# Patient Record
Sex: Female | Born: 1955
Health system: Southern US, Community
[De-identification: ages and names within clinical notes are randomized; demographics above are authoritative.]

## PROBLEM LIST (undated history)

## (undated) DIAGNOSIS — G8929 Other chronic pain: Secondary | ICD-10-CM

## (undated) DIAGNOSIS — K6389 Other specified diseases of intestine: Secondary | ICD-10-CM

## (undated) DIAGNOSIS — K589 Irritable bowel syndrome without diarrhea: Secondary | ICD-10-CM

## (undated) DIAGNOSIS — E7800 Pure hypercholesterolemia, unspecified: Secondary | ICD-10-CM

## (undated) DIAGNOSIS — K859 Acute pancreatitis without necrosis or infection, unspecified: Secondary | ICD-10-CM

## (undated) DIAGNOSIS — R51 Headache: Secondary | ICD-10-CM

## (undated) DIAGNOSIS — E538 Deficiency of other specified B group vitamins: Secondary | ICD-10-CM

## (undated) DIAGNOSIS — R519 Headache, unspecified: Secondary | ICD-10-CM

## (undated) DIAGNOSIS — D649 Anemia, unspecified: Secondary | ICD-10-CM

## (undated) DIAGNOSIS — F0390 Unspecified dementia without behavioral disturbance: Secondary | ICD-10-CM

## (undated) DIAGNOSIS — F32A Depression, unspecified: Secondary | ICD-10-CM

## (undated) DIAGNOSIS — I10 Essential (primary) hypertension: Secondary | ICD-10-CM

## (undated) DIAGNOSIS — M1990 Unspecified osteoarthritis, unspecified site: Secondary | ICD-10-CM

## (undated) DIAGNOSIS — Z800 Family history of malignant neoplasm of digestive organs: Secondary | ICD-10-CM

## (undated) DIAGNOSIS — F329 Major depressive disorder, single episode, unspecified: Secondary | ICD-10-CM

## (undated) HISTORY — DX: Anemia, unspecified: D64.9

## (undated) HISTORY — DX: Other specified diseases of intestine: K63.89

## (undated) HISTORY — DX: Major depressive disorder, single episode, unspecified: F32.9

## (undated) HISTORY — DX: Irritable bowel syndrome, unspecified: K58.9

## (undated) HISTORY — PX: ABDOMINAL HYSTERECTOMY: SHX81

## (undated) HISTORY — PX: APPENDECTOMY: SHX54

## (undated) HISTORY — DX: Depression, unspecified: F32.A

## (undated) HISTORY — DX: Other chronic pain: G89.29

## (undated) HISTORY — DX: Pure hypercholesterolemia, unspecified: E78.00

## (undated) HISTORY — DX: Family history of malignant neoplasm of digestive organs: Z80.0

## (undated) HISTORY — DX: Acute pancreatitis without necrosis or infection, unspecified: K85.90

## (undated) HISTORY — DX: Deficiency of other specified B group vitamins: E53.8

## (undated) HISTORY — DX: Headache, unspecified: R51.9

## (undated) HISTORY — DX: Essential (primary) hypertension: I10

## (undated) HISTORY — DX: Unspecified osteoarthritis, unspecified site: M19.90

## (undated) HISTORY — DX: Headache: R51

---

## 1999-04-26 ENCOUNTER — Encounter: Payer: Self-pay | Admitting: Internal Medicine

## 1999-04-26 ENCOUNTER — Ambulatory Visit (HOSPITAL_COMMUNITY): Admission: RE | Admit: 1999-04-26 | Discharge: 1999-04-26 | Payer: Self-pay | Admitting: Internal Medicine

## 1999-05-02 ENCOUNTER — Ambulatory Visit (HOSPITAL_COMMUNITY): Admission: RE | Admit: 1999-05-02 | Discharge: 1999-05-02 | Payer: Self-pay | Admitting: Internal Medicine

## 1999-05-02 ENCOUNTER — Encounter: Payer: Self-pay | Admitting: Internal Medicine

## 2000-10-08 ENCOUNTER — Ambulatory Visit (HOSPITAL_COMMUNITY): Admission: RE | Admit: 2000-10-08 | Discharge: 2000-10-08 | Payer: Self-pay | Admitting: Internal Medicine

## 2000-10-08 ENCOUNTER — Encounter: Payer: Self-pay | Admitting: Internal Medicine

## 2002-04-22 ENCOUNTER — Encounter: Admission: RE | Admit: 2002-04-22 | Discharge: 2002-04-22 | Payer: Self-pay | Admitting: Internal Medicine

## 2002-04-22 ENCOUNTER — Encounter: Payer: Self-pay | Admitting: Internal Medicine

## 2002-05-19 ENCOUNTER — Ambulatory Visit (HOSPITAL_COMMUNITY): Admission: RE | Admit: 2002-05-19 | Discharge: 2002-05-19 | Payer: Self-pay | Admitting: Internal Medicine

## 2002-05-19 ENCOUNTER — Encounter: Payer: Self-pay | Admitting: Internal Medicine

## 2003-06-07 ENCOUNTER — Emergency Department (HOSPITAL_COMMUNITY): Admission: AD | Admit: 2003-06-07 | Discharge: 2003-06-07 | Payer: Self-pay | Admitting: Family Medicine

## 2003-06-24 ENCOUNTER — Ambulatory Visit (HOSPITAL_COMMUNITY): Admission: RE | Admit: 2003-06-24 | Discharge: 2003-06-24 | Payer: Self-pay | Admitting: Internal Medicine

## 2004-02-12 ENCOUNTER — Encounter: Admission: RE | Admit: 2004-02-12 | Discharge: 2004-02-12 | Payer: Self-pay | Admitting: Internal Medicine

## 2004-12-05 ENCOUNTER — Ambulatory Visit (HOSPITAL_COMMUNITY): Admission: RE | Admit: 2004-12-05 | Discharge: 2004-12-05 | Payer: Self-pay | Admitting: Internal Medicine

## 2004-12-08 ENCOUNTER — Ambulatory Visit (HOSPITAL_COMMUNITY): Admission: RE | Admit: 2004-12-08 | Discharge: 2004-12-08 | Payer: Self-pay | Admitting: Neurosurgery

## 2004-12-12 ENCOUNTER — Encounter: Admission: RE | Admit: 2004-12-12 | Discharge: 2005-01-13 | Payer: Self-pay | Admitting: Neurosurgery

## 2006-02-13 ENCOUNTER — Ambulatory Visit (HOSPITAL_COMMUNITY): Admission: RE | Admit: 2006-02-13 | Discharge: 2006-02-13 | Payer: Self-pay | Admitting: Internal Medicine

## 2006-02-21 ENCOUNTER — Encounter: Admission: RE | Admit: 2006-02-21 | Discharge: 2006-02-21 | Payer: Self-pay | Admitting: Internal Medicine

## 2006-02-27 ENCOUNTER — Ambulatory Visit: Payer: Self-pay | Admitting: Gastroenterology

## 2006-02-28 ENCOUNTER — Ambulatory Visit: Payer: Self-pay | Admitting: Gastroenterology

## 2006-02-28 ENCOUNTER — Encounter: Payer: Self-pay | Admitting: Gastroenterology

## 2006-03-02 ENCOUNTER — Ambulatory Visit: Payer: Self-pay | Admitting: Gastroenterology

## 2006-03-09 ENCOUNTER — Ambulatory Visit: Payer: Self-pay | Admitting: Gastroenterology

## 2006-03-14 ENCOUNTER — Ambulatory Visit: Payer: Self-pay | Admitting: Gastroenterology

## 2006-03-16 ENCOUNTER — Ambulatory Visit: Payer: Self-pay | Admitting: Gastroenterology

## 2006-03-23 ENCOUNTER — Ambulatory Visit: Payer: Self-pay | Admitting: Gastroenterology

## 2006-04-13 ENCOUNTER — Ambulatory Visit: Payer: Self-pay | Admitting: Gastroenterology

## 2006-04-17 ENCOUNTER — Ambulatory Visit (HOSPITAL_COMMUNITY): Admission: RE | Admit: 2006-04-17 | Discharge: 2006-04-17 | Payer: Self-pay | Admitting: Gastroenterology

## 2006-05-29 ENCOUNTER — Ambulatory Visit: Payer: Self-pay | Admitting: Gastroenterology

## 2007-09-19 ENCOUNTER — Ambulatory Visit (HOSPITAL_COMMUNITY): Admission: RE | Admit: 2007-09-19 | Discharge: 2007-09-19 | Payer: Self-pay | Admitting: Internal Medicine

## 2007-09-20 ENCOUNTER — Ambulatory Visit (HOSPITAL_COMMUNITY): Admission: RE | Admit: 2007-09-20 | Discharge: 2007-09-20 | Payer: Self-pay | Admitting: Internal Medicine

## 2008-06-01 ENCOUNTER — Ambulatory Visit (HOSPITAL_COMMUNITY): Admission: RE | Admit: 2008-06-01 | Discharge: 2008-06-01 | Payer: Self-pay | Admitting: Internal Medicine

## 2008-08-27 ENCOUNTER — Emergency Department (HOSPITAL_COMMUNITY): Admission: EM | Admit: 2008-08-27 | Discharge: 2008-08-28 | Payer: Self-pay | Admitting: *Deleted

## 2008-10-22 ENCOUNTER — Ambulatory Visit (HOSPITAL_COMMUNITY): Admission: RE | Admit: 2008-10-22 | Discharge: 2008-10-22 | Payer: Self-pay | Admitting: Internal Medicine

## 2009-05-20 ENCOUNTER — Emergency Department (HOSPITAL_COMMUNITY): Admission: EM | Admit: 2009-05-20 | Discharge: 2009-05-20 | Payer: Self-pay | Admitting: Family Medicine

## 2009-10-11 ENCOUNTER — Telehealth: Payer: Self-pay | Admitting: Gastroenterology

## 2009-10-11 ENCOUNTER — Ambulatory Visit: Payer: Self-pay | Admitting: Gastroenterology

## 2009-10-11 DIAGNOSIS — E538 Deficiency of other specified B group vitamins: Secondary | ICD-10-CM

## 2009-10-11 DIAGNOSIS — E785 Hyperlipidemia, unspecified: Secondary | ICD-10-CM

## 2009-10-11 DIAGNOSIS — K219 Gastro-esophageal reflux disease without esophagitis: Secondary | ICD-10-CM

## 2009-10-11 DIAGNOSIS — M1990 Unspecified osteoarthritis, unspecified site: Secondary | ICD-10-CM | POA: Insufficient documentation

## 2009-10-11 DIAGNOSIS — K5730 Diverticulosis of large intestine without perforation or abscess without bleeding: Secondary | ICD-10-CM

## 2009-10-11 DIAGNOSIS — I10 Essential (primary) hypertension: Secondary | ICD-10-CM | POA: Insufficient documentation

## 2009-10-12 ENCOUNTER — Telehealth: Payer: Self-pay | Admitting: Physician Assistant

## 2009-10-12 LAB — CONVERTED CEMR LAB
BUN: 26 mg/dL — ABNORMAL HIGH (ref 6–23)
CO2: 31 meq/L (ref 19–32)
Calcium: 9 mg/dL (ref 8.4–10.5)
Eosinophils Relative: 3.3 % (ref 0.0–5.0)
Glucose, Bld: 98 mg/dL (ref 70–99)
Lymphs Abs: 1.9 10*3/uL (ref 0.7–4.0)
MCHC: 32.9 g/dL (ref 30.0–36.0)
Monocytes Absolute: 0.5 10*3/uL (ref 0.1–1.0)
Neutro Abs: 5 10*3/uL (ref 1.4–7.7)
Platelets: 249 10*3/uL (ref 150.0–400.0)
Potassium: 4.2 meq/L (ref 3.5–5.1)
RDW: 12.1 % (ref 11.5–14.6)
WBC: 7.7 10*3/uL (ref 4.5–10.5)

## 2009-10-13 ENCOUNTER — Ambulatory Visit (HOSPITAL_COMMUNITY): Admission: RE | Admit: 2009-10-13 | Discharge: 2009-10-13 | Payer: Self-pay | Admitting: Gastroenterology

## 2009-10-18 ENCOUNTER — Telehealth: Payer: Self-pay | Admitting: Gastroenterology

## 2009-10-28 ENCOUNTER — Ambulatory Visit (HOSPITAL_COMMUNITY): Admission: RE | Admit: 2009-10-28 | Discharge: 2009-10-28 | Payer: Self-pay | Admitting: Internal Medicine

## 2009-11-23 ENCOUNTER — Encounter (INDEPENDENT_AMBULATORY_CARE_PROVIDER_SITE_OTHER): Payer: Self-pay | Admitting: *Deleted

## 2009-12-21 ENCOUNTER — Ambulatory Visit: Payer: Self-pay | Admitting: Gastroenterology

## 2009-12-21 ENCOUNTER — Encounter (INDEPENDENT_AMBULATORY_CARE_PROVIDER_SITE_OTHER): Payer: Self-pay | Admitting: *Deleted

## 2009-12-21 LAB — CONVERTED CEMR LAB
ALT: 22 units/L (ref 0–35)
Albumin: 3.8 g/dL (ref 3.5–5.2)
Basophils Absolute: 0.1 10*3/uL (ref 0.0–0.1)
Basophils Relative: 0.8 % (ref 0.0–3.0)
CO2: 31 meq/L (ref 19–32)
Calcium: 9.5 mg/dL (ref 8.4–10.5)
Creatinine, Ser: 1.1 mg/dL (ref 0.4–1.2)
Eosinophils Absolute: 0.3 10*3/uL (ref 0.0–0.7)
Eosinophils Relative: 5.2 % — ABNORMAL HIGH (ref 0.0–5.0)
Ferritin: 88.9 ng/mL (ref 10.0–291.0)
HCT: 38.7 % (ref 36.0–46.0)
Lymphocytes Relative: 24.5 % (ref 12.0–46.0)
Monocytes Absolute: 0.5 10*3/uL (ref 0.1–1.0)
Monocytes Relative: 7 % (ref 3.0–12.0)
Neutrophils Relative %: 62.5 % (ref 43.0–77.0)
Potassium: 4.3 meq/L (ref 3.5–5.1)
RDW: 13.7 % (ref 11.5–14.6)
Sed Rate: 29 mm/hr — ABNORMAL HIGH (ref 0–22)
Sodium: 141 meq/L (ref 135–145)
TSH: 1.03 microintl units/mL (ref 0.35–5.50)
Total Bilirubin: 0.4 mg/dL (ref 0.3–1.2)
WBC: 6.8 10*3/uL (ref 4.5–10.5)

## 2009-12-27 ENCOUNTER — Ambulatory Visit: Payer: Self-pay | Admitting: Gastroenterology

## 2009-12-29 ENCOUNTER — Encounter: Payer: Self-pay | Admitting: Gastroenterology

## 2010-01-25 ENCOUNTER — Ambulatory Visit: Payer: Self-pay | Admitting: Gastroenterology

## 2010-01-25 DIAGNOSIS — D509 Iron deficiency anemia, unspecified: Secondary | ICD-10-CM | POA: Insufficient documentation

## 2010-02-24 ENCOUNTER — Ambulatory Visit: Payer: Self-pay | Admitting: Gastroenterology

## 2010-03-02 LAB — CONVERTED CEMR LAB
Basophils Absolute: 0 10*3/uL (ref 0.0–0.1)
Iron: 35 ug/dL — ABNORMAL LOW (ref 42–145)
Lymphocytes Relative: 25.8 % (ref 12.0–46.0)
MCHC: 34.4 g/dL (ref 30.0–36.0)
Monocytes Relative: 7.6 % (ref 3.0–12.0)
Neutrophils Relative %: 56.9 % (ref 43.0–77.0)
Platelets: 233 10*3/uL (ref 150.0–400.0)
RBC: 3.95 M/uL (ref 3.87–5.11)
Saturation Ratios: 14.1 % — ABNORMAL LOW (ref 20.0–50.0)
Transferrin: 177.3 mg/dL — ABNORMAL LOW (ref 212.0–360.0)
WBC: 6.1 10*3/uL (ref 4.5–10.5)

## 2010-07-01 ENCOUNTER — Encounter
Admission: RE | Admit: 2010-07-01 | Discharge: 2010-07-01 | Payer: Self-pay | Source: Home / Self Care | Attending: Internal Medicine | Admitting: Internal Medicine

## 2010-08-25 NOTE — Procedures (Signed)
Summary: Colonoscopy  Patient: [name] Note: All result statuses are Final unless otherwise noted.  Tests: (1) Colonoscopy (COL)   COL Colonoscopy           DONE     [facility]     [address]     [place], [place]  [address]           COLONOSCOPY PROCEDURE REPORT           PATIENT:  Helen Cabrera, Helen Cabrera  MR#:  [id number]     BIRTHDATE:  03-23-1956, 53 yrs. old  GENDER:  female     ENDOSCOPIST:  Vania Rea. [name], MD, [facility]     REF. BY:     PROCEDURE DATE:  12/27/2009     PROCEDURE:  Colonoscopy with biopsy     ASA CLASS:  Class II     INDICATIONS:  abdominal pain, unexplained diarrhea, weight loss     MEDICATIONS:   Fentanyl 75 mcg IV, Versed 8 mg IV           DESCRIPTION OF PROCEDURE:   After the risks benefits and     alternatives of the procedure were thoroughly explained, informed     consent was obtained.  Digital rectal exam was performed and     revealed no abnormalities.   The LB [id number] endoscope     was introduced through the anus and advanced to the cecum, which     was identified by both the appendix and ileocecal valve, without     limitations.  The quality of the prep was excellent, using     MoviPrep.  The instrument was then slowly withdrawn as the colon     was fully examined.     <<PROCEDUREIMAGES>>           FINDINGS:  Scattered diverticula were found in the sigmoid to     descending colon segments.  No polyps or cancers were seen. random     biopsies done.   Retroflexed views in the rectum revealed no     abnormalities.    The scope was then withdrawn from the patient     and the procedure completed.           COMPLICATIONS:  None     ENDOSCOPIC IMPRESSION:     1) Diverticula, scattered in the sigmoid to descending colon     segments     2) No polyps or cancers     probable IBS.     RECOMMENDATIONS:     1) Upper endoscopy will be scheduled     2) Await biopsy results     REPEAT EXAM:  No        ______________________________     Vania Rea. [name], MD, Clementeen Graham           CC:  Lucky Cowboy, MD           n.     Rosalie Doctor:   Vania Rea. Jordon Kristiansen at 12/27/2009 03:52 PM           Darlys Gales, [id number]  Note: An exclamation mark (!) indicates a result that was not dispersed into the flowsheet. Document Creation Date: 12/27/2009 3:52 PM _______________________________________________________________________  (1) Order result status: Final Collection or observation date-time: 12/27/2009 15:46 Requested date-time:  Receipt date-time:  Reported date-time:  Referring Physician:   Ordering Physician: Sheryn Bison [phone]) Specimen Source:  Source: Launa Grill Order Number: [id number] Lab site:

## 2010-08-25 NOTE — Letter (Signed)
Summary: [facility] Instructions  [facility]  [address] [place], [place] [address]   Phone: [phone]  Fax: [phone]       Helen Cabrera    Nov 11, 1955    MRN: [id number]        Procedure Day /Date: Monday,  12/27/09     Arrival Time: 2:30       Procedure Time: 3:30     Location of Procedure:                    [facility] (4th Floor)                        PREPARATION FOR COLONOSCOPY WITH MOVIPREP   Starting 5 days prior to your procedure 12/22/09 do not eat nuts, seeds, popcorn, corn, beans, peas,  salads, or any raw vegetables.  Do not take any fiber supplements (e.g. Metamucil, Citrucel, and Benefiber).  THE DAY BEFORE YOUR PROCEDURE         DATE: 12/26/09  DAY: Sunday  1.  Drink clear liquids the entire day-NO SOLID FOOD  2.  Do not drink anything colored red or purple.  Avoid juices with pulp.  No orange juice.  3.  Drink at least 64 oz. (8 glasses) of fluid/clear liquids during the day to prevent dehydration and help the prep work efficiently.  CLEAR LIQUIDS INCLUDE: Water Jello Ice Popsicles Tea (sugar ok, no milk/cream) Powdered fruit flavored drinks Coffee (sugar ok, no milk/cream) Gatorade Juice: apple, white grape, white cranberry  Lemonade Clear bullion, consomm, broth Carbonated beverages (any kind) Strained chicken noodle soup Hard Candy                             4.  In the morning, mix first dose of MoviPrep solution:    Empty 1 Pouch A and 1 Pouch B into the disposable container    Add lukewarm drinking water to the top line of the container. Mix to dissolve    Refrigerate (mixed solution should be used within 24 hrs)  5.  Begin drinking the prep at 5:00 p.m. The MoviPrep container is divided by 4 marks.   Every 15 minutes drink the solution down to the next mark (approximately 8 oz) until the full liter is complete.   6.  Follow completed prep with 16 oz of clear liquid of your choice (Nothing red or  purple).  Continue to drink clear liquids until bedtime.  7.  Before going to bed, mix second dose of MoviPrep solution:    Empty 1 Pouch A and 1 Pouch B into the disposable container    Add lukewarm drinking water to the top line of the container. Mix to dissolve    Refrigerate  THE DAY OF YOUR PROCEDURE      DATE: 12/27/09  DAY: Monday  Beginning at 10:30 a.m. (5 hours before procedure):         1. Every 15 minutes, drink the solution down to the next mark (approx 8 oz) until the full liter is complete.  2. Follow completed prep with 16 oz. of clear liquid of your choice.    3. You may drink clear liquids until 1:30 (2 HOURS BEFORE PROCEDURE).   MEDICATION INSTRUCTIONS  Unless otherwise instructed, you should take regular prescription medications with a small sip of water   as early as possible the morning  of your procedure.                  OTHER INSTRUCTIONS  You will need a responsible adult at least 56 years of age to accompany you and drive you home.   This person must remain in the waiting room during your procedure.  Wear loose fitting clothing that is easily removed.  Leave jewelry and other valuables at home.  However, you may wish to bring a book to read or  an iPod/MP3 player to listen to music as you wait for your procedure to start.  Remove all body piercing jewelry and leave at home.  Total time from sign-in until discharge is approximately 2-3 hours.  You should go home directly after your procedure and rest.  You can resume normal activities the  day after your procedure.  The day of your procedure you should not:   Drive   Make legal decisions   Operate machinery   Drink alcohol   Return to work  You will receive specific instructions about eating, activities and medications before you leave.    The above instructions have been reviewed and explained to me by   _______________________    I fully understand and can verbalize  these instructions _____________________________ Date _________

## 2010-08-25 NOTE — Assessment & Plan Note (Signed)
Summary: nauseated/abd pain--ch.   History of Present Illness Visit Type: Follow-up Visit Primary GI MD: Sheryn Bison MD FACP FAGA Primary Provider: Lucky Cowboy, MD Chief Complaint: abdominal pain and nausea for several weeks, pt was seen in March and given phenergan which she states helps, but makes her legs ache and makes her restless so she does not take, symptoms eased up a bit, but have come back History of Present Illness:   Pleasant 55 year old Caucasian female with a past history of chronic IBS now presents with 6 months of discomfort over the entire abdomen-clonic area which is a crampy type pain and also a chronic dull aching discomfort with associated nausea, all worse with being fried or spicy foods. She also complains of early satiety but no emesis. Her bowel movements are fairly regular, but she has had one episode of hematochezia. She denies melanotic stools. Meds include meloxicam at that 50 mg daily for low back pain, Ultram, with 2 hyosamine tablets twice a day which gave her mild improvement. She has abdominal gas and bloating in the last 20 pound weight loss. She gives no history of pancreatitis, hepatitis, abuse of alcohol or cigarettes.  Recent abdominal ultrasound showed no evidence of cholelithiasis, but the pancreas is poorly visualized. She is on daily Nexium 40 mg a day, B12 shots, and Klonopin 0.5 mg t.i.d. for chronic anxiety syndrome. She denies severe anxiety or depression at this point. Previous endoscopy colonoscopy were done 4 years ago. Noted is a family history of gastric carcinoma and recurrent colon polyps. Previous small bowel series was completed in September 2007 was unremarkable. Patient has had multiple cesarean operations. She thinks she also has had appendectomy.   GI Review of Systems    Reports abdominal pain, acid reflux, belching, heartburn, loss of appetite, and  nausea.     Location of  Abdominal pain: generalized.    Denies chest pain,  dysphagia with liquids, dysphagia with solids, vomiting, vomiting blood, weight loss, and  weight gain.      Reports irritable bowel syndrome.     Denies anal fissure, black tarry stools, change in bowel habit, constipation, diarrhea, diverticulosis, fecal incontinence, heme positive stool, hemorrhoids, jaundice, light color stool, liver problems, rectal bleeding, and  rectal pain.    Current Medications (verified): 1)  Lipitor 80 Mg Tabs (Atorvastatin Calcium) .... Take 1 Tablet By Mouth Once A Day 2)  Hydrochlorothiazide 25 Mg Tabs (Hydrochlorothiazide) .... Take 1 Tablet By Mouth Once A Day 3)  Atenolol 100 Mg Tabs (Atenolol) .... Take 1 Tablet By Mouth Once A Day 4)  Nexium 40 Mg Cpdr (Esomeprazole Magnesium) .... Take 1 Tablet By Mouth Once A Day 5)  Meloxicam 15 Mg Tabs (Meloxicam) .... Take 1 Tablet By Mouth Once A Day 6)  Klonopin 1 Mg Tabs (Clonazepam) .... Take 1/2 Tablet By Mouth Three Times A Day 7)  Cyanocobalamin 1000 Mcg/ml Soln (Cyanocobalamin) .... Inject 1 Ml Im Every 2 Weeks 8)  Vitamin D (125 Mg) .... Take 1 Tablet Every Monday, Tuesday and Wednesday 9)  Magnesium/calcium/zinc Tablets (400 Mg/1000mg /15 Mg) .... Take 1 Tablet By Mouth Once A Day 10)  Ultram 50 Mg Tabs (Tramadol Hcl) .... Take 1 Tablet By Mouth Every 6 Hours As Needed 11)  Promethazine Hcl 25 Mg Tabs (Promethazine Hcl) .... Take 1/2-1 Tablet By Mouth Every 6 Hours As Needed 12)  Hyoscyamine Sulfate Cr 0.375 Mg Xr12h-Tab (Hyoscyamine Sulfate) .... Take 2 Tablets By Mouth Two Times A Day  Allergies (verified): No  Known Drug Allergies  Past History:  Past medical history reviewed for relevance to current acute and chronic problems.  Past Medical History: Reviewed history from 10/11/2009 and no changes required. GERD IBS OSTEOARTHRITIS HTN HYPERLIPIDEMIA  Past Surgical History: Reviewed history from 10/11/2009 and no changes required. C-Section x 4 Appendectomy  Family History: Reviewed history  from 10/11/2009 and no changes required. Family History of Colon Cancer: Maternal Aunt Family History of Breast Cancer: Aunt Family History of Esophageal Cancer: Brother (in his 46's) Family History of Stomach Cancer:Brother Family History of Diabetes: Mother, Brother, Maternal Uncle Family History of Heart Disease: P, M Grandfather, P, M, Grandmother  Social History: Married, 2 boys, 2 girls [employer] Patient has never smoked.  Alcohol Use - yes-rare Daily Caffeine Use-minimal Illicit Drug Use - no Patient does not get regular exercise.   Review of Systems       The patient complains of back pain, fatigue, and night sweats.  The patient denies allergy/sinus, anemia, anxiety-new, arthritis/joint pain, blood in urine, breast changes/lumps, change in vision, confusion, cough, coughing up blood, depression-new, fainting, fever, headaches-new, hearing problems, heart murmur, heart rhythm changes, itching, menstrual pain, muscle pains/cramps, nosebleeds, pregnancy symptoms, shortness of breath, skin rash, sleeping problems, sore throat, swelling of feet/legs, swollen lymph glands, thirst - excessive, urination - excessive, urination changes/pain, urine leakage, and voice change.         history positive for chronic insomnia. She has had rather severe dry mouth associated with anticholinergic medications. Eyes:  Denies blurring, diplopia, irritation, discharge, vision loss, scotoma, eye pain, and photophobia. ENT:  Denies earache, ear discharge, tinnitus, decreased hearing, nasal congestion, loss of smell, nosebleeds, sore throat, hoarseness, and difficulty swallowing. CV:  Denies chest pains, angina, palpitations, syncope, dyspnea on exertion, orthopnea, PND, peripheral edema, and claudication. GI:  Complains of nausea and abdominal pain; denies difficulty swallowing, pain on swallowing, indigestion/heartburn, vomiting, vomiting blood, jaundice, gas/bloating, diarrhea, constipation, change in  bowel habits, bloody BM's, black BMs, and fecal incontinence. GU:  Denies urinary burning, blood in urine, nocturnal urination, urinary frequency, urinary incontinence, abnormal vaginal bleeding, amenorrhea, menorrhagia, vaginal discharge, pelvic pain, genital sores, painful intercourse, and decreased libido. MS:  Complains of low back pain; denies joint pain / LOM, joint swelling, joint stiffness, joint deformity, muscle weakness, muscle cramps, muscle atrophy, leg pain at night, leg pain with exertion, and shoulder pain / LOM hand / wrist pain (CTS). Derm:  Denies rash, itching, dry skin, hives, moles, warts, and unhealing ulcers. Neuro:  Denies weakness, paralysis, abnormal sensation, seizures, syncope, tremors, vertigo, transient blindness, frequent falls, frequent headaches, difficulty walking, headache, sciatica, radiculopathy other:, restless legs, memory loss, and confusion. Psych:  Denies depression, anxiety, memory loss, suicidal ideation, hallucinations, paranoia, phobia, and confusion. Endo:  Denies cold intolerance, heat intolerance, polydipsia, polyphagia, polyuria, unusual weight change, and hirsutism. Heme:  Denies bruising, bleeding, enlarged lymph nodes, and pagophagia.  Vital Signs:  Patient profile:   55 year old female Height:      64 inches Weight:      168 pounds BMI:     28.94 Pulse rate:   68 / minute Pulse rhythm:   regular BP sitting:   110 / 72  (left arm) Cuff size:   regular  Vitals Entered By: Francee Piccolo CMA Duncan Dull) (Dec 21, 2009 9:45 AM)  Physical Exam  General:  Well developed, well nourished, no acute distress.healthy appearing.   Head:  Normocephalic and atraumatic. Eyes:  PERRLA, no icterus.exam deferred to patient's ophthalmologist.  Neck:  Supple; no masses or thyromegaly. Lungs:  Clear throughout to auscultation. Heart:  Regular rate and rhythm; no murmurs, rubs,  or bruits. Abdomen:  large midline lower abdominal scar noted with some  abdominal wall deformity. I cannot appreciate definite organomegaly, masses, or localized tenderness. Bowel sounds are normal and there is no abdominal bruit. Rectal:  Normal exam.hemocult negative.  Hard solid stool which is guaiac negative noted. Msk:  Symmetrical with no gross deformities. Normal posture. Extremities:  No clubbing, cyanosis, edema or deformities noted. Neurologic:  Alert and  oriented x4;  grossly normal neurologically. Skin:  Intact without significant lesions or rashes. Cervical Nodes:  No significant cervical adenopathy. Axillary Nodes:  No significant axillary adenopathy. Inguinal Nodes:  No significant inguinal adenopathy. Psych:  Alert and cooperative. Normal mood and affect.   Impression & Recommendations:  Problem # 1:  ABDOMINAL PAIN, GENERALIZED (ICD-789.07) Assessment Deteriorated Probable IBS, but I were to about her continued pain despite treatment, lack of psychiatric worsening, associated significant weight loss, and has been several years since she had endoscopic evaluations which have been scheduled along with screening labs. Perhaps this patient has small bowel-colonic NSAID damage from meloxicam. She may need small bowel pill-camera exam. Have not change of medications at this point until her workup has been completed. Also, this patient may have intestinal adhesions related to her multiple previous cesarean sections. Consideration for laparoscopic examination also should be kept in mind. Orders: TLB-CBC Platelet - w/Differential (85025-CBCD) TLB-BMP (Basic Metabolic Panel-BMET) (80048-METABOL) TLB-Hepatic/Liver Function Pnl (80076-HEPATIC) TLB-TSH (Thyroid Stimulating Hormone) (84443-TSH) TLB-B12, Serum-Total ONLY (26712-W58) TLB-Ferritin (82728-FER) TLB-Folic Acid (Folate) (82746-FOL) TLB-IBC Pnl (Iron/FE;Transferrin) (83550-IBC) TLB-Magnesium (Mg) (83735-MG) TLB-Sedimentation Rate (ESR) (85652-ESR) TLB-IgA (Immunoglobulin A) (82784-IGA) T-Sprue  Panel (Celiac Disease Aby Eval) (83516x3/[address])  Problem # 2:  LOSS OF WEIGHT (ICD-783.21) Assessment: Deteriorated  Orders: TLB-CBC Platelet - w/Differential (85025-CBCD) TLB-BMP (Basic Metabolic Panel-BMET) (80048-METABOL) TLB-Hepatic/Liver Function Pnl (80076-HEPATIC) TLB-TSH (Thyroid Stimulating Hormone) (84443-TSH) TLB-B12, Serum-Total ONLY (09983-J82) TLB-Ferritin (82728-FER) TLB-Folic Acid (Folate) (82746-FOL) TLB-IBC Pnl (Iron/FE;Transferrin) (83550-IBC) TLB-Magnesium (Mg) (83735-MG) TLB-Sedimentation Rate (ESR) (85652-ESR) TLB-IgA (Immunoglobulin A) (82784-IGA) T-Sprue Panel (Celiac Disease Aby Eval) (83516x3/[address])  Problem # 3:  DIVERTICULOSIS-COLON (ICD-562.10) Assessment: Comment Only  Problem # 4:  ANXIETY (ICD-300.00) Assessment: Improved  Problem # 5:  VITAMIN B12 DEFICIENCY (ICD-266.2) Assessment: Unchanged Possible element of chronic bacterial overgrowth syndrome associated B12 deficiency.  Patient Instructions: 1)  Please go to the basement for lab work. 2)  You are scheduled for an endoscopy and a colonoscopy. 3)  The medication list was reviewed and reconciled.  All changed / newly prescribed medications were explained.  A complete medication list was provided to the patient / caregiver. 4)  Copy sent to : Dr. Lucky Cowboy. 5)  Please continue current medications.  6)  Colonoscopy and Flexible Sigmoidoscopy brochure given.  7)  Conscious Sedation brochure given.  8)  Upper Endoscopy brochure given.   Appended Document: nauseated/abd pain--ch.    Clinical Lists Changes  Medications: Added new medication of MOVIPREP 100 GM  SOLR (PEG-KCL-NACL-NASULF-NA ASC-C) As per prep instructions. - Signed Rx of MOVIPREP 100 GM  SOLR (PEG-KCL-NACL-NASULF-NA ASC-C) As per prep instructions.;  #1 x 0;  Signed;  Entered by: Ashok Cordia RN;  Authorized by: Mardella Layman MD [facility];  Method used: [facility] to [facility]  [phone]*, [address], [place], [place]  [address], Ph: [phone] or [phone], Fax: [phone] Orders: Added new Test order of Colon/Endo (Colon/Endo) - Signed    Prescriptions:  MOVIPREP 100 GM  SOLR (PEG-KCL-NACL-NASULF-NA ASC-C) As per prep instructions.  #1 x 0   Entered by:   Ashok Cordia RN   Authorized by:   Mardella Layman MD [facility]   Signed by:   Ashok Cordia RN on 12/21/2009   Method used:   [facility] to        [facility]  [phone]* (retail)       [address] [place], [place]  [address]       Ph: [phone] or [phone]       Fax: [phone]   RxID:   [id number]

## 2010-08-25 NOTE — Progress Notes (Signed)
Summary: triage  Phone Note Call from Patient Call back at Home Phone [phone]   Caller: Patient Call For: Dr. [name] Reason for Call: Talk to Nurse Summary of Call: pt reporting indigestion, "a lot of gas", pain in lower and upper abd... would like to be seen today Initial call taken by: Vallarie Mare,  October 11, 2009 9:38 AM  Follow-up for Phone Call        Appt sch with Amy Esterwood PA at 2:00 this pm.    Follow-up by: Ashok Cordia RN,  October 11, 2009 9:49 AM

## 2010-08-25 NOTE — Assessment & Plan Note (Signed)
Summary: Post Proc 1 mo/dfs   History of Present Illness Visit Type: Follow-up Visit Primary GI MD: Sheryn Bison MD FACP FAGA Primary Provider: Lucky Cowboy, MD Requesting Provider: n/a Chief Complaint: 1 month follow up after procedure, patient still c/o nausea in the mornings upon waking History of Present Illness:   No gastrointestinal symptoms except for mild nausea and occasional gas and bloating. She is on Nexium 40 mg twice a day, t.i.d. Librax, p.r.n. Klonopin, and iron replacement therapy. GI workup has been negative including endoscopy, colonoscopy, ultrasound, and screening labs. She is on regular insulin therapy. Goal of iron deficiency she was placed on iron replacement therapy which she is tolerating well.   GI Review of Systems    Reports nausea.      Denies abdominal pain, acid reflux, belching, bloating, chest pain, dysphagia with liquids, dysphagia with solids, heartburn, loss of appetite, vomiting, vomiting blood, weight loss, and  weight gain.        Denies anal fissure, black tarry stools, change in bowel habit, constipation, diarrhea, diverticulosis, fecal incontinence, heme positive stool, hemorrhoids, irritable bowel syndrome, jaundice, light color stool, liver problems, rectal bleeding, and  rectal pain.    Current Medications (verified): 1)  Lipitor 80 Mg Tabs (Atorvastatin Calcium) .... Take 1 Tablet By Mouth Once A Day 2)  Hydrochlorothiazide 25 Mg Tabs (Hydrochlorothiazide) .... Take 1 Tablet By Mouth Once A Day 3)  Atenolol 100 Mg Tabs (Atenolol) .... Take 1 Tablet By Mouth Once A Day 4)  Nexium 40 Mg Cpdr (Esomeprazole Magnesium) .Marland Kitchen.. 1 By Mouth Two Times A Day 5)  Meloxicam 15 Mg Tabs (Meloxicam) .... Take 1 Tablet By Mouth Once A Day 6)  Klonopin 1 Mg Tabs (Clonazepam) .... Take 1/2 Tablet By Mouth Three Times A Day 7)  Cyanocobalamin 1000 Mcg/ml Soln (Cyanocobalamin) .... Inject 1 Ml Im Every 2 Weeks 8)  Vitamin D (125 Mg) .... Take 1 Tablet Every  Monday, Tuesday and Wednesday 9)  Magnesium/calcium/zinc Tablets (400 Mg/1000mg /15 Mg) .... Take 1 Tablet By Mouth Once A Day 10)  Hyoscyamine Sulfate Cr 0.375 Mg Xr12h-Tab (Hyoscyamine Sulfate) .... Take 2 Tablets By Mouth Two Times A Day 11)  Librax 2.5-5 Mg  Caps (Clidinium-Chlordiazepoxide) .... Three Times A Day Ac 12)  Ferrous Sulfate 325 (65 Fe) Mg Tabs (Ferrous Sulfate) .Marland Kitchen.. 1 By Mouth Once Daily  Allergies (verified): 1)  ! * Promethazine  Past History:  Family History: Last updated: 10/11/2009 Family History of Colon Cancer: Maternal Aunt Family History of Breast Cancer: Aunt Family History of Esophageal Cancer: Brother (in his 62's) Family History of Stomach Cancer:Brother Family History of Diabetes: Mother, Brother, Maternal Uncle Family History of Heart Disease: P, M Grandfather, P, M, Grandmother  Social History: Last updated: 12/21/2009 Married, 2 boys, 2 girls [employer] Patient has never smoked.  Alcohol Use - yes-rare Daily Caffeine Use-minimal Illicit Drug Use - no Patient does not get regular exercise.   Past medical, surgical, family and social histories (including risk factors) reviewed for relevance to current acute and chronic problems.  Past Medical History: Reviewed history from 10/11/2009 and no changes required. GERD IBS OSTEOARTHRITIS HTN HYPERLIPIDEMIA  Past Surgical History: Reviewed history from 10/11/2009 and no changes required. C-Section x 4 Appendectomy  Family History: Reviewed history from 10/11/2009 and no changes required. Family History of Colon Cancer: Maternal Aunt Family History of Breast Cancer: Aunt Family History of Esophageal Cancer: Brother (in his 66's) Family History of Stomach Cancer:Brother Family History of Diabetes: Mother,  Brother, Maternal Uncle Family History of Heart Disease: P, M Grandfather, P, M, Grandmother  Social History: Reviewed history from 12/21/2009 and no changes required. Married, 2 boys,  2 girls [employer] Patient has never smoked.  Alcohol Use - yes-rare Daily Caffeine Use-minimal Illicit Drug Use - no Patient does not get regular exercise.   Review of Systems       The patient complains of fatigue.  The patient denies allergy/sinus, anemia, anxiety-new, arthritis/joint pain, back pain, blood in urine, breast changes/lumps, change in vision, confusion, cough, coughing up blood, depression-new, fainting, fever, headaches-new, hearing problems, heart murmur, heart rhythm changes, itching, menstrual pain, muscle pains/cramps, night sweats, nosebleeds, pregnancy symptoms, shortness of breath, skin rash, sleeping problems, sore throat, swelling of feet/legs, swollen lymph glands, thirst - excessive , urination - excessive , urination changes/pain, urine leakage, vision changes, and voice change.    Vital Signs:  Patient profile:   55 year old female Height:      64 inches Weight:      168 pounds BMI:     28.94 BSA:     1.82 Pulse rate:   58 / minute Pulse rhythm:   regular BP sitting:   110 / 72  (left arm)  Vitals Entered By: Merri Ray CMA Duncan Dull) (January 25, 2010 9:47 AM)  Physical Exam  General:  Well developed, well nourished, no acute distress.healthy appearing.   Head:  Normocephalic and atraumatic. Eyes:  PERRLA, no icterus.exam deferred to patient's ophthalmologist.   Abdomen:  Soft, nontender and nondistended. No masses, hepatosplenomegaly or hernias noted. Normal bowel sounds. Psych:  Alert and cooperative. Normal mood and affect.   Impression & Recommendations:  Problem # 1:  LOSS OF WEIGHT (ICD-783.21) Assessment Improved  Problem # 2:  ABDOMINAL PAIN, GENERALIZED (ICD-789.07) Assessment: Improved Workup consistent with IBS---continue Librax as needed---try [employer]' Colon Health probiotic therapy twice a day.  Problem # 3:  DIVERTICULOSIS-COLON (ICD-562.10) Assessment: Improved high-fiber diet as tolerated.  Problem # 4:  VITAMIN B12  DEFICIENCY (ICD-266.2) Assessment: Improved Continue from her placement therapy.  Problem # 5:  ANEMIA-IRON DEFICIENCY (ICD-280.9) Assessment: Improved CBC and iron levels after another month of oral iron replacement therapy. Workup for celiac disease has been negative.  Patient Instructions: 1)  Copy sent to : Dr. Lucky Cowboy 2)  Please continue current medications.  3)  CBC and iron levels in one month 4)  Follow [employer] Colon Health tablets twice a day 5)  Please schedule a follow-up appointment as needed.  6)  Diet should be high in fiber ( fruits, vegetables, whole grains) but low in residue. Drink at least eight (8) glasses of water a day.  7)  Avoid foods high in acid content ( tomatoes, citrus juices, spicy foods) . Avoid eating within 3 to 4 hours of lying down or before exercising. Do not over eat; try smaller more frequent meals. Elevate head of bed four inches when sleeping.   Appended Document: Post Proc 1 mo/dfs    Clinical Lists Changes  Medications: Added new medication of PHILLIPS COLON HEALTH  CAPS (PROBIOTIC PRODUCT) two times a day

## 2010-08-25 NOTE — Letter (Signed)
Summary: New Patient letter  [facility]  [address] [place], [place] [address]   Phone: [phone]  Fax: [phone]       11/23/2009 MRN: [id number]  [facility] [place] [address] [place], [place]  [address]  Dear Helen Cabrera,  Welcome to the Gastroenterology Division at [facility].    You are scheduled to see Dr.  [name] on 12-21-09 at 9:30a.m. on the 3rd floor at [facility], [address]  We ask that you try to arrive at our office 15 minutes prior to your appointment time to allow for check-in.  We would like you to complete the enclosed self-administered evaluation form prior to your visit and bring it with you on the day of your appointment.  We will review it with you.  Also, please bring a complete list of all your medications or, if you prefer, bring the medication bottles and we will list them.  Please bring your insurance card so that we may make a copy of it.  If your insurance requires a referral to see a specialist, please bring your referral form from your primary care physician.  Co-payments are due at the time of your visit and may be paid by cash, check or credit card.     Your office visit will consist of a consult with your physician (includes a physical exam), any laboratory testing he/she may order, scheduling of any necessary diagnostic testing (e.g. x-ray, ultrasound, CT-scan), and scheduling of a procedure (e.g. Endoscopy, Colonoscopy) if required.  Please allow enough time on your schedule to allow for any/all of these possibilities.    If you cannot keep your appointment, please call [phone] to cancel or reschedule prior to your appointment date.  This allows [place] the opportunity to schedule an appointment for another patient in need of care.  If you do not cancel or reschedule by 5 p.m. the business day prior to your appointment date, you will be charged a $50.00 late cancellation/no-show fee.    Thank you for choosing  [facility] for your medical needs.  We appreciate the opportunity to care for you.  Please visit [place] at our website  to learn more about our practice.                     Sincerely,                                                             The Gastroenterology Division

## 2010-08-25 NOTE — Progress Notes (Signed)
Summary: result  Phone Note Call from Patient Call back at Work Phone [phone]   Caller: Patient Call For: [name] Reason for Call: Talk to Nurse, Lab or Test Results Summary of Call: Patient would like to ultrasound results Initial call taken by: Tawni Levy,  October 18, 2009 9:20 AM  Follow-up for Phone Call        Pt notified of results. Follow-up by: Ashok Cordia RN,  October 18, 2009 9:40 AM

## 2010-08-25 NOTE — Letter (Signed)
Summary: Patient [facility] Biopsy Results  [facility]  [address] [place], [place] [address]   Phone: [phone]  Fax: [phone]        December 29, 2009 MRN: [id number]    [facility] [address] [place], [place]  [address]    Dear Ms. Collister,  I am pleased to inform you that the biopsies taken during your recent endoscopic examination did not show any evidence of cancer upon pathologic examination.  Additional information/recommendations:  __No further action is needed at this time.  Please follow-up with      your primary care physician for your other healthcare needs.  __ Please call [phone] to schedule a return visit to review      your condition.  _x_ Continue with the treatment plan as outlined on the day of your      exam.  __ You should have a repeat endoscopic examination for this problem              in _ months/years.   Please call us if you are having persistent problems or have questions about your condition that have not been fully answered at this time.  Sincerely,  Mardella Layman MD [facility]  This letter has been electronically signed by your physician.  Appended Document: Patient Notice-Endo Biopsy Results letter mailed.

## 2010-08-25 NOTE — Assessment & Plan Note (Signed)
Summary: Abd pain/dfs   History of Present Illness Visit Type: Initial Visit Primary GI MD: Sheryn Bison MD FACP FAGA Primary Provider: Lucky Cowboy, MD Chief Complaint: Patient c/o several days generalized abdominal discomfort along with some midsternal chest burning. She also c/o nausea but no vomiting. She has had increased bloating and gas as well as increased belching. She states that she has increased frequency of her stools but no change as far as constipation or diarrhea. She has not noted any blood. History of Present Illness:   55 YO FEMALE WITH HX OF IBS. SHE IS KNOWN TO DR. PATTERSON,LAST SEEN IN 2007.COLONOSCOPY 8/07 SHOWED MILD DIVERTICULOSIS.  SHE COMES IN TODAY WITH C/O 4 DAYS OF UPPER ABDOMINAL PAIN,NAUSEA WITHOUT VOMITING. SHE FEELS BLOATED. BMS FAIRLY NORMAL FOR HER, NO MELENA OR HEME.NO FEVER OR CHILLS. SHE IS ON NEXIUM 40 MG DAILY BUT HAS BEEN HAVING SOME BURNING IN HER CHEST THIS PAST WEEK AS WELL, WORSE AT NIGHT. NO DYSPAHGIA. SHE TAKES MELOXICAM CHRONICALLY,ONE DAILY.SHE DESCRIBES HER PAIN AS WORSE THE PAST 24 HOURS,CONSTANT,8/10.   GI Review of Systems    Reports abdominal pain, acid reflux, bloating, heartburn, loss of appetite, and  nausea.     Location of  Abdominal pain: upper abdomen.    Denies belching, chest pain, dysphagia with liquids, dysphagia with solids, vomiting, vomiting blood, and  weight loss.      Reports irritable bowel syndrome.     Denies anal fissure, black tarry stools, change in bowel habit, constipation, diarrhea, diverticulosis, fecal incontinence, heme positive stool, hemorrhoids, jaundice, light color stool, liver problems, rectal bleeding, and  rectal pain. Preventive Screening-Counseling & Management  Alcohol-Tobacco     Smoking Status: never  Caffeine-Diet-Exercise     Does Patient Exercise: no      Drug Use:  no.      Current Medications (verified): 1)  Lipitor 80 Mg Tabs (Atorvastatin Calcium) .... Take 1 Tablet By Mouth  Once A Day 2)  Hydrochlorothiazide 25 Mg Tabs (Hydrochlorothiazide) .... Take 1 Tablet By Mouth Once A Day 3)  Atenolol 100 Mg Tabs (Atenolol) .... Take 1 Tablet By Mouth Once A Day 4)  Nexium 40 Mg Cpdr (Esomeprazole Magnesium) .... Take 1 Tablet By Mouth Once A Day 5)  Meloxicam 15 Mg Tabs (Meloxicam) .... Take 1 Tablet By Mouth Once A Day 6)  Klonopin 1 Mg Tabs (Clonazepam) .... Take 1/2 Tablet By Mouth Three Times A Day 7)  Cyanocobalamin 1000 Mcg/ml Soln (Cyanocobalamin) .... Inject 1 Ml Im Every 2 Weeks 8)  Vitamin D (125 Mg) .... Take 1 Tablet Every Monday, Tuesday and Wednesday 9)  Magnesium/calcium/zinc Tablets (400 Mg/1000mg /15 Mg) .... Take 1 Tablet By Mouth Once A Day 10)  Maalox Regular Strength 225-200-25 Mg/51ml Susp (Alum & Mag Hydroxide-Simeth) .... Take As Needed  Allergies (verified): No Known Drug Allergies  Past History:  Past Medical History: GERD IBS OSTEOARTHRITIS HTN HYPERLIPIDEMIA  Past Surgical History: C-Section x 4 Appendectomy  Family History: Family History of Colon Cancer: Maternal Aunt Family History of Breast Cancer: Aunt Family History of Esophageal Cancer: Brother (in his 29's) Family History of Stomach Cancer:Brother Family History of Diabetes: Mother, Brother, Maternal Uncle Family History of Heart Disease: P, M Grandfather, P, M, Grandmother  Social History: Patient has never smoked.  Alcohol Use - yes-rare Daily Caffeine Use-minimal Illicit Drug Use - no Patient does not get regular exercise.  Smoking Status:  never Drug Use:  no Does Patient Exercise:  no  Review of Systems  The patient complains of allergy/sinus, anxiety-new, back pain, breast changes/lumps, change in vision, fatigue, headaches-new, muscle pains/cramps, sleeping problems, sore throat, urination - excessive, and voice change.  The patient denies anemia, arthritis/joint pain, blood in urine, confusion, cough, coughing up blood, depression-new, fainting, fever,  hearing problems, heart murmur, heart rhythm changes, itching, menstrual pain, night sweats, nosebleeds, pregnancy symptoms, shortness of breath, skin rash, swelling of feet/legs, swollen lymph glands, thirst - excessive , urination - excessive , urination changes/pain, urine leakage, and vision changes.         ROS OTHERWISE AS IN  HPI  Vital Signs:  Patient profile:   55 year old female Height:      64 inches Weight:      178.50 pounds BMI:     30.75 BSA:     1.87 Pulse rate:   60 / minute Pulse rhythm:   regular BP sitting:   122 / 78  (left arm)  Vitals Entered By: Hortense Ramal CMA Duncan Dull) (October 11, 2009 2:00 PM)  Physical Exam  General:  Well developed, well nourished, no acute distress. Head:  Normocephalic and atraumatic. Eyes:  PERRLA, no icterus. Lungs:  Clear throughout to auscultation. Heart:  Regular rate and rhythm; no murmurs, rubs,  or bruits. Abdomen:  SOFT, TENDER EPIGASTRIUM AND RUQ, NO GUARDING, NO REBOUND, NO MASS OR PALP HSM,BS+ Rectal:  NOT DONE Extremities:  No clubbing, cyanosis, edema or deformities noted. Neurologic:  Alert and  oriented x4;  grossly normal neurologically. Psych:  Alert and cooperative. Normal mood and affect.   Impression & Recommendations:  Problem # 1:  ABDOMINAL PAIN-EPIGASTRIC (ICD-789.06) Assessment New 55 YO FEMALE WITH 4 DAY HX OF UPPER ABDOMINAL PAIN,AND NAUSEA,INCREASED HEARTBURN. ETIOLOGY NOT CLEAR-R/O NSAID INDUCED GASTROPATHY/PUD,R/O GB DISEASE  LABS AS BELOW SCHEDULE FOR UPPER ABDOMINAL US INCREASE NEXIUM TO 40 MG TWICE DAILY PHENERGAN 12.5-25 MG  Q 6 HOURS AS NEEDED ULTRAM 50 MG Q 6 HOURS as needed PAIN MAY NEED EGD FOLLOW UP WITH DR. PATTERSON IN 2-3 WEEKS.  Problem # 2:  DIVERTICULOSIS-COLON (ICD-562.10) Assessment: Comment Only LAST COLON 8/07  Problem # 3:  HYPERLIPIDEMIA (ICD-272.4) Assessment: Comment Only  Problem # 4:  GERD (ICD-530.81) Assessment: Deteriorated CURRENTLY POORLY CONTROLLED,INCREASE  NEXIUM TO TWICE DAILY  Problem # 5:  HYPERTENSION (ICD-401.9) Assessment: Comment Only  Other Orders: TLB-CMP (Comprehensive Metabolic Pnl) (80053-COMP) TLB-CBC Platelet - w/Differential (85025-CBCD) TLB-Lipase (83690-LIPASE) Ultrasound Abdomen (UAS)  Patient Instructions: 1)  Please go for your abdominal ultrasound scheduled for 10/12/09 at [facility]. 2)  Increase your Nexium to two times a day dosing temporarily. We have provided you with samples. 3)  Please pick up your phenergan 25 mg 1/2-1 tablet by mouth every 6 hours as needed. 4)  Please pick up your ultram 50 mg 1 tablet by mouth every 6 hour as needed. 5)  Call our office should your symptoms worsen or change. 6)  Copy sent to : Lucky Cowboy, MD 7)  The medication list was reviewed and reconciled.  All changed / newly prescribed medications were explained.  A complete medication list was provided to the patient / caregiver. Prescriptions: PROMETHAZINE HCL 25 MG TABS (PROMETHAZINE HCL) Take 1/2-1 tablet by mouth every 6 hours as needed  #20 x 0   Entered by:   Lowry Ram NCMA   Authorized by:   Sammuel Cooper PA-c   Signed by:   Lowry Ram NCMA on 10/11/2009   Method used:   Electronically to  [facility]  [phone]* (retail)       [address] [place], [place]  [address]       Ph: [phone] or [phone]       Fax: [phone]   RxID:   [id number] ULTRAM 50 MG TABS (TRAMADOL HCL) Take 1 tablet by mouth every 6 hours as needed  #20 x 0   Entered by:   Lowry Ram NCMA   Authorized by:   Sammuel Cooper PA-c   Signed by:   Lowry Ram NCMA on 10/11/2009   Method used:   Electronically to        [facility]  [phone]* (retail)       [address] [place], [place]  [address]       Ph: [phone] or [phone]       Fax: [phone]   RxID:   [id number]

## 2010-08-25 NOTE — Procedures (Signed)
Summary: [facility] EGD   EGD  Procedure date:  02/28/2006  Findings:      Location: [facility]   Patient Name: Helen Cabrera, Helen Cabrera. MRN:  Procedure Procedures: Panendoscopy (EGD) CPT: 43235.    with biopsy(s)/brushing(s). CPT: D1846139.  Personnel: Endoscopist: Vania Rea. [name], MD.  Exam Location: Exam performed in Outpatient Clinic. Outpatient  Patient Consent: Procedure, Alternatives, Risks and Benefits discussed, consent obtained, from patient. Consent was obtained by the RN.  Indications Symptoms: Abdominal pain, location: epigastric.  History  Current Medications: Patient is not currently taking Coumadin.  Pre-Exam Physical: Performed Feb 28, 2006  Cardio-pulmonary exam, Abdominal exam, Extremity exam, Mental status exam WNL.  Comments: Pt. history reviewed/updated, physical exam performed prior to initiation of sedation? Exam Exam Info: Maximum depth of insertion Duodenum, intended Duodenum. Patient position: on left side. Duration of exam: 10 minutes. Vocal cords visualized. Gastric retroflexion performed. Images taken. ASA Classification: I. Tolerance: excellent.  Sedation Meds: Patient assessed and found to be appropriate for moderate (conscious) sedation. Fentanyl 75 mcg. given IV. Versed 7 mg. given IV. Cetacaine Spray 2 sprays given aerosolized.  Monitoring: BP and pulse monitoring done. Oximetry used. Supplemental O2 given at 2 Liters.  Instrument(s): GIF 160. Serial [id number].   Findings - Normal: Proximal Esophagus to Distal Esophagus. Not Seen: Tumor. Barrett's esophagus. Mucosal abnormality. Stricture.  - MUCOSAL ABNORMALITY: Cardia to Antrum. Granular mucosa. Biopsy/Mucosal Abn taken. RUT done, results pending. ICD9: Gastritis, Unspecified: 535.50. Comment: Atrophic mucosa and no rugal pattern noted c/w atrophic gastritis...  - Normal: Antrum to Duodenal 2nd Portion. Not Seen: Ulcer. Mucosal abnormality. AVM's. Foreign body.  Stricture. Biopsy/Normal taken. Comments: SI Bx. done.Marland KitchenMarland KitchenR/O celiac disease...   Assessment  Diagnoses: 535.50: Gastritis, Unspecified. R/O H.pylori.   Comments: Probable functional dyspepsia here... Events  Unplanned Intervention: No unplanned interventions were required.  Plans Medication(s): Await pathology. Other: Librax 1 TID, starting Feb 28, 2006 for 4 wks.   Disposition: After procedure patient sent to recovery. After recovery patient sent home.  Scheduling: Colonoscopy, to Vania Rea. [name], MD, around Mar 14, 2006.   Comments: Needs B12 level....  cc: Janae Sauce  This report was created from the original endoscopy report, which was reviewed and signed by the above listed endoscopist.

## 2010-08-25 NOTE — Miscellaneous (Signed)
Summary: Clotest  Clinical Lists Changes  Orders: Added new Test order of TLB-H Pylori Screen Gastric Biopsy (83013-CLOTEST) - Signed 

## 2010-08-25 NOTE — Progress Notes (Signed)
Summary: Lab results  Phone Note Call from Patient Call back at [phone] Cell   Caller: Patient Call For: Mike Gip Reason for Call: Lab or Test Results Action Taken: Patient advised to call 911 Summary of Call: Calling about lab results Initial call taken by: Karna Christmas,  October 12, 2009 3:06 PM  Follow-up for Phone Call        I let the pt know her lab results were normal.  She is feeling some better, still only drinking water and Gingerale.  I told her to start soups, chicken broth, baked potato, toast, scambled eggs.  I told her she needs to slowly introduce foods for nourishment.  She thinks the promethazine made her legs hurt.  I told her to take it only if nauseous.  She told me [facility] called her this AM and they had overbooked pts so they put her on   for tomorrow 3-23 at 11Am. Follow-up by: Joselyn Glassman,  October 12, 2009 3:26 PM

## 2010-08-25 NOTE — Procedures (Signed)
Summary: [facility] COLON   Colonoscopy  Procedure date:  03/14/2006  Findings:      Location:  [facility].   Patient Name: Helen Cabrera, Helen Cabrera MRN:  Procedure Procedures: Colonoscopy CPT: [phone].  Personnel: Endoscopist: Vania Rea. [name], MD.  Exam Location: Exam performed in Outpatient Clinic. Outpatient  Patient Consent: Procedure, Alternatives, Risks and Benefits discussed, consent obtained, from patient. Consent was obtained by the RN.  Indications Symptoms: Abdominal pain / bloating.  History  Current Medications: Patient is not currently taking Coumadin.  Pre-Exam Physical: Performed Mar 14, 2006. Cardio-pulmonary exam, Rectal exam, Abdominal exam, Extremity exam, Mental status exam WNL.  Comments: Pt. history reviewed/updated, physical exam performed prior to initiation of sedation? Exam Exam: Extent of exam reached: Cecum, extent intended: Cecum.  The cecum was identified by appendiceal orifice and IC valve. Patient position: on left side. Colon retroflexion performed. Images taken. ASA Classification: I. Tolerance: excellent.  Monitoring: Pulse and BP monitoring, Oximetry used. Supplemental O2 given. at 2 Liters.  Colon Prep Used Golytely for colon prep. Prep results: excellent.  Sedation Meds: Patient assessed and found to be appropriate for moderate (conscious) sedation. Fentanyl 50 mcg. given IV. Versed 5 mg. given IV.  Instrument(s): CF 140L. Serial [id number].  Findings - DIVERTICULOSIS: Descending Colon to Sigmoid Colon. Not bleeding. ICD9: Diverticulosis, Colon: 562.10.  - NORMAL EXAM: Cecum to Rectum. Not Seen: Polyps. AVM's. Colitis. Tumors. Melanosis. Crohn's. Diverticulosis. Hemorrhoids.   Assessment Normal examination.  Diagnoses: 562.10: Diverticulosis, Colon.   Comments: Probable IBS and spleenic flexure syndrome.... Events  Unplanned Interventions: No intervention was required.  Plans Medication Plan: Continue  current medications. Fiber supplements: Methylcellulose 1 tsp QAM, starting Mar 14, 2006 for indefinitely.  [empty]: Librax 1 TID, starting Mar 14, 2006 for 4 wks.   Patient Education: Patient given standard instructions for: Diverticulosis. high fiber diet.  Disposition: After procedure patient sent to recovery. After recovery patient sent home.  Scheduling/Referral: Clinic Visit, to Vania Rea. [name], MD, around Apr 14, 2006.    cc:  Weber Cooks. MCKeown, MD  This report was created from the original endoscopy report, which was reviewed and signed by the above listed endoscopist.

## 2010-08-25 NOTE — Procedures (Signed)
Summary: Upper Endoscopy  Patient: [name] Note: All result statuses are Final unless otherwise noted.  Tests: (1) Upper Endoscopy (EGD)   EGD Upper Endoscopy       DONE      [facility]     [address]     [place], [place]  [address]           ENDOSCOPY PROCEDURE REPORT           PATIENT:  Helen, Cabrera  MR#:  [id number]     BIRTHDATE:  09-Jan-1956, 53 yrs. old  GENDER:  female           ENDOSCOPIST:  Vania Rea. [name], MD, [facility]     Referred by:           PROCEDURE DATE:  12/27/2009     PROCEDURE:  EGD with biopsy     ASA CLASS:  Class II     INDICATIONS:  abdominal pain, nausea           MEDICATIONS:   There was residual sedation effect present from     prior procedure., Fentanyl 25 mcg IV, Versed 2 mg IV     TOPICAL ANESTHETIC:  Exactacain Spray           DESCRIPTION OF PROCEDURE:   After the risks benefits and     alternatives of the procedure were thoroughly explained, informed     consent was obtained.  The [facility] GIF-H180 [id number] endoscope was     introduced through the mouth and advanced to the second portion of     the duodenum, without limitations.  The instrument was slowly     withdrawn as the mucosa was fully examined.     <<PROCEDUREIMAGES>>           The upper, middle, and distal third of the esophagus were     carefully inspected and no abnormalities were noted. The z-line     was well seen at the GEJ. The endoscope was pushed into the fundus     which was normal including a retroflexed view. The antrum,gastric     body, first and second part of the duodenum were unremarkable.     small bowel bx. and clo bx. done.    Retroflexed views revealed no     abnormalities.    The scope was then withdrawn from the patient     and the procedure completed.           COMPLICATIONS:  None           ENDOSCOPIC IMPRESSION:     1) Normal EGD     ROBABLE IBS.     RECOMMENDATIONS:     1) Await biopsy results     LIBRAX TID,,AC           REPEAT EXAM:   No           ______________________________     Vania Rea. [name], MD, [facility]           CC:  Lucky Cowboy, MD           n.     Rosalie Doctor:   Vania Rea. Jersi Mcmaster at 12/27/2009 04:02 PM           Darlys Gales, [id number]  Note: An exclamation mark (!) indicates a result that was not dispersed into the flowsheet. Document Creation Date: 12/27/2009 4:04 PM _______________________________________________________________________  (1) Order result status: Final Collection or observation date-time: 12/27/2009 15:56 Requested date-time:  Receipt date-time:  Reported date-time:  Referring Physician:   Ordering Physician: Sheryn Bison [phone]) Specimen Source:  Source: Launa Grill Order Number: [id number] Lab site:

## 2010-08-25 NOTE — Letter (Signed)
Summary: Patient Notice- Colon Biospy Results  [facility]  [address] [place], [place] [address]   Phone: [phone]  Fax: [phone]        December 29, 2009 MRN: [id number]    [facility] [address] [place], [place]  [address]    Dear Ms. Andel,  I am pleased to inform you that the biopsies taken during your recent colonoscopy did not show any evidence of cancer upon pathologic examination.  Additional information/recommendations:  __No further action is needed at this time.  Please follow-up with      your primary care physician for your other healthcare needs.  __Please call [phone] to schedule a return visit to review      your condition.  _x_Continue with the treatment plan as outlined on the day of your      exam.  __You should have a repeat colonoscopy examination for this problem           in _ years.  Please call us if you are having persistent problems or have questions about your condition that have not been fully answered at this time.  Sincerely,  Mardella Layman MD [facility]   This letter has been electronically signed by your physician.  Appended Document: Patient Notice- Colon Biospy Results letter mailed.

## 2010-08-25 NOTE — Miscellaneous (Signed)
Summary: clotest  Clinical Lists Changes  Orders: Added new Test order of TLB-H Pylori Screen Gastric Biopsy (83013-CLOTEST) - Signed 

## 2010-08-25 NOTE — Miscellaneous (Signed)
Summary: gi med  Clinical Lists Changes  Medications: Added new medication of LIBRAX 2.5-5 MG  CAPS (CLIDINIUM-CHLORDIAZEPOXIDE) three times a day ac - Signed Rx of LIBRAX 2.5-5 MG  CAPS (CLIDINIUM-CHLORDIAZEPOXIDE) three times a day ac;  #100 x 6;  Signed;  Entered by: Eual Fines RN;  Authorized by: Mardella Layman MD [facility];  Method used: [facility]  [phone]*, [address] [place], [place]  [address], Ph: [phone] or [phone], Fax: [phone] Observations: Added new observation of ALLERGY REV: Done (12/27/2009 16:17) Added new observation of NKA: T (12/27/2009 16:17)    Prescriptions: LIBRAX 2.5-5 MG  CAPS (CLIDINIUM-CHLORDIAZEPOXIDE) three times a day ac  #100 x 6   Entered by:   Eual Fines RN   Authorized by:   Mardella Layman MD [facility]   Signed by:   Eual Fines RN on 12/27/2009   Method used:   [facility] to        [facility]  [phone]* (retail)       [address] [place], [place]  [address]       Ph: [phone] or [phone]       Fax: [phone]   RxID:   [id number]

## 2010-09-05 ENCOUNTER — Other Ambulatory Visit: Payer: Self-pay | Admitting: Internal Medicine

## 2010-09-05 DIAGNOSIS — M62838 Other muscle spasm: Secondary | ICD-10-CM

## 2010-09-05 DIAGNOSIS — M542 Cervicalgia: Secondary | ICD-10-CM

## 2010-09-06 ENCOUNTER — Ambulatory Visit
Admission: RE | Admit: 2010-09-06 | Discharge: 2010-09-06 | Disposition: A | Discharge status: Home / Self Care | Payer: Private Health Insurance - Indemnity | Source: Ambulatory Visit | Attending: Internal Medicine | Admitting: Internal Medicine

## 2010-09-06 DIAGNOSIS — M62838 Other muscle spasm: Secondary | ICD-10-CM

## 2010-09-06 DIAGNOSIS — M542 Cervicalgia: Secondary | ICD-10-CM

## 2010-09-06 MED ORDER — GADOBENATE DIMEGLUMINE 529 MG/ML IV SOLN
16.0000 mL | Freq: Once | INTRAVENOUS | Status: AC | PRN
  Administered 2010-09-06: 16 mL via INTRAVENOUS

## 2010-10-27 LAB — POCT RAPID STREP A (OFFICE): Streptococcus, Group A Screen (Direct): NEGATIVE

## 2010-10-27 LAB — STREP A DNA PROBE: Group A Strep Probe: NEGATIVE

## 2010-11-08 LAB — POCT I-STAT, CHEM 8
Creatinine, Ser: 1.3 mg/dL — ABNORMAL HIGH (ref 0.4–1.2)
Glucose, Bld: 122 mg/dL — ABNORMAL HIGH (ref 70–99)
Hemoglobin: 14.6 g/dL (ref 12.0–15.0)
TCO2: 31 mmol/L (ref 0–100)

## 2010-11-08 LAB — CBC
HCT: 40.5 % (ref 36.0–46.0)
MCV: 87.5 fL (ref 78.0–100.0)
RBC: 4.62 MIL/uL (ref 3.87–5.11)
WBC: 16.4 10*3/uL — ABNORMAL HIGH (ref 4.0–10.5)

## 2010-11-08 LAB — DIFFERENTIAL
Eosinophils Absolute: 0 10*3/uL (ref 0.0–0.7)
Eosinophils Relative: 0 % (ref 0–5)
Lymphs Abs: 1 10*3/uL (ref 0.7–4.0)
Monocytes Relative: 5 % (ref 3–12)

## 2010-12-09 NOTE — Assessment & Plan Note (Signed)
[facility]                           GASTROENTEROLOGY OFFICE NOTE   Helen Cabrera                      MRN:          [id number]  DATE:04/13/2006                            DOB:          [birth year]    Helen Cabrera returns about the same symptomatically, but she has less nausea and  less abdominal cramping.  Her bowels are more firm.  All of these are  improvements from her previous symptomatology.  She does continue to  complain of abdominal gas and bloating, which I think is from increased  fiber in her diet.   Her colonoscopy on March 14, 2006 was unremarkable except for  diverticulosis.  Endoscopic exam also was performed and was unremarkable,  including biopsies for Helicobacter pylori and a small bowel biopsy for  celiac disease.  She had a nonspecific gastritis and B12 level was  borderline low at 227 pg% and she has been started on B12 parenteral  replacement therapy and weekly nasal B12 gel.  Her lab data otherwise was  all normal, except for a slightly low albumin of 3.4 g% and a sed rate of  30.   I have placed her on Librax 3 times a day, which she is taking without  difficulty.  Her appetite is good and she is actually gaining weight.   Her weight today is 173 pounds, which is up 4 pounds from September 5.  Blood pressure 102/58 and pulse was 66 and regular.  GENERAL:  Physical exam was not repeated.   ASSESSMENT:  1. B12 deficiency either secondary to pernicious anemia, occult chronic      pancreatitis, bacterial overgrowth syndrome, or ileal disease versus      poor absorption of food - bound to B12.  2. Probable irritable bowel syndrome, rule out inflammatory bowel disease.  3. History of previous appendectomy and hysterectomy.  4. Consider occult bacterial overgrowth syndrome.   RECOMMENDATIONS:  1. Check anti-parietal and anti-intrinsic factor antibodies.  2. Outpatient small bowel series.  3. Check inflammatory bowel  disease first-step serologic markers.  4. Continue Librax and low gas diet.  I have advised her to avoid      sorbitol and fructose.  5. GI followup in 1 month's time and consider Xifaxan nonabsorbable      antibiotic therapy depending on clinical course.                                  Vania Rea. [name], MD, Helen Cabrera, Helen Cabrera   DRP/MedQ  DD:  04/13/2006  DT:  04/15/2006  Job #:  [id number]   cc:   Lucky Cowboy, M.D.

## 2010-12-09 NOTE — Assessment & Plan Note (Signed)
[facility]                           GASTROENTEROLOGY OFFICE NOTE   Helen Cabrera, Helen Cabrera                      MRN:          [id number]  DATE:02/27/2006                            DOB:          1955-10-11    REASON FOR VISIT:  Helen Cabrera is a 55 year old patient referred through the  courtesy of Dr. Oneta Rack for evaluation of epigastric abdominal pain for the  last several months which is described as a constant rolling sensation which  occurs on and off during the day and occasionally will awaken her from  sleep.  She has noticed that she feels better if she eats small meals.  It  is worse if she overeats.  She has had mild nausea but no emesis.  She does  describe reflux symptoms of belching, burping, and burning substernal chest  pain on and off for the last two years.  Because of this discomfort, she  does have some pain in her back and has lost six pounds of weight.  She also  has IBS and takes p.r.n. Levsin for lower abdominal cramps but denies  diarrhea, constipation, melena, or hematochezia.  She has been on antiacids  without improvement and has been seen by Dr. Oneta Rack and had a negative  upper abdominal ultrasound exam.  She has been no Nexium 40 mg twice a day,  p.r.n. Tums, and p.r.n. hyoscyamine with no response.  She denies dysphagia,  clay-colored stools, dark urine, icterus, fever, or chills.  She denies any  infectious disease exposure, trauma, or new medications.   The patient has never had endoscopy exams, colonoscopy, or barium studies.   PAST MEDICAL HISTORY:  1.  Essential hypertension.  2.  Hypercholesterolemia.  3.  Chronic depression.  4.  Allergic sinusitis.  5.  Previous hysterectomy and appendectomy.   MEDICATIONS:  1.  Hyoscyamine 0.375 mg twice a day.  2.  Pravastatin 40 mg daily.  3.  Nexium 40 mg twice a day.  4.  Celebrex 200 mg daily for back pain.  5.  Atenolol 100 mg daily.  6.  HCTZ 25 mg daily.  7.   Klonopin 0.5 mg twice a day.  8.  Motrin p.r.n.  9.  Potassium p.r.n.  10. She, in the past, has tried multiple SSRIs for depression but could not      tolerate these medications.   FAMILY HISTORY:  Remarkable for colon cancer in maternal aunt and a brother  who apparently had gastric carcinoma.  Her father died of alcoholic  cirrhosis.  She also has an aunt that has had breast carcinoma.   SOCIAL HISTORY:  The patient is married and lives with her husband.  She has  four children.  She has a 12th grade education and works as a work  Physiological scientist.  She does not smoke and uses ethanol socially.  She  has been under a lot of stress because her mother has severe Alzheimer's  disease, and the patient has to take care of her mother and multiple  grandchildren.   REVIEW OF SYSTEMS:  Positive for multiple complaints, including peripheral  edema, nonproductive cough, insomnia, headaches, back pain, visual and  hearing problems, urinary incontinence, diffuse myalgias, recurrent breast  lumps with planned mammogram on March 03, 2006.   ALLERGIES:  REVIEW OF HER RECORDS FROM DR. Oneta Rack SHOWS THAT SHE HAS  MULTIPLE INTOLERANCES TO PAXIL, EFFEXOR, PROZAC, ZOLOFT, AMITRIPTYLINE, AND  CARAFATE.   PHYSICAL EXAMINATION:  GENERAL:  She is an attractive-appearing white female  in no acute distress.  She appears her stated age.  She is 5 feet 4 inches  tall and weighs 170 pounds.  VITAL SIGNS:  Blood pressure 110/70, pulse 66 and regular.  SKIN:  I could not appreciate stigmata of chronic liver disease.  NECK:  Her thyroid was palpable but nonnodular and nontender.  CHEST:  Clear to percussion and auscultation.  HEART:  She appeared to be in a regular rhythm, and I could not appreciate  murmurs, rubs, or gallops.  ABDOMEN:  There was no hepatosplenomegaly, abdominal masses, or tenderness.  Bowel sounds were normal.  EXTREMITIES:  Unremarkable.  NEUROLOGIC:  Mental status was clear.   RECTAL:  Inspection of the rectum was unremarkable as well as rectal exam.  Stool was guaiac-negative.   ASSESSMENT:  1.  Very unusual abdominal epigastric pain unresponsive to antiacids and      proton pump inhibitor therapy.  As mentioned above, she does have a      history of gastric carcinoma in her brother, and she may have chronic      Helicobacter pylori infection and atypical peptic ulcer disease.  2.  History of irritable bowel syndrome with as needed Levsin use.  She does      need screening colonoscopy because of her age.  3.  Rule out cholelithiasis.  4.  Family history of gastric carcinoma and colon polyps.  5.  Status post hysterectomy and appendectomy.  6.  History of essential hypertension and hypercholesterolemia.  7.  History of chronic depression and multiple drug intolerances.   RECOMMENDATIONS:  1.  Screening laboratory parameters, including amylase, lipase, sed rate,      and thyroid function test.  2.  Upper abdominal ultrasound exam and endoscopy.  3.  Continue above-mentioned medications until workup complete.  4.  The patient will need screening colonoscopy once upper GI issues are      addressed.  5.  Continue other medications as per Dr. Oneta Rack.                                   Helen Rea. [name], MD, [facility], [place]   DRP/MedQ  DD:  02/27/2006  DT:  02/27/2006  Job #:  [id number]   cc:   Lucky Cowboy, MD

## 2010-12-09 NOTE — Assessment & Plan Note (Signed)
 [facility]                           GASTROENTEROLOGY OFFICE NOTE   CHENNEL, OLIVOS                      MRN:          [id number]  DATE:05/29/2006                            DOB:          [birth year]    REASON FOR VISIT:  Rick's work up to date has been fairly unremarkable.  She had a small bowel series that showed rapid intestinal transit but  otherwise was normal.  She has responded to Librax three times a day and  Probiotic therapy.  Inflammatory bowel disease serologies were negative as  were Sprue antibodies. Anti-parietal cell antibodies were negative.   PLAN:  Will continue her on Align Probiotic therapy daily along with Librax  as needed.  I will see her on a p.r.n. basis in the future as needed.     Vania Rea. [name], MD, Caleen Essex, FAGA  Electronically Signed    DRP/MedQ  DD: 05/29/2006  DT: 05/30/2006  Job #: [id number]

## 2011-03-18 ENCOUNTER — Emergency Department (HOSPITAL_COMMUNITY)
Admission: EM | Admit: 2011-03-18 | Discharge: 2011-03-19 | Disposition: A | Discharge status: Home / Self Care | Payer: 59 | Attending: Emergency Medicine | Admitting: Emergency Medicine

## 2011-03-18 DIAGNOSIS — R51 Headache: Secondary | ICD-10-CM | POA: Insufficient documentation

## 2011-03-18 DIAGNOSIS — I10 Essential (primary) hypertension: Secondary | ICD-10-CM | POA: Insufficient documentation

## 2011-03-18 DIAGNOSIS — R110 Nausea: Secondary | ICD-10-CM | POA: Insufficient documentation

## 2011-03-18 DIAGNOSIS — E7800 Pure hypercholesterolemia, unspecified: Secondary | ICD-10-CM | POA: Insufficient documentation

## 2011-03-18 LAB — CBC
Hemoglobin: 12.7 g/dL (ref 12.0–15.0)
MCH: 28.2 pg (ref 26.0–34.0)
MCHC: 32.8 g/dL (ref 30.0–36.0)
Platelets: 295 10*3/uL (ref 150–400)

## 2011-03-19 LAB — COMPREHENSIVE METABOLIC PANEL
ALT: 21 U/L (ref 0–35)
Calcium: 9.2 mg/dL (ref 8.4–10.5)
Creatinine, Ser: 0.89 mg/dL (ref 0.50–1.10)
GFR calc Af Amer: >60 mL/min (ref >60)
Glucose, Bld: 101 mg/dL — ABNORMAL HIGH (ref 70–99)
Sodium: 138 mEq/L (ref 135–145)
Total Protein: 7.3 g/dL (ref 6.0–8.3)

## 2011-03-19 LAB — URINALYSIS, ROUTINE W REFLEX MICROSCOPIC
Bilirubin Urine: NEGATIVE
Ketones, ur: NEGATIVE mg/dL
Nitrite: NEGATIVE
Protein, ur: NEGATIVE mg/dL
Specific Gravity, Urine: 1.021 (ref 1.005–1.030)
Urobilinogen, UA: 1 mg/dL (ref 0.0–1.0)

## 2011-03-19 LAB — CK TOTAL AND CKMB (NOT AT ARMC)
CK, MB: 2 ng/mL (ref 0.3–4.0)
Relative Index: 1.8 (ref 0.0–2.5)
Total CK: 112 U/L (ref 7–177)

## 2011-03-19 LAB — TROPONIN I: Troponin I: <0.3 ng/mL (ref <0.30)

## 2011-03-19 LAB — URINE MICROSCOPIC-ADD ON

## 2011-06-13 ENCOUNTER — Other Ambulatory Visit (HOSPITAL_COMMUNITY): Payer: Self-pay | Admitting: Internal Medicine

## 2011-06-13 ENCOUNTER — Other Ambulatory Visit: Payer: Self-pay | Admitting: Internal Medicine

## 2011-06-13 DIAGNOSIS — R1011 Right upper quadrant pain: Secondary | ICD-10-CM

## 2011-06-14 ENCOUNTER — Ambulatory Visit (HOSPITAL_COMMUNITY)
Admission: RE | Admit: 2011-06-14 | Discharge: 2011-06-14 | Disposition: A | Discharge status: Home / Self Care | Payer: 59 | Source: Ambulatory Visit | Attending: Internal Medicine | Admitting: Internal Medicine

## 2011-06-14 DIAGNOSIS — M549 Dorsalgia, unspecified: Secondary | ICD-10-CM | POA: Insufficient documentation

## 2011-06-14 DIAGNOSIS — R1011 Right upper quadrant pain: Secondary | ICD-10-CM

## 2011-06-14 DIAGNOSIS — R109 Unspecified abdominal pain: Secondary | ICD-10-CM | POA: Insufficient documentation

## 2011-06-19 ENCOUNTER — Other Ambulatory Visit: Payer: 59

## 2011-07-03 ENCOUNTER — Encounter: Payer: Self-pay | Admitting: *Deleted

## 2011-07-04 ENCOUNTER — Encounter: Payer: Self-pay | Admitting: Gastroenterology

## 2011-07-04 ENCOUNTER — Ambulatory Visit (INDEPENDENT_AMBULATORY_CARE_PROVIDER_SITE_OTHER): Payer: 59 | Admitting: Gastroenterology

## 2011-07-04 VITALS — BP 130/80 | HR 78 | Ht 64.0 in | Wt 167.0 lb

## 2011-07-04 DIAGNOSIS — R109 Unspecified abdominal pain: Secondary | ICD-10-CM

## 2011-07-04 DIAGNOSIS — G8929 Other chronic pain: Secondary | ICD-10-CM

## 2011-07-04 MED ORDER — TRAMADOL HCL 50 MG PO TABS: ORAL_TABLET | ORAL | Status: DC

## 2011-07-04 NOTE — Progress Notes (Signed)
This is a 55 year old Caucasian female with chronic lower abdominal pain for several years. This pain is not incapacitating, is not associated with any precipitating or alleviating elements, and she apparently has regular bowel movements without melena or hematochezia. Recent upper abdominal ultrasound was normal as was endoscopy and colonoscopy including multiple biopsies and exam for H. pylori. Workup for celiac disease was negative. She has had previous hysterectomy from a complicated pregnancy. The patient denies current gynecologic or other general medical symptoms. She does take Nexium 40 mg a day for acid reflux, and also when necessary Levsin.  Current Medications, Allergies, Past Medical History, Past Surgical History, Family History and Social History were reviewed in [employer] record.  Pertinent Review of Systems Negative   Physical Exam: Awake and alert no acute distress. There is no abdominal distention, organomegaly, masses or tenderness. Bowel sounds are normal and nonobstructive in nature. Rectal exam is deferred    Assessment and Plan: Chronic abdominal pain from intestinal adhesions probably involving her pelvic-colonic region. I think she has had thorough GI evaluation recently, and do not think CT scan of the abdomen is indicated at this time. I placed her on tramadol 50 mg every 6-8 hours as a therapeutic trial. She is to continue other medications as listed with followup with primary care as planned. Encounter Diagnosis  Name Primary?  . Chronic bilateral lower abdominal pain Yes

## 2011-07-04 NOTE — Patient Instructions (Signed)
Your prescription(s) have been sent to you pharmacy.   

## 2011-08-25 ENCOUNTER — Encounter (HOSPITAL_COMMUNITY): Payer: Self-pay | Admitting: Emergency Medicine

## 2011-08-25 ENCOUNTER — Other Ambulatory Visit: Payer: Self-pay

## 2011-08-25 ENCOUNTER — Emergency Department (HOSPITAL_COMMUNITY)
Admission: EM | Admit: 2011-08-25 | Discharge: 2011-08-25 | Disposition: A | Discharge status: Home / Self Care | Payer: 59 | Attending: Emergency Medicine | Admitting: Emergency Medicine

## 2011-08-25 ENCOUNTER — Emergency Department (HOSPITAL_COMMUNITY): Payer: 59

## 2011-08-25 DIAGNOSIS — R6889 Other general symptoms and signs: Secondary | ICD-10-CM

## 2011-08-25 DIAGNOSIS — R112 Nausea with vomiting, unspecified: Secondary | ICD-10-CM | POA: Insufficient documentation

## 2011-08-25 DIAGNOSIS — H538 Other visual disturbances: Secondary | ICD-10-CM | POA: Insufficient documentation

## 2011-08-25 DIAGNOSIS — I10 Essential (primary) hypertension: Secondary | ICD-10-CM | POA: Insufficient documentation

## 2011-08-25 DIAGNOSIS — G9389 Other specified disorders of brain: Secondary | ICD-10-CM | POA: Insufficient documentation

## 2011-08-25 DIAGNOSIS — K589 Irritable bowel syndrome without diarrhea: Secondary | ICD-10-CM | POA: Insufficient documentation

## 2011-08-25 DIAGNOSIS — R29898 Other symptoms and signs involving the musculoskeletal system: Secondary | ICD-10-CM | POA: Insufficient documentation

## 2011-08-25 DIAGNOSIS — E785 Hyperlipidemia, unspecified: Secondary | ICD-10-CM | POA: Insufficient documentation

## 2011-08-25 DIAGNOSIS — R4182 Altered mental status, unspecified: Secondary | ICD-10-CM | POA: Insufficient documentation

## 2011-08-25 LAB — URINALYSIS, ROUTINE W REFLEX MICROSCOPIC
Bilirubin Urine: NEGATIVE
Hgb urine dipstick: NEGATIVE
Nitrite: NEGATIVE
Protein, ur: NEGATIVE mg/dL
Specific Gravity, Urine: 1.011 (ref 1.005–1.030)
Urobilinogen, UA: 0.2 mg/dL (ref 0.0–1.0)

## 2011-08-25 LAB — CBC
HCT: 39.3 % (ref 36.0–46.0)
Hemoglobin: 12.8 g/dL (ref 12.0–15.0)
MCV: 85.1 fL (ref 78.0–100.0)
RBC: 4.62 MIL/uL (ref 3.87–5.11)
WBC: 7.1 10*3/uL (ref 4.0–10.5)

## 2011-08-25 LAB — POCT I-STAT TROPONIN I

## 2011-08-25 LAB — COMPREHENSIVE METABOLIC PANEL
ALT: 13 U/L (ref 0–35)
AST: 17 U/L (ref 0–37)
CO2: 29 mEq/L (ref 19–32)
Calcium: 9.5 mg/dL (ref 8.4–10.5)
Chloride: 105 mEq/L (ref 96–112)
Creatinine, Ser: 1.03 mg/dL (ref 0.50–1.10)
GFR calc Af Amer: 69 mL/min — ABNORMAL LOW (ref >90)
GFR calc non Af Amer: 60 mL/min — ABNORMAL LOW (ref >90)
Glucose, Bld: 85 mg/dL (ref 70–99)
Total Bilirubin: 0.2 mg/dL — ABNORMAL LOW (ref 0.3–1.2)

## 2011-08-25 LAB — DIFFERENTIAL
Eosinophils Relative: 5 % (ref 0–5)
Lymphocytes Relative: 34 % (ref 12–46)
Lymphs Abs: 2.4 10*3/uL (ref 0.7–4.0)
Monocytes Absolute: 0.5 10*3/uL (ref 0.1–1.0)
Neutro Abs: 3.8 10*3/uL (ref 1.7–7.7)

## 2011-08-25 LAB — APTT: aPTT: 29 seconds (ref 24–37)

## 2011-08-25 MED ORDER — SODIUM CHLORIDE 0.9 % IV SOLN
INTRAVENOUS | Status: DC
  Administered 2011-08-25: 17:00:00 via INTRAVENOUS
  Administered 2011-08-25: 1000 mL via INTRAVENOUS

## 2011-08-25 NOTE — ED Notes (Signed)
Has had increasing memory issues speech slurring for the last couple of months

## 2011-08-25 NOTE — ED Notes (Signed)
Pt here with increased weakness, memory  Loss, hallucinations for the weeks, sts this am was unable to get dressed and do daily adls. Pt ambulatory to stretcher bed, nad noted, abc intact, resp e/u, fmaily with pt.

## 2011-08-25 NOTE — ED Notes (Signed)
Pt ambulated with no difficulty to yellow for visual acuity

## 2011-08-25 NOTE — ED Provider Notes (Cosign Needed)
History     CSN: [id number]  Arrival date & time 08/25/11  1227   First MD Initiated Contact with Patient 08/25/11 1310      Chief Complaint  Patient presents with  . Left arm weakness    (Consider location/radiation/quality/duration/timing/severity/associated sxs/prior treatment) HPI  And husband relates the past few weeks or months patient has been having some difficulty picking up things with her left arm. She states she's been forgetful and sometimes she walks into a room and forgets why she went in. Husband states her speech is slow but not slurred. She has a long history of migraine headaches and has been having daily headaches in the top of her head. Husband relates sometimes she stumbles for the past one or 2 months. Patient is right handed. She's had a normal appetite. She has nausea with rare vomiting, she denies diarrhea or cough. She's had some blurred vision off and on. She relates she works and several months ago was called in for making mistakes however that is improved. He also relates a lot of stress, she has 4 grown children and 9 grandchildren. She relates that 57 year old granddaughter is living with her currently and sometimes there are problems. They deny that she is depressed although she gets depressed off and on. Patient was seen today by Dr. Clayton Lefort PA and sent to the ED to be evaluated for possible cerebral mass or psychosis.  PCP Dr Arrie Senate  Past Medical History  Diagnosis Date  . Diverticulosis of colon (without mention of hemorrhage)   . IBS (irritable bowel syndrome)   . Unspecified gastritis and gastroduodenitis without mention of hemorrhage   . Family history of malignant neoplasm of gastrointestinal tract   . Hypertension   . Hypercholesterolemia   . Depression   . Chronic headaches   . Vitamin B12 deficiency   . Anemia   . Bacterial overgrowth syndrome   . Pancreatitis    anxiety  Past Surgical History  Procedure Date  . Abdominal  hysterectomy   . Appendectomy     Family History  Problem Relation Age of Onset  . Diabetes Mother   . Alcohol abuse Father   . Breast cancer Maternal Aunt   . Colon cancer Maternal Aunt   . Cirrhosis Father   . Stomach cancer Brother     History  Substance Use Topics  . Smoking status: Never Smoker   . Smokeless tobacco: Not on file  . Alcohol Use: Yes     socially   employed Lives with husband  OB History    Grav Para Term Preterm Abortions TAB SAB Ect Mult Living                  Review of Systems  All other systems reviewed and are negative.    Allergies  Phenergan  Home Medications   Current Outpatient Rx  Name Route Sig Dispense Refill  . ATENOLOL 100 MG PO TABS Oral Take 100 mg by mouth daily.    . AZITHROMYCIN 250 MG PO TABS Oral Take 250-500 mg by mouth daily. For 5 days; Take 2 tabs on the first day, Then one tab daily for 4 days; Start date unknown    . CLONAZEPAM 1 MG PO TABS Oral Take 0.5-1 mg by mouth 3 (three) times daily as needed. For anxiety    . VITAMIN B-12 IJ Intramuscular Inject 1 mL into the muscle every 21 ( twenty-one) days.    Marland Kitchen ESOMEPRAZOLE MAGNESIUM 40 MG PO CPDR  Oral Take 40 mg by mouth daily before breakfast.     . HYDROCHLOROTHIAZIDE 25 MG PO TABS Oral Take 25 mg by mouth daily.      Marland Kitchen HYOSCYAMINE SULFATE ER 0.375 MG PO TB12 Oral Take 0.375 mg by mouth every 12 (twelve) hours as needed. For stomach cramps    . TRAMADOL HCL 50 MG PO TABS Oral Take 50 mg by mouth every 6 (six) hours as needed. For pain      BP 108/85  Pulse 63  Temp(Src) 97.8 F (36.6 C) (Oral)  Resp 18  SpO2 97%  Vital signs normal    Physical Exam  Constitutional: She is oriented to person, place, and time. She appears well-developed and well-nourished.  Non-toxic appearance. She does not appear ill. No distress.  HENT:  Head: Normocephalic and atraumatic.  Right Ear: External ear normal.  Left Ear: External ear normal.  Nose: Nose normal. No mucosal  edema or rhinorrhea.  Mouth/Throat: Oropharynx is clear and moist and mucous membranes are normal. No dental abscesses or uvula swelling.  Eyes: Conjunctivae and EOM are normal. Pupils are equal, round, and reactive to light.  Neck: Normal range of motion and full passive range of motion without pain. Neck supple.  Cardiovascular: Normal rate, regular rhythm and normal heart sounds.  Exam reveals no gallop and no friction rub.   No murmur heard. Pulmonary/Chest: Effort normal and breath sounds normal. No respiratory distress. She has no wheezes. She has no rhonchi. She has no rales. She exhibits no tenderness and no crepitus.  Abdominal: Soft. Normal appearance and bowel sounds are normal. She exhibits no distension. There is no tenderness. There is no rebound and no guarding.  Musculoskeletal: Normal range of motion. She exhibits no edema and no tenderness.       Moves all extremities well.   Neurological: She is alert and oriented to person, place, and time. She has normal strength. No cranial nerve deficit.       Patient has mild left pronator drift which she states is from achiness in her left arm when she holds it up.  Skin: Skin is warm, dry and intact. No rash noted. No erythema. No pallor.  Psychiatric: She has a normal mood and affect. Her speech is normal and behavior is normal. Her mood appears not anxious.    ED Course  Procedures (including critical care time)  Results for orders placed during the hospital encounter of 08/25/11  CBC      Component Value Range   WBC 7.1  4.0 - 10.5 (K/uL)   RBC 4.62  3.87 - 5.11 (MIL/uL)   Hemoglobin 12.8  12.0 - 15.0 (g/dL)   HCT 45.4  09.8 - 11.9 (%)   MCV 85.1  78.0 - 100.0 (fL)   MCH 27.7  26.0 - 34.0 (pg)   MCHC 32.6  30.0 - 36.0 (g/dL)   RDW 14.7  82.9 - 56.2 (%)   Platelets 276  150 - 400 (K/uL)  DIFFERENTIAL      Component Value Range   Neutrophils Relative 54  43 - 77 (%)   Neutro Abs 3.8  1.7 - 7.7 (K/uL)   Lymphocytes  Relative 34  12 - 46 (%)   Lymphs Abs 2.4  0.7 - 4.0 (K/uL)   Monocytes Relative 7  3 - 12 (%)   Monocytes Absolute 0.5  0.1 - 1.0 (K/uL)   Eosinophils Relative 5  0 - 5 (%)   Eosinophils Absolute 0.3  0.0 -  0.7 (K/uL)   Basophils Relative 1  0 - 1 (%)   Basophils Absolute 0.1  0.0 - 0.1 (K/uL)  COMPREHENSIVE METABOLIC PANEL      Component Value Range   Sodium 141  135 - 145 (mEq/L)   Potassium 3.8  3.5 - 5.1 (mEq/L)   Chloride 105  96 - 112 (mEq/L)   CO2 29  19 - 32 (mEq/L)   Glucose, Bld 85  70 - 99 (mg/dL)   BUN 27 (*) 6 - 23 (mg/dL)   Creatinine, Ser 0.96  0.50 - 1.10 (mg/dL)   Calcium 9.5  8.4 - 04.5 (mg/dL)   Total Protein 6.9  6.0 - 8.3 (g/dL)   Albumin 3.6  3.5 - 5.2 (g/dL)   AST 17  0 - 37 (U/L)   ALT 13  0 - 35 (U/L)   Alkaline Phosphatase 88  39 - 117 (U/L)   Total Bilirubin 0.2 (*) 0.3 - 1.2 (mg/dL)   GFR calc non Af Amer 60 (*) >90 (mL/min)   GFR calc Af Amer 69 (*) >90 (mL/min)  URINALYSIS, ROUTINE W REFLEX MICROSCOPIC      Component Value Range   Color, Urine YELLOW  YELLOW    APPearance CLEAR  CLEAR    Specific Gravity, Urine 1.011  1.005 - 1.030    pH 6.0  5.0 - 8.0    Glucose, UA NEGATIVE  NEGATIVE (mg/dL)   Hgb urine dipstick NEGATIVE  NEGATIVE    Bilirubin Urine NEGATIVE  NEGATIVE    Ketones, ur NEGATIVE  NEGATIVE (mg/dL)   Protein, ur NEGATIVE  NEGATIVE (mg/dL)   Urobilinogen, UA 0.2  0.0 - 1.0 (mg/dL)   Nitrite NEGATIVE  NEGATIVE    Leukocytes, UA NEGATIVE  NEGATIVE   APTT      Component Value Range   aPTT 29  24 - 37 (seconds)  PROTIME-INR      Component Value Range   Prothrombin Time 12.6  11.6 - 15.2 (seconds)   INR 0.92  0.00 - 1.49   POCT I-STAT TROPONIN I      Component Value Range   Troponin i, poc 0.00  0.00 - 0.08 (ng/mL)   Comment 3            Laboratory interpretation all normal   Ct Head Wo Contrast  08/25/2011  *RADIOLOGY REPORT*  Clinical Data: Memory difficulty.  CT HEAD WITHOUT CONTRAST  Technique:  Contiguous axial images  were obtained from the base of the skull through the vertex without contrast.  Comparison: None.  Findings: A densely calcified lesion is seen along the anterior aspect of the right tentorium measuring 1.1 cm in diameter most consistent with a meningioma.  No evidence of acute infarction, hemorrhage, midline shift, mass effect or abnormal extra-axial fluid collection is identified.  There is no hydrocephalus.  No pneumocephalus is identified.  The calvarium is intact.  Imaged paranasal sinuses and mastoid air cells are clear.  IMPRESSION:  1.  No acute finding. 2.  A 1.1 cm densely calcified lesion along the right tentorium is consistent with a meningioma.  Original Report Authenticated By: Bernadene Bell. Maricela Curet, M.D.      Date: 08/25/2011  Rate: 61  Rhythm: normal sinus rhythm  QRS Axis: normal  Intervals: normal  ST/T Wave abnormalities: normal  Conduction Disutrbances:none  Narrative Interpretation:   Old EKG Reviewed: none available     1. Forgetfulness    Plan discharge Devoria Albe, MD, Armando Gang    MDM  Ward Givens, MD 08/25/11 [phone]

## 2011-08-25 NOTE — ED Notes (Signed)
Family at bedside. 

## 2011-08-25 NOTE — ED Notes (Signed)
Patient denies pain and is resting comfortably.  

## 2011-08-25 NOTE — ED Notes (Signed)
Pt returned from xray

## 2011-11-30 DIAGNOSIS — R413 Other amnesia: Secondary | ICD-10-CM

## 2012-02-15 ENCOUNTER — Ambulatory Visit (INDEPENDENT_AMBULATORY_CARE_PROVIDER_SITE_OTHER): Payer: 59 | Admitting: Gastroenterology

## 2012-02-15 ENCOUNTER — Other Ambulatory Visit: Payer: 59

## 2012-02-15 ENCOUNTER — Encounter: Payer: Self-pay | Admitting: Gastroenterology

## 2012-02-15 VITALS — BP 120/64 | HR 60 | Ht 64.0 in | Wt 163.4 lb

## 2012-02-15 DIAGNOSIS — Y849 Medical procedure, unspecified as the cause of abnormal reaction of the patient, or of later complication, without mention of misadventure at the time of the procedure: Secondary | ICD-10-CM

## 2012-02-15 DIAGNOSIS — F419 Anxiety disorder, unspecified: Secondary | ICD-10-CM

## 2012-02-15 DIAGNOSIS — D509 Iron deficiency anemia, unspecified: Secondary | ICD-10-CM

## 2012-02-15 DIAGNOSIS — Z8719 Personal history of other diseases of the digestive system: Secondary | ICD-10-CM

## 2012-02-15 DIAGNOSIS — F411 Generalized anxiety disorder: Secondary | ICD-10-CM

## 2012-02-15 DIAGNOSIS — R109 Unspecified abdominal pain: Secondary | ICD-10-CM

## 2012-02-15 DIAGNOSIS — K660 Peritoneal adhesions (postprocedural) (postinfection): Secondary | ICD-10-CM

## 2012-02-15 DIAGNOSIS — R1030 Lower abdominal pain, unspecified: Secondary | ICD-10-CM

## 2012-02-15 MED ORDER — CILIDINIUM-CHLORDIAZEPOXIDE 2.5-5 MG PO CAPS: 1.0000 | ORAL_CAPSULE | Freq: Three times a day (TID) | ORAL | Status: DC

## 2012-02-15 MED ORDER — MOVIPREP 100 G PO SOLR: 1.0000 | Freq: Once | ORAL | Status: DC

## 2012-02-15 NOTE — Progress Notes (Signed)
History of Present Illness:  This is a 56 year old Caucasian female with chronic lower nominal pain, gas, bloating, and alternating diarrhea and constipation. She's had several cesarean sections, and her lower nominal pain is been felt secondary to pelvic-colonic adhesions. She currently describes worsening mid to lower nominal pain with eating without nausea and vomiting, melena or hematochezia, fever, chills, upper GI or hepatobiliary complaints. She does take Nexium 40 mg a day for chronic acid reflux. Blood work it showed a mild iron deficiency anemia despite oral iron therapy. She does not menstruate has had previous hysterectomy. She complains of chronic fatigue, chronic anxiety, and is on regular Klonopin 1 mg twice a day. Family history is noncontributory.  I have reviewed this patient's present history, medical and surgical past history, allergies and medications.     ROS: The remainder of the 10 point ROS is negative  Allergies  Allergen Reactions  . Promethazine Hcl    Outpatient Prescriptions Prior to Visit  Medication Sig Dispense Refill  . atenolol (TENORMIN) 100 MG tablet Take 100 mg by mouth daily.      . clonazePAM (KLONOPIN) 1 MG tablet Take 0.5-1 mg by mouth 2 (two) times daily as needed. For anxiety      . Cyanocobalamin (VITAMIN B-12 IJ) Inject 1 mL into the muscle. Every 2 weeks      . esomeprazole (NEXIUM) 40 MG capsule Take 40 mg by mouth 2 (two) times daily.       Marland Kitchen azithromycin (ZITHROMAX) 250 MG tablet Take 250-500 mg by mouth daily. For 5 days; Take 2 tabs on the first day, Then one tab daily for 4 days; Start date unknown      . hydrochlorothiazide (HYDRODIURIL) 25 MG tablet Take 25 mg by mouth daily.        . hyoscyamine (LEVBID) 0.375 MG 12 hr tablet Take 0.375 mg by mouth every 12 (twelve) hours as needed. For stomach cramps      . traMADol (ULTRAM) 50 MG tablet Take 50 mg by mouth every 6 (six) hours as needed. For pain       Past Medical History  Diagnosis  Date  . Diverticulosis of colon (without mention of hemorrhage)   . IBS (irritable bowel syndrome)   . Unspecified gastritis and gastroduodenitis without mention of hemorrhage   . Family history of malignant neoplasm of gastrointestinal tract   . Hypertension   . Hypercholesterolemia   . Depression   . Chronic headaches   . Vitamin B12 deficiency   . Anemia     Iron def   . Bacterial overgrowth syndrome   . Pancreatitis   . GERD (gastroesophageal reflux disease)   . Osteoarthritis    Past Surgical History  Procedure Date  . Abdominal hysterectomy   . Appendectomy   . Cesarean section     x 4   History   Social History  . Marital Status: Married    Spouse Name: N/A    Number of Children: 4  . Years of Education: N/A   Occupational History  . CUSTOMER SERVICE [employer]   Social History Main Topics  . Smoking status: Never Smoker   . Smokeless tobacco: Never Used  . Alcohol Use: Yes     socially  . Drug Use: No  . Sexually Active: None   Other Topics Concern  . None   Social History Narrative  . None   Family History  Problem Relation Age of Onset  . Diabetes Mother   .  Alcohol abuse Father   . Breast cancer Maternal Aunt   . Colon cancer Maternal Aunt   . Cirrhosis Father   . Stomach cancer Brother   . Esophageal cancer Brother   . Diabetes Brother   . Diabetes Maternal Uncle   . Heart disease      grandmother  . Heart disease      grandfather        Physical Exam: Blood pressure 120/64, pulse 60 and regular, weight 163 pounds the BMI of 28.05. General well developed well nourished patient in no acute distress, appearing their stated age Eyes PERRLA, no icterus, fundoscopic exam per opthamologist Skin no lesions noted Neck supple, no adenopathy, no thyroid enlargement, no tenderness Chest clear to percussion and auscultation Heart no significant murmurs, gallops or rubs noted Abdomen no hepatosplenomegaly masses or tenderness, BS normal.    Rectal inspection normal no fissures, or fistulae noted.  No masses or tenderness on digital exam. Stool guaiac negative. Small rectocele noted. Large transverse pelvic scar in lower abdomen without evidence of incisional herniation. Extremities no acute joint lesions, edema, phlebitis or evidence of cellulitis. Neurologic patient oriented x 3, cranial nerves intact, no focal neurologic deficits noted. Psychological mental status normal and normal affect.  Assessment and plan: Iron deficiency anemia of unexplained etiology. We have scheduled followup endoscopy and colonoscopy at her convenience. I placed her on a high fiber diet with daily Metamucil, and we'll try Librax 3 times a day 15 minutes before meals. Celiac serologies ordered. She is to continue Nexium been in or endoscopic exams. Patient denies reflux symptoms at this time. She also is on monthly B12 shots.   Encounter Diagnoses  Name Primary?  . Lower abdominal pain Yes  . Iron deficiency anemia, unspecified

## 2012-02-15 NOTE — Patient Instructions (Addendum)
Your physician has requested that you go to the basement for the following lab work before leaving today:  You have been scheduled for an endoscopy and colonoscopy with propofol. Please follow the written instructions given to you at your visit today. Please pick up your prep at the pharmacy within the next 1-3 days. If you use inhalers (even only as needed), please bring them with you on the day of your procedure.  We have sent the following medications to your pharmacy for you to pick up at your convenience: Librax.  Start taking over the counter Metamucil daily.  High Fiber Diet A high fiber diet changes your normal diet to include more whole grains, legumes, fruits, and vegetables. Changes in the diet involve replacing refined carbohydrates with unrefined foods. The calorie level of the diet is essentially unchanged. The Dietary Reference Intake (recommended amount) for adult males is 38 g per day. For adult females, it is 25 g per day. Pregnant and lactating women should consume 28 g of fiber per day. Fiber is the intact part of a plant that is not broken down during digestion. Functional fiber is fiber that has been isolated from the plant to provide a beneficial effect in the body. PURPOSE  Increase stool bulk.   Ease and regulate bowel movements.   Lower cholesterol.  INDICATIONS THAT YOU NEED MORE FIBER  Constipation and hemorrhoids.   Uncomplicated diverticulosis (intestine condition) and irritable bowel syndrome.   Weight management.   As a protective measure against hardening of the arteries (atherosclerosis), diabetes, and cancer.  NOTE OF CAUTION If you have a digestive or bowel problem, ask your caregiver for advice before adding high fiber foods to your diet. Some of the following medical problems are such that a high fiber diet should not be used without consulting your caregiver:  Acute diverticulitis (intestine infection).   Partial small bowel obstructions.    Complicated diverticular disease involving bleeding, rupture (perforation), or abscess (boil, furuncle).   Presence of autonomic neuropathy (nerve damage) or gastric paresis (stomach cannot empty itself).  GUIDELINES FOR INCREASING FIBER  Start adding fiber to the diet slowly. A gradual increase of about 5 more grams (2 slices of whole-wheat bread, 2 servings of most fruits or vegetables, or 1 bowl of high fiber cereal) per day is best. Too rapid an increase in fiber may result in constipation, flatulence, and bloating.   Drink enough water and fluids to keep your urine clear or pale yellow. Water, juice, or caffeine-free drinks are recommended. Not drinking enough fluid may cause constipation.   Eat a variety of high fiber foods rather than one type of fiber.   Try to increase your intake of fiber through using high fiber foods rather than fiber pills or supplements that contain small amounts of fiber.   The goal is to change the types of food eaten. Do not supplement your present diet with high fiber foods, but replace foods in your present diet.  INCLUDE A VARIETY OF FIBER SOURCES  Replace refined and processed grains with whole grains, canned fruits with fresh fruits, and incorporate other fiber sources. White rice, white breads, and most bakery goods contain little or no fiber.   Brown whole-grain rice, buckwheat oats, and many fruits and vegetables are all good sources of fiber. These include: broccoli, Brussels sprouts, cabbage, cauliflower, beets, sweet potatoes, white potatoes (skin on), carrots, tomatoes, eggplant, squash, berries, fresh fruits, and dried fruits.   Cereals appear to be the richest source of  fiber. Cereal fiber is found in whole grains and bran. Bran is the fiber-rich outer coat of cereal grain, which is largely removed in refining. In whole-grain cereals, the bran remains. In breakfast cereals, the largest amount of fiber is found in those with "bran" in their  names. The fiber content is sometimes indicated on the label.   You may need to include additional fruits and vegetables each day.   In baking, for 1 cup white flour, you may use the following substitutions:   1 cup whole-wheat flour minus 2 tbs.    cup white flour plus  cup whole-wheat flour.  Document Released: 07/10/2005 Document Revised: 06/29/2011 Document Reviewed: 05/18/2009 [facility] Patient Information 2012 [place], [place].   cc: Lucky Cowboy, MD

## 2012-02-16 LAB — CELIAC PANEL 10
Gliadin IgA: 6.6 U/mL (ref <20)
Gliadin IgG: 5.3 U/mL (ref <20)
IgA: 253 mg/dL (ref 69–380)
Tissue Transglutaminase Ab, IgA: 5.3 U/mL (ref <20)

## 2012-02-28 ENCOUNTER — Ambulatory Visit (AMBULATORY_SURGERY_CENTER): Payer: 59 | Admitting: Gastroenterology

## 2012-02-28 ENCOUNTER — Encounter: Payer: Self-pay | Admitting: Gastroenterology

## 2012-02-28 VITALS — BP 116/74 | HR 72 | Temp 98.3°F | Resp 20 | Ht 64.0 in | Wt 163.0 lb

## 2012-02-28 DIAGNOSIS — R109 Unspecified abdominal pain: Secondary | ICD-10-CM

## 2012-02-28 DIAGNOSIS — K219 Gastro-esophageal reflux disease without esophagitis: Secondary | ICD-10-CM

## 2012-02-28 DIAGNOSIS — R1013 Epigastric pain: Secondary | ICD-10-CM

## 2012-02-28 DIAGNOSIS — Z1211 Encounter for screening for malignant neoplasm of colon: Secondary | ICD-10-CM

## 2012-02-28 DIAGNOSIS — K589 Irritable bowel syndrome without diarrhea: Secondary | ICD-10-CM

## 2012-02-28 DIAGNOSIS — D509 Iron deficiency anemia, unspecified: Secondary | ICD-10-CM

## 2012-02-28 DIAGNOSIS — D1330 Benign neoplasm of unspecified part of small intestine: Secondary | ICD-10-CM

## 2012-02-28 DIAGNOSIS — K5730 Diverticulosis of large intestine without perforation or abscess without bleeding: Secondary | ICD-10-CM

## 2012-02-28 MED ORDER — SODIUM CHLORIDE 0.9 % IV SOLN: 500.0000 mL | INTRAVENOUS | Status: DC

## 2012-02-28 NOTE — Op Note (Signed)
[facility] [address] [place], [place]  [address]  COLONOSCOPY PROCEDURE REPORT  PATIENT:  Helen Cabrera, Helen Cabrera  MR#:  [id number] BIRTHDATE:  04-22-1956, 55 yrs. old  GENDER:  female ENDOSCOPIST:  Vania Rea. [name], MD, [facility] REF. BY:  Lucky Cowboy, M.D. PROCEDURE DATE:  02/28/2012 PROCEDURE:  Diagnostic Colonoscopy ASA CLASS:  Class II INDICATIONS:  Colorectal cancer screening, average risk, FOBT positive stool, Iron Deficiency Anemia MEDICATIONS:   propofol (Diprivan) 200 mg IV  DESCRIPTION OF PROCEDURE:   After the risks and benefits and of the procedure were explained, informed consent was obtained. Digital rectal exam was performed and revealed no abnormalities. The LB [id number] endoscope was introduced through the anus and advanced to the cecum, which was identified by both the appendix and ileocecal valve.  The quality of the prep was excellent, using MoviPrep.  The instrument was then slowly withdrawn as the colon was fully examined. <<PROCEDUREIMAGES>>  FINDINGS:  No polyps or cancers were seen.  There were mild diverticular changes in left colon. diverticulosis was found. Retroflexed views in the rectum revealed no abnormalities.    The scope was then withdrawn from the patient and the procedure completed.  COMPLICATIONS:  None ENDOSCOPIC IMPRESSION: 1) No polyps or cancers 2) Diverticulosis,mild,left sided diverticulosis RECOMMENDATIONS: 1) Continue current medications 2) High fiber diet. 3) Upper endoscopy will be scheduled 4) Continue current colorectal screening recommendations for "routine risk" patients with a repeat colonoscopy in 10 years.  REPEAT EXAM:  No  ______________________________ Vania Rea. [name], MD, [facility]  CC:  n. eSIGNED:   Vania Rea. Daralyn Bert at 02/28/2012 02:44 PM  Darlys Gales, [phone]

## 2012-02-28 NOTE — Op Note (Signed)
[facility] [address] [place], [place]  [address]  ENDOSCOPY PROCEDURE REPORT  PATIENT:  Helen Cabrera, Helen Cabrera  MR#:  [id number] BIRTHDATE:  Jul 16, 1956, 55 yrs. old  GENDER:  female  ENDOSCOPIST:  Vania Rea. [name], MD, [facility] Referred by:  Lucky Cowboy, M.D.  PROCEDURE DATE:  02/28/2012 PROCEDURE:  EGD with biopsy, 43239, EGD with biopsy for H. pylori 19147 ASA CLASS:  Class II INDICATIONS:  abdominal pain, iron deficiency anemia  MEDICATIONS:   There was residual sedation effect present from prior procedure., propofol (Diprivan) 100 mg IV TOPICAL ANESTHETIC:  DESCRIPTION OF PROCEDURE:   After the risks and benefits of the procedure were explained, informed consent was obtained.  The LB GIF-H180 [id number] endoscope was introduced through the mouth and advanced to the second portion of the duodenum.  The instrument was slowly withdrawn as the mucosa was fully examined. <<PROCEDUREIMAGES>>  The upper, middle, and distal third of the esophagus were carefully inspected and no abnormalities were noted. The z-line was well seen at the GEJ. The endoscope was pushed into the fundus which was normal including a retroflexed view. The antrum,gastric body, first and second part of the duodenum were unremarkable. SI BIOPSIES AND CLO BX. DONE.    Retroflexed views revealed no abnormalities.    The scope was then withdrawn from the patient and the procedure completed.  COMPLICATIONS:  None  ENDOSCOPIC IMPRESSION: 1) Normal EGD 1.R/O CELIAC DISEASE 2.PROBABLE IBS RECOMMENDATIONS: 1) Await biopsy results 2) Rx CLO if positive 3) continue current medications  ______________________________ Vania Rea. [name], MD, [facility]  CC:  n. eSIGNED:   Vania Rea. Shaunessy Dobratz at 02/28/2012 02:56 PM  Darlys Gales, [phone]

## 2012-02-28 NOTE — Progress Notes (Signed)
Patient did not experience any of the following events: a burn prior to discharge; a fall within the facility; wrong site/side/patient/procedure/implant event; or a hospital transfer or hospital admission upon discharge from the facility. (G8907) Patient did not have preoperative order for IV antibiotic SSI prophylaxis. (G8918)  

## 2012-02-28 NOTE — Progress Notes (Signed)
PROPOFOL PER S CAMP CRNA, SEE SCANNED INTRA PROCEDURE REPORT. ALL MEDICINES TITRATED PER CRNA. EWM

## 2012-02-28 NOTE — Patient Instructions (Addendum)
Findings:  Diverticulosis in the colon, Normal EGD Recommendations:  High Fiber Diet, Continue current medications, Repeat colonoscopy in 10 Years.   YOU HAD AN ENDOSCOPIC PROCEDURE TODAY AT THE [facility]: Refer to the procedure report that was given to you for any specific questions about what was found during the examination.  If the procedure report does not answer your questions, please call your gastroenterologist to clarify.  If you requested that your care partner not be given the details of your procedure findings, then the procedure report has been included in a sealed envelope for you to review at your convenience later.  YOU SHOULD EXPECT: Some feelings of bloating in the abdomen. Passage of more gas than usual.  Walking can help get rid of the air that was put into your GI tract during the procedure and reduce the bloating. If you had a lower endoscopy (such as a colonoscopy or flexible sigmoidoscopy) you may notice spotting of blood in your stool or on the toilet paper. If you underwent a bowel prep for your procedure, then you may not have a normal bowel movement for a few days.  DIET: Your first meal following the procedure should be a light meal and then it is ok to progress to your normal diet.  A half-sandwich or bowl of soup is an example of a good first meal.  Heavy or fried foods are harder to digest and may make you feel nauseous or bloated.  Likewise meals heavy in dairy and vegetables can cause extra gas to form and this can also increase the bloating.  Drink plenty of fluids but you should avoid alcoholic beverages for 24 hours.  ACTIVITY: Your care partner should take you home directly after the procedure.  You should plan to take it easy, moving slowly for the rest of the day.  You can resume normal activity the day after the procedure however you should NOT DRIVE or use heavy machinery for 24 hours (because of the sedation medicines used during the test).     SYMPTOMS TO REPORT IMMEDIATELY: A gastroenterologist can be reached at any hour.  During normal business hours, 8:30 AM to 5:00 PM Monday through Friday, call [phone].  After hours and on weekends, please call the GI answering service at [phone] who will take a message and have the physician on call contact you.   Following lower endoscopy (colonoscopy or flexible sigmoidoscopy):  Excessive amounts of blood in the stool  Significant tenderness or worsening of abdominal pains  Swelling of the abdomen that is new, acute  Fever of 100F or higher  Following upper endoscopy (EGD)  Vomiting of blood or coffee ground material  New chest pain or pain under the shoulder blades  Painful or persistently difficult swallowing  New shortness of breath  Fever of 100F or higher  Black, tarry-looking stools  FOLLOW UP: If any biopsies were taken you will be contacted by phone or by letter within the next 1-3 weeks.  Call your gastroenterologist if you have not heard about the biopsies in 3 weeks.  Our staff will call the home number listed on your records the next business day following your procedure to check on you and address any questions or concerns that you may have at that time regarding the information given to you following your procedure. This is a courtesy call and so if there is no answer at the home number and we have not heard from you through the emergency  physician on call, we will assume that you have returned to your regular daily activities without incident.  SIGNATURES/CONFIDENTIALITY: You and/or your care partner have signed paperwork which will be entered into your electronic medical record.  These signatures attest to the fact that that the information above on your After Visit Summary has been reviewed and is understood.  Full responsibility of the confidentiality of this discharge information lies with you and/or your care-partner.   Please follow all discharge  instructions given to you by the recovery room nurse. If you have any questions or problems after discharge please call one of the numbers listed above. You will receive a phone call in the am to see how you are doing and answer any questions you may have. Thank you for choosing [facility] for your health care needs.

## 2012-02-29 ENCOUNTER — Telehealth: Payer: Self-pay | Admitting: *Deleted

## 2012-02-29 NOTE — Telephone Encounter (Signed)
No answer left message to call office if questions or concerns. 

## 2012-03-05 ENCOUNTER — Encounter: Payer: Self-pay | Admitting: Gastroenterology

## 2012-05-10 ENCOUNTER — Emergency Department (HOSPITAL_COMMUNITY)
Admission: EM | Admit: 2012-05-10 | Discharge: 2012-05-10 | Disposition: A | Discharge status: Home / Self Care | Payer: 59 | Attending: Emergency Medicine | Admitting: Emergency Medicine

## 2012-05-10 DIAGNOSIS — K219 Gastro-esophageal reflux disease without esophagitis: Secondary | ICD-10-CM | POA: Insufficient documentation

## 2012-05-10 DIAGNOSIS — M1990 Unspecified osteoarthritis, unspecified site: Secondary | ICD-10-CM | POA: Insufficient documentation

## 2012-05-10 DIAGNOSIS — F19939 Other psychoactive substance use, unspecified with withdrawal, unspecified: Secondary | ICD-10-CM | POA: Insufficient documentation

## 2012-05-10 DIAGNOSIS — I10 Essential (primary) hypertension: Secondary | ICD-10-CM | POA: Insufficient documentation

## 2012-05-10 DIAGNOSIS — E7800 Pure hypercholesterolemia, unspecified: Secondary | ICD-10-CM | POA: Insufficient documentation

## 2012-05-10 DIAGNOSIS — F1920 Other psychoactive substance dependence, uncomplicated: Secondary | ICD-10-CM | POA: Insufficient documentation

## 2012-05-10 DIAGNOSIS — D649 Anemia, unspecified: Secondary | ICD-10-CM | POA: Insufficient documentation

## 2012-05-10 DIAGNOSIS — K859 Acute pancreatitis without necrosis or infection, unspecified: Secondary | ICD-10-CM | POA: Insufficient documentation

## 2012-05-10 LAB — CBC WITH DIFFERENTIAL/PLATELET
Basophils Absolute: 0 10*3/uL (ref 0.0–0.1)
Basophils Relative: 0 % (ref 0–1)
Eosinophils Absolute: 0.3 10*3/uL (ref 0.0–0.7)
Eosinophils Relative: 3 % (ref 0–5)
HCT: 37.3 % (ref 36.0–46.0)
MCHC: 33.2 g/dL (ref 30.0–36.0)
MCV: 85.4 fL (ref 78.0–100.0)
Monocytes Absolute: 0.4 10*3/uL (ref 0.1–1.0)
Platelets: 309 10*3/uL (ref 150–400)
RDW: 14 % (ref 11.5–15.5)

## 2012-05-10 LAB — COMPREHENSIVE METABOLIC PANEL
AST: 24 U/L (ref 0–37)
Albumin: 3.6 g/dL (ref 3.5–5.2)
Calcium: 9 mg/dL (ref 8.4–10.5)
Creatinine, Ser: 0.84 mg/dL (ref 0.50–1.10)
GFR calc non Af Amer: 76 mL/min — ABNORMAL LOW (ref >90)
Total Protein: 6.9 g/dL (ref 6.0–8.3)

## 2012-05-10 LAB — URINE MICROSCOPIC-ADD ON

## 2012-05-10 LAB — URINALYSIS, ROUTINE W REFLEX MICROSCOPIC
Glucose, UA: NEGATIVE mg/dL
Hgb urine dipstick: NEGATIVE
Specific Gravity, Urine: 1.019 (ref 1.005–1.030)
Urobilinogen, UA: 1 mg/dL (ref 0.0–1.0)
pH: 7 (ref 5.0–8.0)

## 2012-05-10 NOTE — ED Provider Notes (Signed)
History     CSN: [id number]  Arrival date & time 05/10/12  1539   First MD Initiated Contact with Patient 05/10/12 1611      Chief Complaint  Patient presents with  . Fatigue  . Nausea    (Consider location/radiation/quality/duration/timing/severity/associated sxs/prior treatment) HPI Pt presents with increased HA, nausea, fatigue and difficulty concentrating after she abruptly stopped taking her Zoloft 2 days ago and started taking Pristiq today. No fever chills, CP, SOB, abd pain, V/D or urinary symptoms. No focal weakness or sensory changes Past Medical History  Diagnosis Date  . Diverticulosis of colon (without mention of hemorrhage)   . IBS (irritable bowel syndrome)   . Unspecified gastritis and gastroduodenitis without mention of hemorrhage   . Family history of malignant neoplasm of gastrointestinal tract   . Hypertension   . Hypercholesterolemia   . Depression   . Chronic headaches   . Vitamin B12 deficiency   . Anemia     Iron def   . Bacterial overgrowth syndrome   . Pancreatitis   . GERD (gastroesophageal reflux disease)   . Osteoarthritis     Past Surgical History  Procedure Date  . Abdominal hysterectomy   . Appendectomy   . Cesarean section     x 4    Family History  Problem Relation Age of Onset  . Diabetes Mother   . Alcohol abuse Father   . Cirrhosis Father   . Breast cancer Maternal Aunt   . Colon cancer Maternal Aunt   . Stomach cancer Brother   . Esophageal cancer Brother   . Diabetes Brother   . Diabetes Maternal Uncle   . Heart disease      grandfather/grandmother    History  Substance Use Topics  . Smoking status: Never Smoker   . Smokeless tobacco: Never Used  . Alcohol Use: No    OB History    Grav Para Term Preterm Abortions TAB SAB Ect Mult Living                  Review of Systems  Constitutional: Positive for fatigue. Negative for fever and chills.  HENT: Negative for neck pain.   Respiratory: Negative for  cough and shortness of breath.   Cardiovascular: Negative for chest pain and palpitations.  Gastrointestinal: Positive for nausea. Negative for vomiting and abdominal pain.  Genitourinary: Negative for dysuria, frequency and flank pain.  Musculoskeletal: Negative for back pain.  Skin: Negative for rash and wound.  Neurological: Positive for headaches. Negative for dizziness, syncope, light-headedness and numbness.  Psychiatric/Behavioral: Positive for dysphoric mood. Negative for suicidal ideas.    Allergies  Promethazine hcl  Home Medications   Current Outpatient Rx  Name Route Sig Dispense Refill  . ATENOLOL 100 MG PO TABS Oral Take 100 mg by mouth daily.    Marland Kitchen CLONAZEPAM 1 MG PO TABS Oral Take 0.5-1 mg by mouth 2 (two) times daily as needed. For anxiety    . VITAMIN B-12 IJ Intramuscular Inject 1 mL into the muscle. Every 2 weeks    . DESVENLAFAXINE SUCCINATE ER 50 MG PO TB24 Oral Take 50 mg by mouth daily.    Marland Kitchen ESOMEPRAZOLE MAGNESIUM 40 MG PO CPDR Oral Take 40 mg by mouth 2 (two) times daily.     Marland Kitchen FERROUS GLUCONATE 324 (38 FE) MG PO TABS Oral Take 324 mg by mouth daily with breakfast.    . CLINDINIUM-CHLORDIAZEPOXIDE 2.5-5 MG PO CAPS Oral Take 1 capsule by mouth 3 (three)  times daily with meals. 90 capsule 3    BP 148/89  Pulse 56  Temp 99.1 F (37.3 C) (Oral)  Resp 16  SpO2 100%  Physical Exam  Nursing note and vitals reviewed. Constitutional: She is oriented to person, place, and time. She appears well-developed and well-nourished. No distress.  HENT:  Head: Normocephalic and atraumatic.  Mouth/Throat: Oropharynx is clear and moist.  Eyes: EOM are normal. Pupils are equal, round, and reactive to light.  Neck: Normal range of motion. Neck supple.  Cardiovascular: Normal rate and regular rhythm.   Pulmonary/Chest: Effort normal and breath sounds normal. No respiratory distress. She has no wheezes. She has no rales.  Abdominal: Soft. Bowel sounds are normal. She exhibits  no mass. There is no tenderness. There is no rebound and no guarding.  Musculoskeletal: Normal range of motion. She exhibits no edema and no tenderness.  Neurological: She is alert and oriented to person, place, and time.       5/5 motor in all ext, sensation intact  Skin: Skin is warm and dry. No rash noted. No erythema.  Psychiatric:       Flat affect    ED Course  Procedures (including critical care time)  Labs Reviewed  COMPREHENSIVE METABOLIC PANEL - Abnormal; Notable for the following:    Glucose, Bld 132 (*)     ALT 37 (*)     Total Bilirubin 0.1 (*)     GFR calc non Af Amer 76 (*)     GFR calc Af Amer 88 (*)     All other components within normal limits  URINALYSIS, ROUTINE W REFLEX MICROSCOPIC - Abnormal; Notable for the following:    Leukocytes, UA TRACE (*)     All other components within normal limits  URINE MICROSCOPIC-ADD ON - Abnormal; Notable for the following:    Squamous Epithelial / LPF FEW (*)     Bacteria, UA FEW (*)     All other components within normal limits  CBC WITH DIFFERENTIAL   No results found.   1. Medication withdrawal      Date: 05/10/2012  Rate: 57  Rhythm: sinus bradycardia  QRS Axis: normal  Intervals: normal  ST/T Wave abnormalities: nonspecific T wave changes  Conduction Disutrbances:none  Narrative Interpretation:   Old EKG Reviewed: unchanged    MDM  Suspect Zoloft discontinuation syndrome or side effect of newly started pristiq. Will check basic labs and urine.   Advised to f/u with PMD for zoloft taper and to return for worsening symptoms or concerns      Loren Racer, MD 05/10/12 0865

## 2012-05-10 NOTE — ED Notes (Signed)
Pt sts she can't urinate right now

## 2012-05-10 NOTE — ED Notes (Signed)
1st attempt to obtain labs, pt is in the restroom.

## 2012-05-10 NOTE — ED Notes (Signed)
Pt has started taking Pristique this am.  Was told to stop taking her Zoloft yesterday.  States she was taking the generic and brand named versions b/c they were prescribed by two different MD's.  Had been taking them for about a year.  Has been having issues w/fatigue, headaches and memory for a about a year as well but states she is more fatigued since starting the new medicine this am.

## 2012-11-15 ENCOUNTER — Encounter (HOSPITAL_COMMUNITY): Payer: Self-pay | Admitting: Emergency Medicine

## 2012-11-15 ENCOUNTER — Emergency Department (INDEPENDENT_AMBULATORY_CARE_PROVIDER_SITE_OTHER)
Admission: EM | Admit: 2012-11-15 | Discharge: 2012-11-15 | Disposition: A | Discharge status: Home / Self Care | Payer: 59 | Source: Home / Self Care | Attending: Family Medicine | Admitting: Family Medicine

## 2012-11-15 ENCOUNTER — Emergency Department (INDEPENDENT_AMBULATORY_CARE_PROVIDER_SITE_OTHER): Payer: 59

## 2012-11-15 DIAGNOSIS — S1093XA Contusion of unspecified part of neck, initial encounter: Secondary | ICD-10-CM

## 2012-11-15 DIAGNOSIS — S0003XA Contusion of scalp, initial encounter: Secondary | ICD-10-CM

## 2012-11-15 DIAGNOSIS — S0083XA Contusion of other part of head, initial encounter: Secondary | ICD-10-CM

## 2012-11-15 NOTE — ED Notes (Signed)
Pt is here for inj to right side of cheek/face onset 1200 Reports she fell of a stepping stool about 53ft high onto kitchen tile Right cheek is swollen w/bruising Denies: LOC, nose pain, back pain, neck pain  She is alert and oriented w/no signs of acute distress.

## 2012-11-15 NOTE — ED Provider Notes (Signed)
History     CSN: [id number]  Arrival date & time 11/15/12  1635   First MD Initiated Contact with Patient 11/15/12 1732      Chief Complaint  Patient presents with  . Facial Injury    (Consider location/radiation/quality/duration/timing/severity/associated sxs/prior treatment) Patient is a 57 y.o. female presenting with facial injury. The history is provided by the patient, the spouse and a relative.  Facial Injury  The incident occurred today. The incident occurred at home. The injury mechanism was a fall. Context: on a step stool and fell off striking right face on floor. There is an injury to the face. The pain is mild. Pertinent negatives include no visual disturbance, no headaches, no decreased responsiveness and no loss of consciousness. She has been behaving normally.    Past Medical History  Diagnosis Date  . Diverticulosis of colon (without mention of hemorrhage)   . IBS (irritable bowel syndrome)   . Unspecified gastritis and gastroduodenitis without mention of hemorrhage   . Family history of malignant neoplasm of gastrointestinal tract   . Hypertension   . Hypercholesterolemia   . Depression   . Chronic headaches   . Vitamin B12 deficiency   . Anemia     Iron def   . Bacterial overgrowth syndrome   . Pancreatitis   . GERD (gastroesophageal reflux disease)   . Osteoarthritis     Past Surgical History  Procedure Laterality Date  . Abdominal hysterectomy    . Appendectomy    . Cesarean section      x 4    Family History  Problem Relation Age of Onset  . Diabetes Mother   . Alcohol abuse Father   . Cirrhosis Father   . Breast cancer Maternal Aunt   . Colon cancer Maternal Aunt   . Stomach cancer Brother   . Esophageal cancer Brother   . Diabetes Brother   . Diabetes Maternal Uncle   . Heart disease      grandfather/grandmother    History  Substance Use Topics  . Smoking status: Never Smoker   . Smokeless tobacco: Never Used  . Alcohol Use: No      OB History   Grav Para Term Preterm Abortions TAB SAB Ect Mult Living                  Review of Systems  Constitutional: Negative.  Negative for decreased responsiveness.  HENT: Positive for facial swelling.   Eyes: Negative for visual disturbance.  Musculoskeletal: Negative.   Skin: Negative.   Neurological: Negative.  Negative for loss of consciousness and headaches.    Allergies  Promethazine hcl  Home Medications   Current Outpatient Rx  Name  Route  Sig  Dispense  Refill  . atenolol (TENORMIN) 100 MG tablet   Oral   Take 100 mg by mouth daily.         Marland Kitchen EXPIRED: clidinium-chlordiazePOXIDE (LIBRAX) 2.5-5 MG per capsule   Oral   Take 1 capsule by mouth 3 (three) times daily with meals.   90 capsule   3   . clonazePAM (KLONOPIN) 1 MG tablet   Oral   Take 0.5-1 mg by mouth 2 (two) times daily as needed. For anxiety         . Cyanocobalamin (VITAMIN B-12 IJ)   Intramuscular   Inject 1 mL into the muscle. Every 2 weeks         . desvenlafaxine (PRISTIQ) 50 MG 24 hr tablet   Oral  Take 50 mg by mouth daily.         Marland Kitchen esomeprazole (NEXIUM) 40 MG capsule   Oral   Take 40 mg by mouth 2 (two) times daily.          . ferrous gluconate (FERGON) 324 MG tablet   Oral   Take 324 mg by mouth daily with breakfast.           BP 117/75  Pulse 53  Temp(Src) 98.3 F (36.8 C) (Oral)  Resp 20  SpO2 95%  Physical Exam  Nursing note and vitals reviewed. Constitutional: She is oriented to person, place, and time. She appears well-developed and well-nourished. No distress.  HENT:  Head: Normocephalic. Head is with contusion.    Right Ear: External ear normal.  Left Ear: External ear normal.  Mouth/Throat: Oropharynx is clear and moist.  Eyes: Conjunctivae are normal. Pupils are equal, round, and reactive to light.  Neck: Normal range of motion. Neck supple.  Pulmonary/Chest: She exhibits no tenderness.  Musculoskeletal: Normal range of motion.   Neurological: She is alert and oriented to person, place, and time.  Skin: Skin is warm and dry.  Psychiatric: She has a normal mood and affect. Her behavior is normal. Judgment and thought content normal.    ED Course  Procedures (including critical care time)  Labs Reviewed - No data to display Dg Facial Bones Complete  11/15/2012  *RADIOLOGY REPORT*  Clinical Data: Right facial injury and pain.  FACIAL BONES COMPLETE 3+V  Comparison: None  Findings: There is no evidence of fracture, subluxation or dislocation. The paranasal sinuses and mastoid air cells are clear. No focal bony lesions identified. No unexpected radiopaque foreign bodies are identified.  IMPRESSION: No evidence of acute bony abnormality.   Original Report Authenticated By: Harmon Pier, M.D.      1. Facial contusion, initial encounter       MDM  X-rays reviewed and report per radiologist.         Linna Hoff, MD 11/15/12 2029

## 2013-06-23 ENCOUNTER — Other Ambulatory Visit: Payer: Self-pay | Admitting: Internal Medicine

## 2013-06-25 ENCOUNTER — Other Ambulatory Visit: Payer: Self-pay | Admitting: Internal Medicine

## 2013-06-25 NOTE — Telephone Encounter (Signed)
RX called in .

## 2013-07-10 ENCOUNTER — Encounter: Payer: Self-pay | Admitting: Internal Medicine

## 2013-07-11 ENCOUNTER — Ambulatory Visit: Payer: Self-pay | Admitting: Emergency Medicine

## 2013-07-11 DIAGNOSIS — Z0000 Encounter for general adult medical examination without abnormal findings: Secondary | ICD-10-CM

## 2013-08-14 ENCOUNTER — Encounter: Payer: Self-pay | Admitting: Emergency Medicine

## 2013-08-14 ENCOUNTER — Ambulatory Visit (INDEPENDENT_AMBULATORY_CARE_PROVIDER_SITE_OTHER): Payer: 59 | Admitting: Emergency Medicine

## 2013-08-14 VITALS — BP 112/62 | HR 68 | Temp 98.6°F | Resp 16 | Wt 174.0 lb

## 2013-08-14 DIAGNOSIS — R7309 Other abnormal glucose: Secondary | ICD-10-CM

## 2013-08-14 DIAGNOSIS — E782 Mixed hyperlipidemia: Secondary | ICD-10-CM

## 2013-08-14 DIAGNOSIS — I10 Essential (primary) hypertension: Secondary | ICD-10-CM

## 2013-08-14 DIAGNOSIS — R252 Cramp and spasm: Secondary | ICD-10-CM

## 2013-08-14 DIAGNOSIS — R259 Unspecified abnormal involuntary movements: Secondary | ICD-10-CM

## 2013-08-14 DIAGNOSIS — R251 Tremor, unspecified: Secondary | ICD-10-CM

## 2013-08-14 LAB — CBC WITH DIFFERENTIAL/PLATELET
BASOS PCT: 1 % (ref 0–1)
Basophils Absolute: 0.1 10*3/uL (ref 0.0–0.1)
EOS ABS: 0 10*3/uL (ref 0.0–0.7)
Eosinophils Relative: 0 % (ref 0–5)
HCT: 37.3 % (ref 36.0–46.0)
Hemoglobin: 12.6 g/dL (ref 12.0–15.0)
LYMPHS ABS: 1.3 10*3/uL (ref 0.7–4.0)
Lymphocytes Relative: 16 % (ref 12–46)
MCH: 28.6 pg (ref 26.0–34.0)
MCHC: 33.8 g/dL (ref 30.0–36.0)
MCV: 84.8 fL (ref 78.0–100.0)
Monocytes Absolute: 0.5 10*3/uL (ref 0.1–1.0)
Monocytes Relative: 6 % (ref 3–12)
NEUTROS ABS: 6.6 10*3/uL (ref 1.7–7.7)
NEUTROS PCT: 77 % (ref 43–77)
PLATELETS: 286 10*3/uL (ref 150–400)
RBC: 4.4 MIL/uL (ref 3.87–5.11)
RDW: 13.6 % (ref 11.5–15.5)
WBC: 8.4 10*3/uL (ref 4.0–10.5)

## 2013-08-14 MED ORDER — CYANOCOBALAMIN 1000 MCG/ML IJ SOLN: 1000.0000 ug | Freq: Once | INTRAMUSCULAR | Status: DC

## 2013-08-14 NOTE — Patient Instructions (Signed)
Muscle Cramps and Spasms Muscle cramps and spasms are when muscles tighten by themselves. They usually get better within minutes. Muscle cramps are painful. They are usually stronger and last longer than muscle spasms. Muscle spasms may or may not be painful. They can last a few seconds or much longer. HOME CARE  Drink enough fluid to keep your pee (urine) clear or pale yellow.  Massage, stretch, and relax the muscle.  Use a warm towel, heating pad, or warm shower water on tight muscles.  Place ice on the muscle if it is tender or in pain.  Put ice in a plastic bag.  Place a towel between your skin and the bag.  Leave the ice on for 15-20 minutes, 03-04 times a day.  Only take medicine as told by your doctor. GET HELP RIGHT AWAY IF:  Your cramps or spasms get worse, happen more often, or do not get better with time. MAKE SURE YOU:  Understand these instructions.  Will watch your condition.  Will get help right away if you are not doing well or get worse. Document Released: 06/22/2008 Document Revised: 11/04/2012 Document Reviewed: 06/26/2012 [facility] Patient Information 2014 [place], [place]. Tremor Tremor is a rhythmic, involuntary muscular contraction characterized by oscillations (to-and-fro movements) of a part of the body. The most common of all involuntary movements, tremor can affect various body parts such as the hands, head, facial structures, vocal cords, trunk, and legs; most tremors, however, occur in the hands. Tremor often accompanies neurological disorders associated with aging. Although the disorder is not life-threatening, it can be responsible for functional disability and social embarrassment. TREATMENT  There are many types of tremor and several ways in which tremor is classified. The most common classification is by behavioral context or position. There are five categories of tremor within this classification: resting, postural, kinetic, task-specific, and  psychogenic. Resting or static tremor occurs when the muscle is at rest, for example when the hands are lying on the lap. This type of tremor is often seen in patients with Parkinson's disease. Postural tremor occurs when a patient attempts to maintain posture, such as holding the hands outstretched. Postural tremors include physiological tremor, essential tremor, tremor with basal ganglia disease (also seen in patients with Parkinson's disease), cerebellar postural tremor, tremor with peripheral neuropathy, post-traumatic tremor, and alcoholic tremor. Kinetic or intention (action) tremor occurs during purposeful movement, for example during finger-to-nose testing. Task-specific tremor appears when performing goal-oriented tasks such as handwriting, speaking, or standing. This group consists of primary writing tremor, vocal tremor, and orthostatic tremor. Psychogenic tremor occurs in both older and younger patients. The key feature of this tremor is that it dramatically lessens or disappears when the patient is distracted. PROGNOSIS There are some treatment options available for tremor; the appropriate treatment depends on accurate diagnosis of the cause. Some tremors respond to treatment of the underlying condition, for example in some cases of psychogenic tremor treating the patient's underlying mental problem may cause the tremor to disappear. Also, patients with tremor due to Parkinson's disease may be treated with Levodopa drug therapy. Symptomatic drug therapy is available for several other tremors as well. For those cases of tremor in which there is no effective drug treatment, physical measures such as teaching the patient to brace the affected limb during the tremor are sometimes useful. Surgical intervention such as thalamotomy or deep brain stimulation may be useful in certain cases. Document Released: 06/30/2002 Document Revised: 10/02/2011 Document Reviewed: 07/10/2005 [employer] Patient Information  2014 [place]  

## 2013-08-14 NOTE — Progress Notes (Signed)
Subjective:    Patient ID: Helen Cabrera, female    DOB: Oct 27, 1955, 58 y.o.   MRN: [id number]  HPI Comments: 58 yo femlae presents for 3 month F/U for HTN, Cholesterol, Pre-Dm, D. deficient Husband is not sure if she is taking Chol RX or not LAST LABS T 254 TG 161 L 162 A1C 5.9 D90 She is not eating healthy all the time. She is still eating icecream routinely. She is not exercising.   She notices occasional twitch x >6 months in her extremities. She denies any trigger. Husband notes she has neurologist appointment in the future in regards to memory but no recent eval of "twitch". She denies any change with strength or energy level. She does not know when the twitch is going to occur and usually happens in only 1 extremity at a time. It does not have a pattern and may go from hand to foot or stay in the hand. She notes more often on the left side of her body.  Hypertension  Hyperlipidemia     Medication List       This list is accurate as of: 08/14/13 11:59 PM.  Always use your most recent med list.               atenolol 100 MG tablet  Commonly known as:  TENORMIN  Take 100 mg by mouth daily.     clidinium-chlordiazePOXIDE 5-2.5 MG per capsule  Commonly known as:  LIBRAX  Take 1 capsule by mouth 3 (three) times daily with meals.     clonazePAM 1 MG tablet  Commonly known as:  KLONOPIN  daily at night     cyanocobalamin 1000 MCG/ML injection  Commonly known as:  (VITAMIN B-12)  Inject 1 mL (1,000 mcg total) into the muscle once.     donepezil 10 MG tablet  Commonly known as:  ARICEPT  Take 10 mg by mouth at bedtime.     Vitamin D3 50000 UNITS Caps  Take 50,000 Units by mouth once a week.       ALLERGIES Amitriptyline; Carafate; Lexapro; Paxil; Prednisone; Promethazine hcl; and Zoloft  Past Medical History  Diagnosis Date  . Diverticulosis of colon (without mention of hemorrhage)   . IBS (irritable bowel syndrome)   . Unspecified gastritis and gastroduodenitis  without mention of hemorrhage   . Family history of malignant neoplasm of gastrointestinal tract   . Hypertension   . Hypercholesterolemia   . Depression   . Chronic headaches   . Vitamin B12 deficiency   . Anemia     Iron def   . Bacterial overgrowth syndrome   . Pancreatitis   . GERD (gastroesophageal reflux disease)   . Osteoarthritis        Review of Systems  Neurological: Positive for tremors.  All other systems reviewed and are negative.   BP 112/62  Pulse 68  Temp(Src) 98.6 F (37 C) (Temporal)  Resp 16  Wt 174 lb (78.926 kg)     Objective:   Physical Exam  Nursing note and vitals reviewed. Constitutional: She is oriented to person, place, and time. She appears well-developed and well-nourished. No distress.  HENT:  Head: Normocephalic and atraumatic.  Right Ear: External ear normal.  Left Ear: External ear normal.  Nose: Nose normal.  Mouth/Throat: Oropharynx is clear and moist.  Eyes: Conjunctivae and EOM are normal.  Neck: Normal range of motion. Neck supple. No JVD present. No thyromegaly present.  Cardiovascular: Normal rate, regular rhythm, normal  heart sounds and intact distal pulses.   Pulmonary/Chest: Effort normal and breath sounds normal.  Abdominal: Soft. Bowel sounds are normal. She exhibits no distension and no mass. There is no tenderness. There is no rebound and no guarding.  Musculoskeletal: Normal range of motion. She exhibits no edema and no tenderness.  Lymphadenopathy:    She has no cervical adenopathy.  Neurological: She is alert and oriented to person, place, and time. She has normal reflexes. No cranial nerve deficit. Coordination normal.  1 + episode of "twitch" on left arm. Patient appeared to have almost terete like appearing single jerk  Skin: Skin is warm and dry. No rash noted. No erythema. No pallor.  Psychiatric: She has a normal mood and affect. Her behavior is normal. Judgment and thought content normal.           Assessment & Plan:  1.  3 month F/U for HTN, Cholesterol, Pre-Dm, D. Deficient. Needs healthy diet, cardio QD and obtain healthy weight. Check Labs, Check BP if >130/80 call office. Advised decrease ice cream and verify RX list 2. ? Tremor/ spasm of extremities- check lab if neg make appointment with neuro for further eval

## 2013-08-15 LAB — BASIC METABOLIC PANEL WITH GFR
BUN: 23 mg/dL (ref 6–23)
CALCIUM: 9.1 mg/dL (ref 8.4–10.5)
CO2: 31 meq/L (ref 19–32)
Chloride: 104 mEq/L (ref 96–112)
Creat: 0.88 mg/dL (ref 0.50–1.10)
GFR, Est African American: 84 mL/min
GFR, Est Non African American: 73 mL/min
GLUCOSE: 103 mg/dL — AB (ref 70–99)
POTASSIUM: 4.6 meq/L (ref 3.5–5.3)
SODIUM: 141 meq/L (ref 135–145)

## 2013-08-15 LAB — HEPATIC FUNCTION PANEL
ALBUMIN: 4.1 g/dL (ref 3.5–5.2)
ALK PHOS: 92 U/L (ref 39–117)
ALT: 14 U/L (ref 0–35)
AST: 19 U/L (ref 0–37)
BILIRUBIN INDIRECT: 0.3 mg/dL (ref 0.0–0.9)
Bilirubin, Direct: 0.1 mg/dL (ref 0.0–0.3)
TOTAL PROTEIN: 6.9 g/dL (ref 6.0–8.3)
Total Bilirubin: 0.4 mg/dL (ref 0.3–1.2)

## 2013-08-15 LAB — TSH: TSH: 0.502 u[IU]/mL (ref 0.350–4.500)

## 2013-08-15 LAB — LIPID PANEL
Cholesterol: 248 mg/dL — ABNORMAL HIGH (ref 0–200)
HDL: 66 mg/dL (ref >39)
LDL CALC: 160 mg/dL — AB (ref 0–99)
Total CHOL/HDL Ratio: 3.8 Ratio
Triglycerides: 112 mg/dL (ref <150)
VLDL: 22 mg/dL (ref 0–40)

## 2013-08-15 LAB — INSULIN, FASTING: INSULIN FASTING, SERUM: 18 u[IU]/mL (ref 3–28)

## 2013-08-15 LAB — HEMOGLOBIN A1C
HEMOGLOBIN A1C: 5.7 % — AB (ref <5.7)
MEAN PLASMA GLUCOSE: 117 mg/dL — AB (ref <117)

## 2013-08-21 ENCOUNTER — Other Ambulatory Visit: Payer: Self-pay | Admitting: Emergency Medicine

## 2013-08-21 MED ORDER — ATORVASTATIN CALCIUM 40 MG PO TABS: 40.0000 mg | ORAL_TABLET | Freq: Every day | ORAL | Status: DC

## 2013-08-25 ENCOUNTER — Other Ambulatory Visit: Payer: Self-pay | Admitting: Physician Assistant

## 2013-09-03 ENCOUNTER — Other Ambulatory Visit: Payer: Self-pay | Admitting: Internal Medicine

## 2013-10-09 ENCOUNTER — Ambulatory Visit (INDEPENDENT_AMBULATORY_CARE_PROVIDER_SITE_OTHER): Payer: 59 | Admitting: Internal Medicine

## 2013-10-09 ENCOUNTER — Encounter: Payer: Self-pay | Admitting: Internal Medicine

## 2013-10-09 VITALS — BP 128/72 | HR 60 | Temp 97.5°F | Resp 16 | Ht 64.75 in | Wt 178.2 lb

## 2013-10-09 DIAGNOSIS — R7309 Other abnormal glucose: Secondary | ICD-10-CM

## 2013-10-09 DIAGNOSIS — I10 Essential (primary) hypertension: Secondary | ICD-10-CM

## 2013-10-09 DIAGNOSIS — E559 Vitamin D deficiency, unspecified: Secondary | ICD-10-CM | POA: Insufficient documentation

## 2013-10-09 DIAGNOSIS — Z79899 Other long term (current) drug therapy: Secondary | ICD-10-CM

## 2013-10-09 DIAGNOSIS — E785 Hyperlipidemia, unspecified: Secondary | ICD-10-CM

## 2013-10-09 LAB — CBC WITH DIFFERENTIAL/PLATELET
BASOS ABS: 0.1 10*3/uL (ref 0.0–0.1)
BASOS PCT: 1 % (ref 0–1)
Eosinophils Absolute: 0.2 10*3/uL (ref 0.0–0.7)
Eosinophils Relative: 3 % (ref 0–5)
HCT: 36.5 % (ref 36.0–46.0)
Hemoglobin: 12.2 g/dL (ref 12.0–15.0)
LYMPHS PCT: 30 % (ref 12–46)
Lymphs Abs: 1.7 10*3/uL (ref 0.7–4.0)
MCH: 28.3 pg (ref 26.0–34.0)
MCHC: 33.4 g/dL (ref 30.0–36.0)
MCV: 84.7 fL (ref 78.0–100.0)
Monocytes Absolute: 0.5 10*3/uL (ref 0.1–1.0)
Monocytes Relative: 8 % (ref 3–12)
NEUTROS ABS: 3.4 10*3/uL (ref 1.7–7.7)
NEUTROS PCT: 58 % (ref 43–77)
PLATELETS: 253 10*3/uL (ref 150–400)
RBC: 4.31 MIL/uL (ref 3.87–5.11)
RDW: 13.7 % (ref 11.5–15.5)
WBC: 5.8 10*3/uL (ref 4.0–10.5)

## 2013-10-09 LAB — HEMOGLOBIN A1C
Hgb A1c MFr Bld: 5.6 % (ref <5.7)
MEAN PLASMA GLUCOSE: 114 mg/dL (ref <117)

## 2013-10-09 NOTE — Progress Notes (Signed)
Patient ID: Helen Cabrera, female   DOB: 28-May-1956, 58 y.o.   MRN: [id number]    This very nice 58 y.o. MWF presents for 3 month follow up with Hypertension, Hyperlipidemia, Pre-Diabetes,  and Vitamin D Deficiency. Patient is also being followed by Gala Murdoch NP at Dr Arvil Persons office for Depression vs SDAT. Patient seems stable memory-wise and in her ADL's. She drives some and prepares some meals. Most of her problems are with ST recall.    HTN predates since 2005. BP has been controlled at home. Today's BP: 128/72 mmHg . Patient denies any cardiac type chest pain, palpitations, dyspnea/orthopnea/PND, dizziness, claudication, or dependent edema.   Hyperlipidemia was not controlled with diet & meds. Last lipids as below are not at goal and Atorvastatin was added. Patient denies myalgias or other med SE's.  Lab Results  Component Value Date   CHOL 248* 08/14/2013   HDL 66 08/14/2013   LDLCALC 160* 08/14/2013   TRIG 112 08/14/2013   CHOLHDL 3.8 08/14/2013    Also, the patient has history of PreDiabetes with A1c 6.0 in 2010 and  with last A1c of 5.9% in Sept 2014 and 5.7% in Jan 2015.  Patient denies any symptoms of reactive hypoglycemia, diabetic polys, paresthesias or visual blurring.   Further, Patient has history of Vitamin D Deficiency of 15 in 2008 with last vitamin D of 90 in Sept 2014. Patient supplements vitamin D without any suspected side-effects.  Medication Sig  . atenolol (TENORMIN) 100 MG tablet Take 100 mg by mouth daily.  Marland Kitchen atorvastatin (LIPITOR) 40 MG tablet Take 1 tablet (40 mg total) by mouth daily.  . Cholecalciferol (VITAMIN D3) 50000 UNITS CAPS Take 50,000 Units by mouth once a week.  . clonazePAM (KLONOPIN) 1 MG tablet TAKE 1 TABLET BY MOUTH TWICE DAILY  . cyanocobalamin (,VITAMIN B-12,) 1000 MCG/ML injection INJECT 1ML EVERY 2 WEEKS INTRAMUSCULARLY  . donepezil (ARICEPT) 10 MG tablet Take 10 mg by mouth at bedtime.   Allergies  Allergen Reactions  . Amitriptyline  Other (See Comments)    dysphoria  . Carafate [Sucralfate] Other (See Comments)    constipation  . Lexapro [Escitalopram Oxalate]   . Paxil [Paroxetine Hcl]   . Prednisone   . Promethazine Hcl Nausea Only  . Zoloft [Sertraline Hcl]     PMHx:   Past Medical History  Diagnosis Date  . Diverticulosis of colon (without mention of hemorrhage)   . IBS (irritable bowel syndrome)   . Unspecified gastritis and gastroduodenitis without mention of hemorrhage   . Family history of malignant neoplasm of gastrointestinal tract   . Hypertension   . Hypercholesterolemia   . Depression   . Chronic headaches   . Vitamin B12 deficiency   . Anemia     Iron def   . Bacterial overgrowth syndrome   . Pancreatitis   . GERD (gastroesophageal reflux disease)   . Osteoarthritis    FHx:    Reviewed / unchanged  SHx:    Reviewed / unchanged   Systems Review: Constitutional: Denies fever, chills, wt changes, headaches, insomnia, fatigue, night sweats, change in appetite. Eyes: Denies redness, blurred vision, diplopia, discharge, itchy, watery eyes.  ENT: Denies discharge, congestion, post nasal drip, epistaxis, sore throat, earache, hearing loss, dental pain, tinnitus, vertigo, sinus pain, snoring.  CV: Denies chest pain, palpitations, irregular heartbeat, syncope, dyspnea, diaphoresis, orthopnea, PND, claudication, edema. Respiratory: denies cough, dyspnea, DOE, pleurisy, hoarseness, laryngitis, wheezing.  Gastrointestinal: Denies dysphagia, odynophagia, heartburn, reflux, water brash, abdominal pain  or cramps, nausea, vomiting, bloating, diarrhea, constipation, hematemesis, melena, hematochezia,  or hemorrhoids. Genitourinary: Denies dysuria, frequency, urgency, nocturia, hesitancy, discharge, hematuria, flank pain. Musculoskeletal: Denies arthralgias, myalgias, stiffness, jt. swelling, pain, limp, strain/sprain.  Skin: Denies pruritus, rash, hives, warts, acne, eczema, change in skin  lesion(s). Neuro: No weakness, tremor, incoordination, spasms, paresthesia, or pain. Psychiatric: Denies confusion, memory loss, or sensory loss. Endo: Denies change in weight, skin, hair change.  Heme/Lymph: No excessive bleeding, bruising, orenlarged lymph nodes.  Exam:  BP 128/72  Pulse 60  Temp(Src) 97.5 F (36.4 C) (Temporal)  Resp 16  Ht 5' 4.75" (1.645 m)  Wt 178 lb 3.2 oz (80.831 kg)  BMI 29.87 kg/m2  Appears well nourished - in no distress. Eyes: PERRLA, EOMs, conjunctiva no swelling or erythema. Sinuses: No frontal/maxillary tenderness ENT/Mouth: EAC's clear, TM's nl w/o erythema, bulging. Nares clear w/o erythema, swelling, exudates. Oropharynx clear without erythema or exudates. Oral hygiene is good. Tongue normal, non obstructing. Hearing intact.  Neck: Supple. Thyroid nl. Car 2+/2+ without bruits, nodes or JVD. Chest: Respirations nl with BS clear & equal w/o rales, rhonchi, wheezing or stridor.  Cor: Heart sounds normal w/ regular rate and rhythm without sig. murmurs, gallops, clicks, or rubs. Peripheral pulses normal and equal  without edema.  Abdomen: Soft & bowel sounds normal. Non-tender w/o guarding, rebound, hernias, masses, or organomegaly.  Lymphatics: Unremarkable.  Musculoskeletal: Full ROM all peripheral extremities, joint stability, 5/5 strength, and normal gait.  Skin: Warm, dry without exposed rashes, lesions, ecchymosis apparent.  Neuro: Cranial nerves intact, reflexes equal bilaterally. Sensory-motor testing grossly intact. Tendon reflexes grossly intact.  Pysch: Alert & oriented x 3. Insight and judgement nl & appropriate. No ideations.  Assessment and Plan:  1. Hypertension - Continue monitor blood pressure at home. Continue diet/meds same.  2. Hyperlipidemia - Continue diet/meds, exercise,& lifestyle modifications. Continue monitor periodic cholesterol/liver & renal functions   3. Pre-diabetes/Insulin Resistance - Continue diet, exercise,  lifestyle modifications. Monitor appropriate labs.  4. Vitamin D Deficiency - Continue supplementation.  Recommended regular exercise, BP monitoring, weight control, and discussed med and SE's. Recommended labs to assess and monitor clinical status. Further disposition pending results of labs.

## 2013-10-09 NOTE — Patient Instructions (Signed)

## 2013-10-10 LAB — HEPATIC FUNCTION PANEL
ALT: 10 U/L (ref 0–35)
AST: 14 U/L (ref 0–37)
Albumin: 3.6 g/dL (ref 3.5–5.2)
Alkaline Phosphatase: 87 U/L (ref 39–117)
BILIRUBIN DIRECT: 0.1 mg/dL (ref 0.0–0.3)
BILIRUBIN TOTAL: 0.6 mg/dL (ref 0.2–1.2)
Indirect Bilirubin: 0.5 mg/dL (ref 0.2–1.2)
Total Protein: 6 g/dL (ref 6.0–8.3)

## 2013-10-10 LAB — BASIC METABOLIC PANEL WITH GFR
BUN: 24 mg/dL — AB (ref 6–23)
CO2: 29 mEq/L (ref 19–32)
Calcium: 8.4 mg/dL (ref 8.4–10.5)
Chloride: 103 mEq/L (ref 96–112)
Creat: 0.94 mg/dL (ref 0.50–1.10)
GFR, EST AFRICAN AMERICAN: 77 mL/min
GFR, EST NON AFRICAN AMERICAN: 67 mL/min
Glucose, Bld: 97 mg/dL (ref 70–99)
POTASSIUM: 3.9 meq/L (ref 3.5–5.3)
SODIUM: 140 meq/L (ref 135–145)

## 2013-10-10 LAB — VITAMIN D 25 HYDROXY (VIT D DEFICIENCY, FRACTURES): Vit D, 25-Hydroxy: 80 ng/mL (ref 30–89)

## 2013-10-10 LAB — LIPID PANEL
CHOLESTEROL: 134 mg/dL (ref 0–200)
HDL: 54 mg/dL (ref >39)
LDL Cholesterol: 61 mg/dL (ref 0–99)
TRIGLYCERIDES: 93 mg/dL (ref <150)
Total CHOL/HDL Ratio: 2.5 Ratio
VLDL: 19 mg/dL (ref 0–40)

## 2013-10-10 LAB — TSH: TSH: 0.702 u[IU]/mL (ref 0.350–4.500)

## 2013-10-10 LAB — INSULIN, FASTING: Insulin fasting, serum: 12 u[IU]/mL (ref 3–28)

## 2013-10-10 LAB — MAGNESIUM: MAGNESIUM: 1.8 mg/dL (ref 1.5–2.5)

## 2013-11-04 ENCOUNTER — Ambulatory Visit: Payer: Self-pay | Admitting: Emergency Medicine

## 2013-11-10 ENCOUNTER — Ambulatory Visit: Payer: Self-pay | Admitting: Emergency Medicine

## 2013-11-12 ENCOUNTER — Ambulatory Visit (INDEPENDENT_AMBULATORY_CARE_PROVIDER_SITE_OTHER): Payer: 59 | Admitting: Emergency Medicine

## 2013-11-12 ENCOUNTER — Encounter: Payer: Self-pay | Admitting: Emergency Medicine

## 2013-11-12 VITALS — BP 124/86 | HR 54 | Temp 98.2°F | Resp 16 | Ht 64.5 in | Wt 178.0 lb

## 2013-11-12 DIAGNOSIS — F32A Depression, unspecified: Secondary | ICD-10-CM

## 2013-11-12 DIAGNOSIS — F329 Major depressive disorder, single episode, unspecified: Secondary | ICD-10-CM

## 2013-11-12 DIAGNOSIS — F3289 Other specified depressive episodes: Secondary | ICD-10-CM

## 2013-11-12 DIAGNOSIS — F411 Generalized anxiety disorder: Secondary | ICD-10-CM

## 2013-11-12 NOTE — Progress Notes (Signed)
   Subjective:    Patient ID: Helen Cabrera, female    DOB: [birth year], 58 y.o.   MRN: [id number]  HPI Comments: 58 yo WF with history of anxiety/ depression/ dementia. Her husband has been trying to decrease clonazepam to see if it will help with her alertness. He has been her primary care giver for years with the dementia/ depression/ anxiety. He always manages medicine. Daughter was concerned because he was decreasing clonazepam and said mother was acting like she was going thru withdrawals.   Patient notes she is sleeping good but goes to bed at varying times. She notes she really doesn't do anything all day around the house or go anywhere. She notes she does feel a little more alert with dose change.    Past Medical History  Diagnosis Date  . Diverticulosis of colon (without mention of hemorrhage)   . IBS (irritable bowel syndrome)   . Unspecified gastritis and gastroduodenitis without mention of hemorrhage   . Family history of malignant neoplasm of gastrointestinal tract   . Hypertension   . Hypercholesterolemia   . Depression   . Chronic headaches   . Vitamin B12 deficiency   . Anemia     Iron def   . Bacterial overgrowth syndrome   . Pancreatitis   . GERD (gastroesophageal reflux disease)   . Osteoarthritis      Review of Systems  Psychiatric/Behavioral: Positive for confusion and sleep disturbance. The patient is nervous/anxious.   All other systems reviewed and are negative.  BP 124/86  Pulse 54  Temp(Src) 98.2 F (36.8 C) (Temporal)  Resp 16  Ht 5' 4.5" (1.638 m)  Wt 178 lb (80.74 kg)  BMI 30.09 kg/m2     Objective:   Physical Exam  Nursing note and vitals reviewed. Constitutional: She is oriented to person, place, and time. She appears well-developed and well-nourished.  HENT:  Head: Normocephalic and atraumatic.  Eyes: Conjunctivae are normal.  Neck: Normal range of motion.  Cardiovascular: Normal rate, regular rhythm, normal heart sounds and intact  distal pulses.   Pulmonary/Chest: Effort normal and breath sounds normal.  Abdominal: Soft. Bowel sounds are normal. She exhibits no distension. There is no tenderness.  Musculoskeletal: Normal range of motion.  Neurological: She is alert and oriented to person, place, and time.  Skin: Skin is warm and dry.  Psychiatric: She has a normal mood and affect.  She seems more alert today than past visits and receptive to the new SCHEDULE concept. She was tearful when family argues, but appropriate          Assessment & Plan:  Anxiety/ Dementia/ Insomnia- Continue 1/2 Clonazepam AD, Start schedule of sleep/ activities to try to improve mental alertness and decrease mental decline. Sleep hygiene explained. Hope to decrease more medication with increased activity levels. SX Viibryd 3 starter packs given use AD  Long visit with heated discussion between Husband and patients daughter.

## 2013-11-12 NOTE — Patient Instructions (Signed)

## 2014-01-12 ENCOUNTER — Ambulatory Visit (INDEPENDENT_AMBULATORY_CARE_PROVIDER_SITE_OTHER): Payer: 59 | Admitting: Emergency Medicine

## 2014-01-12 ENCOUNTER — Encounter: Payer: Self-pay | Admitting: Emergency Medicine

## 2014-01-12 VITALS — BP 124/78 | HR 66 | Temp 98.4°F | Resp 16 | Ht 63.75 in | Wt 181.0 lb

## 2014-01-12 DIAGNOSIS — R7309 Other abnormal glucose: Secondary | ICD-10-CM

## 2014-01-12 DIAGNOSIS — E782 Mixed hyperlipidemia: Secondary | ICD-10-CM

## 2014-01-12 DIAGNOSIS — I10 Essential (primary) hypertension: Secondary | ICD-10-CM

## 2014-01-12 LAB — CBC WITH DIFFERENTIAL/PLATELET
BASOS ABS: 0.1 10*3/uL (ref 0.0–0.1)
Basophils Relative: 1 % (ref 0–1)
EOS ABS: 0.4 10*3/uL (ref 0.0–0.7)
EOS PCT: 4 % (ref 0–5)
HCT: 37.4 % (ref 36.0–46.0)
Hemoglobin: 12.6 g/dL (ref 12.0–15.0)
Lymphocytes Relative: 24 % (ref 12–46)
Lymphs Abs: 2.1 10*3/uL (ref 0.7–4.0)
MCH: 28.6 pg (ref 26.0–34.0)
MCHC: 33.7 g/dL (ref 30.0–36.0)
MCV: 85 fL (ref 78.0–100.0)
Monocytes Absolute: 0.6 10*3/uL (ref 0.1–1.0)
Monocytes Relative: 7 % (ref 3–12)
Neutro Abs: 5.7 10*3/uL (ref 1.7–7.7)
Neutrophils Relative %: 64 % (ref 43–77)
PLATELETS: 273 10*3/uL (ref 150–400)
RBC: 4.4 MIL/uL (ref 3.87–5.11)
RDW: 13.8 % (ref 11.5–15.5)
WBC: 8.9 10*3/uL (ref 4.0–10.5)

## 2014-01-12 LAB — HEPATIC FUNCTION PANEL
ALT: 11 U/L (ref 0–35)
AST: 14 U/L (ref 0–37)
Albumin: 3.9 g/dL (ref 3.5–5.2)
Alkaline Phosphatase: 96 U/L (ref 39–117)
BILIRUBIN DIRECT: 0.1 mg/dL (ref 0.0–0.3)
BILIRUBIN TOTAL: 0.4 mg/dL (ref 0.2–1.2)
Indirect Bilirubin: 0.3 mg/dL (ref 0.2–1.2)
Total Protein: 6.5 g/dL (ref 6.0–8.3)

## 2014-01-12 LAB — BASIC METABOLIC PANEL WITH GFR
BUN: 24 mg/dL — AB (ref 6–23)
CO2: 29 mEq/L (ref 19–32)
CREATININE: 1.12 mg/dL — AB (ref 0.50–1.10)
Calcium: 9.5 mg/dL (ref 8.4–10.5)
Chloride: 105 mEq/L (ref 96–112)
GFR, EST AFRICAN AMERICAN: 63 mL/min
GFR, EST NON AFRICAN AMERICAN: 54 mL/min — AB
Glucose, Bld: 88 mg/dL (ref 70–99)
POTASSIUM: 4.3 meq/L (ref 3.5–5.3)
Sodium: 142 mEq/L (ref 135–145)

## 2014-01-12 LAB — LIPID PANEL
CHOL/HDL RATIO: 2.4 ratio
CHOLESTEROL: 140 mg/dL (ref 0–200)
HDL: 59 mg/dL (ref >39)
LDL Cholesterol: 50 mg/dL (ref 0–99)
TRIGLYCERIDES: 156 mg/dL — AB (ref <150)
VLDL: 31 mg/dL (ref 0–40)

## 2014-01-12 LAB — HEMOGLOBIN A1C
Hgb A1c MFr Bld: 6.1 % — ABNORMAL HIGH (ref <5.7)
Mean Plasma Glucose: 128 mg/dL — ABNORMAL HIGH (ref <117)

## 2014-01-12 MED ORDER — CLONAZEPAM 0.5 MG PO TABS: 0.5000 mg | ORAL_TABLET | Freq: Two times a day (BID) | ORAL | Status: DC | PRN

## 2014-01-12 MED ORDER — DESOXIMETASONE 0.25 % EX CREA: 1.0000 "application " | TOPICAL_CREAM | Freq: Two times a day (BID) | CUTANEOUS | Status: DC

## 2014-01-12 NOTE — Patient Instructions (Signed)
Contact Dermatitis Zyrtec in evening and cream prescription twice daily Contact dermatitis is a reaction to certain substances that touch the skin. Contact dermatitis can be either irritant contact dermatitis or allergic contact dermatitis. Irritant contact dermatitis does not require previous exposure to the substance for a reaction to occur.Allergic contact dermatitis only occurs if you have been exposed to the substance before. Upon a repeat exposure, your body reacts to the substance.  CAUSES  Many substances can cause contact dermatitis. Irritant dermatitis is most commonly caused by repeated exposure to mildly irritating substances, such as:  Makeup.  Soaps.  Detergents.  Bleaches.  Acids.  Metal salts, such as nickel. Allergic contact dermatitis is most commonly caused by exposure to:  Poisonous plants.  Chemicals (deodorants, shampoos).  Jewelry.  Latex.  Neomycin in triple antibiotic cream.  Preservatives in products, including clothing. SYMPTOMS  The area of skin that is exposed may develop:  Dryness or flaking.  Redness.  Cracks.  Itching.  Pain or a burning sensation.  Blisters. With allergic contact dermatitis, there may also be swelling in areas such as the eyelids, mouth, or genitals.  DIAGNOSIS  Your caregiver can usually tell what the problem is by doing a physical exam. In cases where the cause is uncertain and an allergic contact dermatitis is suspected, a patch skin test may be performed to help determine the cause of your dermatitis. TREATMENT Treatment includes protecting the skin from further contact with the irritating substance by avoiding that substance if possible. Barrier creams, powders, and gloves may be helpful. Your caregiver may also recommend:  Steroid creams or ointments applied 2 times daily. For best results, soak the rash area in cool water for 20 minutes. Then apply the medicine. Cover the area with a plastic wrap. You can  store the steroid cream in the refrigerator for a "chilly" effect on your rash. That may decrease itching. Oral steroid medicines may be needed in more severe cases.  Antibiotics or antibacterial ointments if a skin infection is present.  Antihistamine lotion or an antihistamine taken by mouth to ease itching.  Lubricants to keep moisture in your skin.  Burow's solution to reduce redness and soreness or to dry a weeping rash. Mix one packet or tablet of solution in 2 cups cool water. Dip a clean washcloth in the mixture, wring it out a bit, and put it on the affected area. Leave the cloth in place for 30 minutes. Do this as often as possible throughout the day.  Taking several cornstarch or baking soda baths daily if the area is too large to cover with a washcloth. Harsh chemicals, such as alkalis or acids, can cause skin damage that is like a burn. You should flush your skin for 15 to 20 minutes with cold water after such an exposure. You should also seek immediate medical care after exposure. Bandages (dressings), antibiotics, and pain medicine may be needed for severely irritated skin.  HOME CARE INSTRUCTIONS  Avoid the substance that caused your reaction.  Keep the area of skin that is affected away from hot water, soap, sunlight, chemicals, acidic substances, or anything else that would irritate your skin.  Do not scratch the rash. Scratching may cause the rash to become infected.  You may take cool baths to help stop the itching.  Only take over-the-counter or prescription medicines as directed by your caregiver.  See your caregiver for follow-up care as directed to make sure your skin is healing properly. SEEK MEDICAL CARE IF:  Your condition is not better after 3 days of treatment.  You seem to be getting worse.  You see signs of infection such as swelling, tenderness, redness, soreness, or warmth in the affected area.  You have any problems related to your  medicines. Document Released: 07/07/2000 Document Revised: 10/02/2011 Document Reviewed: 12/13/2010 [facility] Patient Information 2015 [place], [place]. This information is not intended to replace advice given to you by your health care Ravinder Hofland. Make sure you discuss any questions you have with your health care Adarrius Graeff.

## 2014-01-12 NOTE — Progress Notes (Signed)
Subjective:    Patient ID: Helen Cabrera, female    DOB: [birth year], 58 y.o.   MRN: [id number]  HPI Comments: 58 yo WF presents for 3 month F/U for HTN, Cholesterol, Pre-Dm, D. Deficient. She is trying to increase activity and improve diet. She has not started schedule AD at last OV but is overall feeling better. Her husband has continued to monitor success with decreasing Klonopin dose with overall alertness and mood. She is still sleeping well without higher dose and denies any anxiety.  She has noted rash on/ off x several weeks on left arm and neck. She denies any new exposures or OTC trials.  WBC             5.8   10/09/2013 HGB            12.2   10/09/2013 HCT            36.5   10/09/2013 PLT             253   10/09/2013 GLUCOSE          97   10/09/2013 CHOL            134   10/09/2013 TRIG             93   10/09/2013 HDL              54   10/09/2013 LDLCALC          61   10/09/2013 ALT              10   10/09/2013 AST              14   10/09/2013 NA              140   10/09/2013 K               3.9   10/09/2013 CL              103   10/09/2013 CREATININE     0.94   10/09/2013 BUN              24   10/09/2013 CO2              29   10/09/2013 TSH           0.702   10/09/2013 INR            0.92   08/25/2011 HGBA1C          5.6   10/09/2013   Hypertension  Hyperlipidemia     Medication List       This list is accurate as of: 01/12/14  3:36 PM.  Always use your most recent med list.               atenolol 100 MG tablet  Commonly known as:  TENORMIN  Take 100 mg by mouth daily.     atorvastatin 40 MG tablet  Commonly known as:  LIPITOR  Take 1 tablet (40 mg total) by mouth daily.     clonazePAM 1 MG tablet  Commonly known as:  KLONOPIN  qd     cyanocobalamin 1000 MCG/ML injection  Commonly known as:  (VITAMIN B-12)  INJECT 1ML EVERY 2 WEEKS INTRAMUSCULARLY     donepezil 10 MG tablet  Commonly known as:  ARICEPT  Take 10 mg by mouth at bedtime.     VIIBRYD 40 MG Tabs  Generic drug:  Vilazodone HCl  Take 40 mg by mouth daily.     Vitamin D3 50000 UNITS Caps  Take 50,000 Units by mouth once a week.       Allergies  Allergen Reactions  . Amitriptyline Other (See Comments)    dysphoria  . Carafate [Sucralfate] Other (See Comments)    constipation  . Lexapro [Escitalopram Oxalate]   . Paxil [Paroxetine Hcl]   . Prednisone   . Promethazine Hcl Nausea Only  . Zoloft [Sertraline Hcl]    Past Medical History  Diagnosis Date  . Diverticulosis of colon (without mention of hemorrhage)   . IBS (irritable bowel syndrome)   . Unspecified gastritis and gastroduodenitis without mention of hemorrhage   . Family history of malignant neoplasm of gastrointestinal tract   . Hypertension   . Hypercholesterolemia   . Depression   . Chronic headaches   . Vitamin B12 deficiency   . Anemia     Iron def   . Bacterial overgrowth syndrome   . Pancreatitis   . GERD (gastroesophageal reflux disease)   . Osteoarthritis       Review of Systems  Skin: Positive for rash.  All other systems reviewed and are negative.  BP 124/78  Pulse 66  Temp(Src) 98.4 F (36.9 C) (Temporal)  Resp 16  Ht 5' 3.75" (1.619 m)  Wt 181 lb (82.101 kg)  BMI 31.32 kg/m2     Objective:   Physical Exam  Nursing note and vitals reviewed. Constitutional: She is oriented to person, place, and time. She appears well-developed and well-nourished. No distress.  HENT:  Head: Normocephalic and atraumatic.  Right Ear: External ear normal.  Left Ear: External ear normal.  Nose: Nose normal.  Mouth/Throat: Oropharynx is clear and moist.  Eyes: Conjunctivae and EOM are normal.  Neck: Normal range of motion. Neck supple. No JVD present. No thyromegaly present.  Cardiovascular: Normal rate, regular rhythm, normal heart sounds and intact distal pulses.   Pulmonary/Chest: Effort normal and breath sounds normal.  Abdominal: Soft. Bowel sounds are normal. She exhibits no distension. There  is no tenderness.  Musculoskeletal: Normal range of motion. She exhibits no edema and no tenderness.  Lymphadenopathy:    She has no cervical adenopathy.  Neurological: She is alert and oriented to person, place, and time. No cranial nerve deficit.  Skin: Skin is warm and dry. Rash noted. No erythema. No pallor.     Psychiatric: She has a normal mood and affect. Her behavior is normal. Judgment and thought content normal.  + improvement with overall interactions and alertness at visit          Assessment & Plan:  1.  3 month F/U for HTN, Cholesterol, Pre-Dm, D. Deficient. Needs healthy diet, cardio QD and obtain healthy weight. Check Labs, Check BP if >130/80 call office   2. Memory change improved with decreased medications for sleep and adding Viibryd- Decrease klonopin to .5mg  QHS to 1/2 = 0.25 mg, rarely BID VIIbryd starter pak SX #6 paks, AD  3. RASH- Add Zyrtec OTC QHS, Topicort cream AD.

## 2014-02-15 ENCOUNTER — Encounter: Payer: Self-pay | Admitting: Internal Medicine

## 2014-03-15 ENCOUNTER — Emergency Department (HOSPITAL_COMMUNITY): Payer: 59

## 2014-03-15 ENCOUNTER — Encounter (HOSPITAL_COMMUNITY): Payer: Self-pay | Admitting: Emergency Medicine

## 2014-03-15 ENCOUNTER — Emergency Department (HOSPITAL_COMMUNITY)
Admission: EM | Admit: 2014-03-15 | Discharge: 2014-03-16 | Disposition: A | Discharge status: Home / Self Care | Payer: 59 | Attending: Emergency Medicine | Admitting: Emergency Medicine

## 2014-03-15 DIAGNOSIS — I10 Essential (primary) hypertension: Secondary | ICD-10-CM | POA: Insufficient documentation

## 2014-03-15 DIAGNOSIS — S46909A Unspecified injury of unspecified muscle, fascia and tendon at shoulder and upper arm level, unspecified arm, initial encounter: Secondary | ICD-10-CM | POA: Insufficient documentation

## 2014-03-15 DIAGNOSIS — Z862 Personal history of diseases of the blood and blood-forming organs and certain disorders involving the immune mechanism: Secondary | ICD-10-CM | POA: Insufficient documentation

## 2014-03-15 DIAGNOSIS — IMO0002 Reserved for concepts with insufficient information to code with codable children: Secondary | ICD-10-CM | POA: Insufficient documentation

## 2014-03-15 DIAGNOSIS — Y92410 Unspecified street and highway as the place of occurrence of the external cause: Secondary | ICD-10-CM | POA: Diagnosis not present

## 2014-03-15 DIAGNOSIS — F0390 Unspecified dementia without behavioral disturbance: Secondary | ICD-10-CM | POA: Insufficient documentation

## 2014-03-15 DIAGNOSIS — E7800 Pure hypercholesterolemia, unspecified: Secondary | ICD-10-CM | POA: Insufficient documentation

## 2014-03-15 DIAGNOSIS — Y9389 Activity, other specified: Secondary | ICD-10-CM | POA: Insufficient documentation

## 2014-03-15 DIAGNOSIS — M1990 Unspecified osteoarthritis, unspecified site: Secondary | ICD-10-CM | POA: Diagnosis not present

## 2014-03-15 DIAGNOSIS — Z79899 Other long term (current) drug therapy: Secondary | ICD-10-CM | POA: Insufficient documentation

## 2014-03-15 DIAGNOSIS — S0081XA Abrasion of other part of head, initial encounter: Secondary | ICD-10-CM

## 2014-03-15 DIAGNOSIS — S058X9A Other injuries of unspecified eye and orbit, initial encounter: Secondary | ICD-10-CM | POA: Insufficient documentation

## 2014-03-15 DIAGNOSIS — S0993XA Unspecified injury of face, initial encounter: Secondary | ICD-10-CM | POA: Insufficient documentation

## 2014-03-15 DIAGNOSIS — E538 Deficiency of other specified B group vitamins: Secondary | ICD-10-CM | POA: Diagnosis not present

## 2014-03-15 DIAGNOSIS — Z8719 Personal history of other diseases of the digestive system: Secondary | ICD-10-CM | POA: Insufficient documentation

## 2014-03-15 DIAGNOSIS — S4980XA Other specified injuries of shoulder and upper arm, unspecified arm, initial encounter: Secondary | ICD-10-CM | POA: Insufficient documentation

## 2014-03-15 DIAGNOSIS — H44811 Hemophthalmos, right eye: Secondary | ICD-10-CM

## 2014-03-15 DIAGNOSIS — F329 Major depressive disorder, single episode, unspecified: Secondary | ICD-10-CM | POA: Diagnosis not present

## 2014-03-15 DIAGNOSIS — S0181XA Laceration without foreign body of other part of head, initial encounter: Secondary | ICD-10-CM

## 2014-03-15 DIAGNOSIS — F3289 Other specified depressive episodes: Secondary | ICD-10-CM | POA: Insufficient documentation

## 2014-03-15 DIAGNOSIS — S199XXA Unspecified injury of neck, initial encounter: Secondary | ICD-10-CM

## 2014-03-15 HISTORY — DX: Unspecified dementia, unspecified severity, without behavioral disturbance, psychotic disturbance, mood disturbance, and anxiety: F03.90

## 2014-03-15 MED ORDER — ONDANSETRON HCL 4 MG/2ML IJ SOLN
4.0000 mg | Freq: Once | INTRAMUSCULAR | Status: AC
  Administered 2014-03-15: 4 mg via INTRAVENOUS
  Filled 2014-03-15: qty 2

## 2014-03-15 MED ORDER — MORPHINE SULFATE 2 MG/ML IJ SOLN
2.0000 mg | Freq: Once | INTRAMUSCULAR | Status: AC
  Administered 2014-03-15: 2 mg via INTRAVENOUS
  Filled 2014-03-15: qty 1

## 2014-03-15 MED ORDER — MORPHINE SULFATE 4 MG/ML IJ SOLN
4.0000 mg | Freq: Once | INTRAMUSCULAR | Status: AC
  Administered 2014-03-15: 4 mg via INTRAVENOUS
  Filled 2014-03-15: qty 1
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
## 2014-03-15 NOTE — ED Notes (Signed)
Patient transported to X-ray 

## 2014-03-15 NOTE — ED Notes (Signed)
Pts eyes flushed with sterile water.

## 2014-03-16 DIAGNOSIS — IMO0002 Reserved for concepts with insufficient information to code with codable children: Secondary | ICD-10-CM | POA: Diagnosis not present

## 2014-03-16 MED ORDER — ONDANSETRON HCL 4 MG/2ML IJ SOLN
4.0000 mg | Freq: Once | INTRAMUSCULAR | Status: AC
  Administered 2014-03-16: 4 mg via INTRAVENOUS
  Filled 2014-03-16: qty 2

## 2014-03-16 MED ORDER — PREDNISOLONE ACETATE 1 % OP SUSP
2.0000 [drp] | OPHTHALMIC | Status: DC
  Administered 2014-03-16: 2 [drp] via OPHTHALMIC
  Filled 2014-03-16: qty 1

## 2014-03-16 MED ORDER — CIPROFLOXACIN HCL 0.3 % OP SOLN
2.0000 [drp] | OPHTHALMIC | Status: DC
  Administered 2014-03-16: 2 [drp] via OPHTHALMIC
  Filled 2014-03-16: qty 2.5

## 2014-03-16 MED ORDER — HYDROCODONE-ACETAMINOPHEN 5-325 MG PO TABS: 1.0000 | ORAL_TABLET | ORAL | Status: DC | PRN
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
## 2014-03-19 ENCOUNTER — Other Ambulatory Visit: Payer: Self-pay | Admitting: Emergency Medicine

## 2014-03-26 ENCOUNTER — Ambulatory Visit (INDEPENDENT_AMBULATORY_CARE_PROVIDER_SITE_OTHER): Payer: 59 | Admitting: Physician Assistant

## 2014-03-26 ENCOUNTER — Encounter: Payer: Self-pay | Admitting: Physician Assistant

## 2014-03-26 VITALS — BP 120/70 | HR 56 | Temp 97.7°F | Resp 16 | Ht 64.0 in | Wt 179.0 lb

## 2014-03-26 DIAGNOSIS — Z79899 Other long term (current) drug therapy: Secondary | ICD-10-CM

## 2014-03-26 DIAGNOSIS — R51 Headache: Secondary | ICD-10-CM

## 2014-03-26 DIAGNOSIS — R443 Hallucinations, unspecified: Secondary | ICD-10-CM

## 2014-03-26 DIAGNOSIS — F05 Delirium due to known physiological condition: Secondary | ICD-10-CM

## 2014-03-26 NOTE — Patient Instructions (Signed)
cut atenolol in half due to low heart rate  Concussion A concussion, or closed-head injury, is a brain injury caused by a direct blow to the head or by a quick and sudden movement (jolt) of the head or neck. Concussions are usually not life-threatening. Even so, the effects of a concussion can be serious. If you have had a concussion before, you are more likely to experience concussion-like symptoms after a direct blow to the head.  CAUSES  Direct blow to the head, such as from running into another player during a soccer game, being hit in a fight, or hitting your head on a hard surface.  A jolt of the head or neck that causes the brain to move back and forth inside the skull, such as in a car crash. SIGNS AND SYMPTOMS The signs of a concussion can be hard to notice. Early on, they may be missed by you, family members, and health care providers. You may look fine but act or feel differently. Symptoms are usually temporary, but they may last for days, weeks, or even longer. Some symptoms may appear right away while others may not show up for hours or days. Every head injury is different. Symptoms include:  Mild to moderate headaches that will not go away.  A feeling of pressure inside your head.  Having more trouble than usual:  Learning or remembering things you have heard.  Answering questions.  Paying attention or concentrating.  Organizing daily tasks.  Making decisions and solving problems.  Slowness in thinking, acting or reacting, speaking, or reading.  Getting lost or being easily confused.  Feeling tired all the time or lacking energy (fatigued).  Feeling drowsy.  Sleep disturbances.  Sleeping more than usual.  Sleeping less than usual.  Trouble falling asleep.  Trouble sleeping (insomnia).  Loss of balance or feeling lightheaded or dizzy.  Nausea or vomiting.  Numbness or tingling.  Increased sensitivity  to:  Sounds.  Lights.  Distractions.  Vision problems or eyes that tire easily.  Diminished sense of taste or smell.  Ringing in the ears.  Mood changes such as feeling sad or anxious.  Becoming easily irritated or angry for little or no reason.  Lack of motivation.  Seeing or hearing things other people do not see or hear (hallucinations). DIAGNOSIS Your health care provider can usually diagnose a concussion based on a description of your injury and symptoms. He or she will ask whether you passed out (lost consciousness) and whether you are having trouble remembering events that happened right before and during your injury. Your evaluation might include:  A brain scan to look for signs of injury to the brain. Even if the test shows no injury, you may still have a concussion.  Blood tests to be sure other problems are not present. TREATMENT  Concussions are usually treated in an emergency department, in urgent care, or at a clinic. You may need to stay in the hospital overnight for further treatment.  Tell your health care provider if you are taking any medicines, including prescription medicines, over-the-counter medicines, and natural remedies. Some medicines, such as blood thinners (anticoagulants) and aspirin, may increase the chance of complications. Also tell your health care provider whether you have had alcohol or are taking illegal drugs. This information may affect treatment.  Your health care provider will send you home with important instructions to follow.  How fast you will recover from a concussion depends on many factors. These factors include how severe your  concussion is, what part of your brain was injured, your age, and how healthy you were before the concussion.  Most people with mild injuries recover fully. Recovery can take time. In general, recovery is slower in older persons. Also, persons who have had a concussion in the past or have other medical  problems may find that it takes longer to recover from their current injury. HOME CARE INSTRUCTIONS General Instructions  Carefully follow the directions your health care provider gave you.  Only take over-the-counter or prescription medicines for pain, discomfort, or fever as directed by your health care provider.  Take only those medicines that your health care provider has approved.  Do not drink alcohol until your health care provider says you are well enough to do so. Alcohol and certain other drugs may slow your recovery and can put you at risk of further injury.  If it is harder than usual to remember things, write them down.  If you are easily distracted, try to do one thing at a time. For example, do not try to watch TV while fixing dinner.  Talk with family members or close friends when making important decisions.  Keep all follow-up appointments. Repeated evaluation of your symptoms is recommended for your recovery.  Watch your symptoms and tell others to do the same. Complications sometimes occur after a concussion. Older adults with a brain injury may have a higher risk of serious complications, such as a blood clot on the brain.  Tell your teachers, school nurse, school counselor, coach, athletic trainer, or work Freight forwarder about your injury, symptoms, and restrictions. Tell them about what you can or cannot do. They should watch for:  Increased problems with attention or concentration.  Increased difficulty remembering or learning new information.  Increased time needed to complete tasks or assignments.  Increased irritability or decreased ability to cope with stress.  Increased symptoms.  Rest. Rest helps the brain to heal. Make sure you:  Get plenty of sleep at night. Avoid staying up late at night.  Keep the same bedtime hours on weekends and weekdays.  Rest during the day. Take daytime naps or rest breaks when you feel tired.  Limit activities that require a  lot of thought or concentration. These include:  Doing homework or job-related work.  Watching TV.  Working on the computer.  Avoid any situation where there is potential for another head injury (football, hockey, soccer, basketball, martial arts, downhill snow sports and horseback riding). Your condition will get worse every time you experience a concussion. You should avoid these activities until you are evaluated by the appropriate follow-up health care providers. Returning To Your Regular Activities You will need to return to your normal activities slowly, not all at once. You must give your body and brain enough time for recovery.  Do not return to sports or other athletic activities until your health care provider tells you it is safe to do so.  Ask your health care provider when you can drive, ride a bicycle, or operate heavy machinery. Your ability to react may be slower after a brain injury. Never do these activities if you are dizzy.  Ask your health care provider about when you can return to work or school. Preventing Another Concussion It is very important to avoid another brain injury, especially before you have recovered. In rare cases, another injury can lead to permanent brain damage, brain swelling, or death. The risk of this is greatest during the first 7-10 days after  a head injury. Avoid injuries by:  Wearing a seat belt when riding in a car.  Drinking alcohol only in moderation.  Wearing a helmet when biking, skiing, skateboarding, skating, or doing similar activities.  Avoiding activities that could lead to a second concussion, such as contact or recreational sports, until your health care provider says it is okay.  Taking safety measures in your home.  Remove clutter and tripping hazards from floors and stairways.  Use grab bars in bathrooms and handrails by stairs.  Place non-slip mats on floors and in bathtubs.  Improve lighting in dim areas. SEEK MEDICAL  CARE IF:  You have increased problems paying attention or concentrating.  You have increased difficulty remembering or learning new information.  You need more time to complete tasks or assignments than before.  You have increased irritability or decreased ability to cope with stress.  You have more symptoms than before. Seek medical care if you have any of the following symptoms for more than 2 weeks after your injury:  Lasting (chronic) headaches.  Dizziness or balance problems.  Nausea.  Vision problems.  Increased sensitivity to noise or light.  Depression or mood swings.  Anxiety or irritability.  Memory problems.  Difficulty concentrating or paying attention.  Sleep problems.  Feeling tired all the time. SEEK IMMEDIATE MEDICAL CARE IF:  You have severe or worsening headaches. These may be a sign of a blood clot in the brain.  You have weakness (even if only in one hand, leg, or part of the face).  You have numbness.  You have decreased coordination.  You vomit repeatedly.  You have increased sleepiness.  One pupil is larger than the other.  You have convulsions.  You have slurred speech.  You have increased confusion. This may be a sign of a blood clot in the brain.  You have increased restlessness, agitation, or irritability.  You are unable to recognize people or places.  You have neck pain.  It is difficult to wake you up.  You have unusual behavior changes.  You lose consciousness. MAKE SURE YOU:  Understand these instructions.  Will watch your condition.  Will get help right away if you are not doing well or get worse. Document Released: 09/30/2003 Document Revised: 07/15/2013 Document Reviewed: 01/30/2013 [facility] Patient Information 2015 [place], [place]. This information is not intended to replace advice given to you by your health care provider. Make sure you discuss any questions you have with your health care provider.
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
## 2014-03-27 ENCOUNTER — Other Ambulatory Visit: Payer: Self-pay | Admitting: Ophthalmology

## 2014-03-27 LAB — CBC WITH DIFFERENTIAL/PLATELET
BASOS ABS: 0.1 10*3/uL (ref 0.0–0.1)
Basophils Relative: 1 % (ref 0–1)
Eosinophils Absolute: 0.2 10*3/uL (ref 0.0–0.7)
Eosinophils Relative: 3 % (ref 0–5)
HEMATOCRIT: 39.5 % (ref 36.0–46.0)
Hemoglobin: 13 g/dL (ref 12.0–15.0)
LYMPHS PCT: 25 % (ref 12–46)
Lymphs Abs: 1.8 10*3/uL (ref 0.7–4.0)
MCH: 28.5 pg (ref 26.0–34.0)
MCHC: 32.9 g/dL (ref 30.0–36.0)
MCV: 86.6 fL (ref 78.0–100.0)
MONO ABS: 0.5 10*3/uL (ref 0.1–1.0)
Monocytes Relative: 7 % (ref 3–12)
NEUTROS ABS: 4.5 10*3/uL (ref 1.7–7.7)
Neutrophils Relative %: 64 % (ref 43–77)
Platelets: 292 10*3/uL (ref 150–400)
RBC: 4.56 MIL/uL (ref 3.87–5.11)
RDW: 13.6 % (ref 11.5–15.5)
WBC: 7.1 10*3/uL (ref 4.0–10.5)

## 2014-03-27 LAB — BASIC METABOLIC PANEL WITH GFR
BUN: 22 mg/dL (ref 6–23)
CHLORIDE: 104 meq/L (ref 96–112)
CO2: 32 mEq/L (ref 19–32)
Calcium: 9.5 mg/dL (ref 8.4–10.5)
Creat: 0.99 mg/dL (ref 0.50–1.10)
GFR, EST NON AFRICAN AMERICAN: 63 mL/min
GFR, Est African American: 73 mL/min
Glucose, Bld: 95 mg/dL (ref 70–99)
POTASSIUM: 4.6 meq/L (ref 3.5–5.3)
SODIUM: 143 meq/L (ref 135–145)

## 2014-03-27 LAB — HEPATIC FUNCTION PANEL
ALT: 8 U/L (ref 0–35)
AST: 14 U/L (ref 0–37)
Albumin: 4 g/dL (ref 3.5–5.2)
Alkaline Phosphatase: 91 U/L (ref 39–117)
BILIRUBIN INDIRECT: 0.3 mg/dL (ref 0.2–1.2)
BILIRUBIN TOTAL: 0.4 mg/dL (ref 0.2–1.2)
Bilirubin, Direct: 0.1 mg/dL (ref 0.0–0.3)
Total Protein: 6.6 g/dL (ref 6.0–8.3)

## 2014-03-27 LAB — TSH: TSH: 0.502 u[IU]/mL (ref 0.350–4.500)

## 2014-03-31 ENCOUNTER — Encounter (HOSPITAL_COMMUNITY)
Admission: RE | Admit: 2014-03-31 | Discharge: 2014-03-31 | Disposition: A | Discharge status: Home / Self Care | Payer: 59 | Source: Ambulatory Visit | Attending: Family Medicine | Admitting: Family Medicine

## 2014-03-31 ENCOUNTER — Encounter (HOSPITAL_COMMUNITY): Payer: Self-pay

## 2014-03-31 DIAGNOSIS — F329 Major depressive disorder, single episode, unspecified: Secondary | ICD-10-CM | POA: Diagnosis not present

## 2014-03-31 DIAGNOSIS — F3289 Other specified depressive episodes: Secondary | ICD-10-CM | POA: Diagnosis not present

## 2014-03-31 DIAGNOSIS — S0510XA Contusion of eyeball and orbital tissues, unspecified eye, initial encounter: Secondary | ICD-10-CM | POA: Diagnosis not present

## 2014-03-31 DIAGNOSIS — F0390 Unspecified dementia without behavioral disturbance: Secondary | ICD-10-CM | POA: Diagnosis not present

## 2014-03-31 DIAGNOSIS — I10 Essential (primary) hypertension: Secondary | ICD-10-CM | POA: Diagnosis not present

## 2014-03-31 DIAGNOSIS — H33059 Total retinal detachment, unspecified eye: Secondary | ICD-10-CM | POA: Diagnosis not present

## 2014-03-31 HISTORY — DX: Headache: R51

## 2014-03-31 LAB — BASIC METABOLIC PANEL
ANION GAP: 11 (ref 5–15)
BUN: 16 mg/dL (ref 6–23)
CO2: 29 meq/L (ref 19–32)
CREATININE: 1.05 mg/dL (ref 0.50–1.10)
Calcium: 8.8 mg/dL (ref 8.4–10.5)
Chloride: 101 mEq/L (ref 96–112)
GFR calc Af Amer: 66 mL/min — ABNORMAL LOW (ref >90)
GFR calc non Af Amer: 57 mL/min — ABNORMAL LOW (ref >90)
Glucose, Bld: 99 mg/dL (ref 70–99)
Potassium: 3.4 mEq/L — ABNORMAL LOW (ref 3.7–5.3)
Sodium: 141 mEq/L (ref 137–147)

## 2014-03-31 LAB — CBC
HEMATOCRIT: 40 % (ref 36.0–46.0)
Hemoglobin: 13 g/dL (ref 12.0–15.0)
MCH: 28.4 pg (ref 26.0–34.0)
MCHC: 32.5 g/dL (ref 30.0–36.0)
MCV: 87.5 fL (ref 78.0–100.0)
Platelets: 285 10*3/uL (ref 150–400)
RBC: 4.57 MIL/uL (ref 3.87–5.11)
RDW: 13.1 % (ref 11.5–15.5)
WBC: 7.4 10*3/uL (ref 4.0–10.5)

## 2014-03-31 NOTE — Pre-Procedure Instructions (Signed)
Helen Cabrera  03/31/2014   Your procedure is scheduled on:  04/02/14  Report to [facility] Admitting at 1130 AM.  Call this number if you have problems the morning of surgery: [phone]   Remember:   Do not eat food or drink liquids after midnight.   Take these medicines the morning of surgery with A SIP OF WATER: atenolol,aricept,vibryd,hydrocone   Do not wear jewelry, make-up or nail polish.  Do not wear lotions, powders, or perfumes. You may wear deodorant.  Do not shave 48 hours prior to surgery. Men may shave face and neck.  Do not bring valuables to the hospital.  [facility] is not responsible                  for any belongings or valuables.               Contacts, dentures or bridgework may not be worn into surgery.  Leave suitcase in the car. After surgery it may be brought to your room.  For patients admitted to the hospital, discharge time is determined by your                treatment team.               Patients discharged the day of surgery will not be allowed to drive  home.  Name and phone number of your driver:  Special Instructions: Shower using CHG 2 nights before surgery and the night before surgery.  If you shower the day of surgery use CHG.  Use special wash - you have one bottle of CHG for all showers.  You should use approximately 1/3 of the bottle for each shower.   Please read over the following fact sheets that you were given: Pain Booklet, Coughing and Deep Breathing and Surgical Site Infection Prevention

## 2014-04-01 MED ORDER — PHENYLEPHRINE HCL 2.5 % OP SOLN
1.0000 [drp] | OPHTHALMIC | Status: AC | PRN
  Administered 2014-04-02 (×3): 1 [drp] via OPHTHALMIC
  Filled 2014-04-01: qty 2

## 2014-04-01 MED ORDER — CYCLOPENTOLATE HCL 1 % OP SOLN
1.0000 [drp] | OPHTHALMIC | Status: AC | PRN
  Administered 2014-04-02 (×3): 1 [drp] via OPHTHALMIC
  Filled 2014-04-01: qty 2

## 2014-04-01 MED ORDER — GATIFLOXACIN 0.5 % OP SOLN
1.0000 [drp] | OPHTHALMIC | Status: AC | PRN
  Administered 2014-04-02 (×3): 1 [drp] via OPHTHALMIC
  Filled 2014-04-01: qty 2.5

## 2014-04-01 NOTE — Progress Notes (Signed)
Anesthesia Chart Review:  Patient is a 58 year old female scheduled for right pars plana vitrectomy on 04/02/14 by Dr. Zadie Rhine.  History includes non-smoker, HTN, dementia, headaches, depression, hysterectomy, appendectomy. PCP is Dr. Melford Aase.  EKG on 03/31/14 showed SB. Slightly negative T wave in V3 (non-specific).  Preoperative CXR and labs noted.   I anticipate that she can proceed as planned.  George Hugh [facility]/Anesthesiology Phone [phone] 04/01/2014 12:27 PM

## 2014-04-02 ENCOUNTER — Ambulatory Visit (HOSPITAL_COMMUNITY)
Admission: RE | Admit: 2014-04-02 | Discharge: 2014-04-02 | Disposition: A | Discharge status: Home / Self Care | Payer: 59 | Source: Ambulatory Visit | Attending: Ophthalmology | Admitting: Ophthalmology

## 2014-04-02 ENCOUNTER — Encounter (HOSPITAL_COMMUNITY)
Admission: RE | Disposition: A | Discharge status: Home / Self Care | Payer: 59 | Source: Ambulatory Visit | Attending: Ophthalmology

## 2014-04-02 ENCOUNTER — Encounter (HOSPITAL_COMMUNITY): Payer: 59 | Admitting: Vascular Surgery

## 2014-04-02 ENCOUNTER — Other Ambulatory Visit: Payer: 59

## 2014-04-02 ENCOUNTER — Encounter (HOSPITAL_COMMUNITY): Payer: Self-pay | Admitting: *Deleted

## 2014-04-02 ENCOUNTER — Other Ambulatory Visit: Payer: Self-pay | Admitting: Ophthalmology

## 2014-04-02 ENCOUNTER — Ambulatory Visit (HOSPITAL_COMMUNITY): Payer: 59 | Admitting: Anesthesiology

## 2014-04-02 DIAGNOSIS — F329 Major depressive disorder, single episode, unspecified: Secondary | ICD-10-CM | POA: Insufficient documentation

## 2014-04-02 DIAGNOSIS — I10 Essential (primary) hypertension: Secondary | ICD-10-CM | POA: Insufficient documentation

## 2014-04-02 DIAGNOSIS — F0390 Unspecified dementia without behavioral disturbance: Secondary | ICD-10-CM | POA: Insufficient documentation

## 2014-04-02 DIAGNOSIS — H33041 Retinal detachment with retinal dialysis, right eye: Secondary | ICD-10-CM

## 2014-04-02 DIAGNOSIS — H33059 Total retinal detachment, unspecified eye: Secondary | ICD-10-CM | POA: Insufficient documentation

## 2014-04-02 DIAGNOSIS — S0510XA Contusion of eyeball and orbital tissues, unspecified eye, initial encounter: Secondary | ICD-10-CM | POA: Insufficient documentation

## 2014-04-02 DIAGNOSIS — F3289 Other specified depressive episodes: Secondary | ICD-10-CM | POA: Insufficient documentation

## 2014-04-02 HISTORY — PX: PARS PLANA VITRECTOMY: SHX2166

## 2014-04-02 SURGERY — PARS PLANA VITRECTOMY WITH 25 GAUGE
Anesthesia: General | Site: Eye | Laterality: Right

## 2014-04-02 MED ORDER — PROVISC 10 MG/ML IO SOLN
INTRAOCULAR | Status: DC | PRN
  Administered 2014-04-02: 0.85 mL via INTRAOCULAR

## 2014-04-02 MED ORDER — SODIUM CHLORIDE 0.9 % IV SOLN
INTRAVENOUS | Status: DC
  Administered 2014-04-02: 13:00:00 via INTRAVENOUS

## 2014-04-02 MED ORDER — BUPIVACAINE HCL (PF) 0.75 % IJ SOLN
INTRAMUSCULAR | Status: DC | PRN
  Administered 2014-04-02: 10 mL

## 2014-04-02 MED ORDER — FENTANYL CITRATE 0.05 MG/ML IJ SOLN
INTRAMUSCULAR | Status: DC | PRN
  Administered 2014-04-02: 100 ug via INTRAVENOUS
  Administered 2014-04-02 (×2): 50 ug via INTRAVENOUS

## 2014-04-02 MED ORDER — LIDOCAINE HCL (CARDIAC) 20 MG/ML IV SOLN
INTRAVENOUS | Status: DC | PRN
  Administered 2014-04-02: 140 mg via INTRAVENOUS

## 2014-04-02 MED ORDER — OXYCODONE HCL 5 MG/5ML PO SOLN: 5.0000 mg | Freq: Once | ORAL | Status: DC | PRN

## 2014-04-02 MED ORDER — GENTAMICIN SULFATE 40 MG/ML IJ SOLN
INTRAMUSCULAR | Status: AC
  Filled 2014-04-02: qty 2

## 2014-04-02 MED ORDER — MIDAZOLAM HCL 5 MG/5ML IJ SOLN
INTRAMUSCULAR | Status: DC | PRN
  Administered 2014-04-02: 2 mg via INTRAVENOUS

## 2014-04-02 MED ORDER — LIDOCAINE HCL 2 % IJ SOLN
INTRAMUSCULAR | Status: AC
Start: 2014-04-02 — End: 2014-04-02
  Filled 2014-04-02: qty 20

## 2014-04-02 MED ORDER — NA CHONDROIT SULF-NA HYALURON 40-30 MG/ML IO SOLN
INTRAOCULAR | Status: AC
  Filled 2014-04-02: qty 0.5

## 2014-04-02 MED ORDER — ONDANSETRON HCL 4 MG/2ML IJ SOLN
INTRAMUSCULAR | Status: DC | PRN
  Administered 2014-04-02: 4 mg via INTRAVENOUS

## 2014-04-02 MED ORDER — SUCCINYLCHOLINE CHLORIDE 20 MG/ML IJ SOLN
INTRAMUSCULAR | Status: DC | PRN
  Administered 2014-04-02: 100 mg via INTRAVENOUS

## 2014-04-02 MED ORDER — 0.9 % SODIUM CHLORIDE (POUR BTL) OPTIME
TOPICAL | Status: DC | PRN
  Administered 2014-04-02: 200 mL

## 2014-04-02 MED ORDER — STERILE WATER FOR INJECTION IJ SOLN
INTRAMUSCULAR | Status: DC | PRN
Start: 2014-04-02 — End: 2014-04-02
  Administered 2014-04-02: 200 mL

## 2014-04-02 MED ORDER — NA CHONDROIT SULF-NA HYALURON 40-30 MG/ML IO SOLN
INTRAOCULAR | Status: DC | PRN
  Administered 2014-04-02: 0.5 mL via INTRAOCULAR

## 2014-04-02 MED ORDER — ROCURONIUM BROMIDE 100 MG/10ML IV SOLN
INTRAVENOUS | Status: DC | PRN
  Administered 2014-04-02: 10 mg via INTRAVENOUS
  Administered 2014-04-02: 20 mg via INTRAVENOUS

## 2014-04-02 MED ORDER — DEXAMETHASONE SODIUM PHOSPHATE 10 MG/ML IJ SOLN
INTRAMUSCULAR | Status: DC | PRN
  Administered 2014-04-02: 10 mg via INTRAVENOUS

## 2014-04-02 MED ORDER — TETRACAINE HCL 0.5 % OP SOLN
OPHTHALMIC | Status: AC
  Filled 2014-04-02: qty 2

## 2014-04-02 MED ORDER — HYPROMELLOSE (GONIOSCOPIC) 2.5 % OP SOLN
OPHTHALMIC | Status: AC
  Filled 2014-04-02: qty 15

## 2014-04-02 MED ORDER — FENTANYL CITRATE 0.05 MG/ML IJ SOLN
INTRAMUSCULAR | Status: AC
  Filled 2014-04-02: qty 5

## 2014-04-02 MED ORDER — SODIUM HYALURONATE 10 MG/ML IO SOLN
INTRAOCULAR | Status: AC
  Filled 2014-04-02: qty 0.85

## 2014-04-02 MED ORDER — MIDAZOLAM HCL 2 MG/2ML IJ SOLN
INTRAMUSCULAR | Status: AC
  Filled 2014-04-02: qty 2

## 2014-04-02 MED ORDER — PROPOFOL 10 MG/ML IV BOLUS
INTRAVENOUS | Status: DC | PRN
  Administered 2014-04-02: 100 mg via INTRAVENOUS

## 2014-04-02 MED ORDER — PROPOFOL 10 MG/ML IV BOLUS
INTRAVENOUS | Status: AC
  Filled 2014-04-02: qty 20

## 2014-04-02 MED ORDER — GLYCOPYRROLATE 0.2 MG/ML IJ SOLN
INTRAMUSCULAR | Status: DC | PRN
  Administered 2014-04-02: 0.4 mg via INTRAVENOUS

## 2014-04-02 MED ORDER — POLYMYXIN B SULFATE 500000 UNITS IJ SOLR
INTRAMUSCULAR | Status: AC
  Filled 2014-04-02: qty 1

## 2014-04-02 MED ORDER — BSS IO SOLN
INTRAOCULAR | Status: DC | PRN
  Administered 2014-04-02: 15 mL via INTRAOCULAR

## 2014-04-02 MED ORDER — SODIUM CHLORIDE 0.9 % IV SOLN
INTRAVENOUS | Status: DC | PRN
  Administered 2014-04-02: 13:00:00 via INTRAVENOUS

## 2014-04-02 MED ORDER — EPINEPHRINE HCL 1 MG/ML IJ SOLN
INTRAMUSCULAR | Status: AC
  Filled 2014-04-02: qty 1

## 2014-04-02 MED ORDER — FENTANYL CITRATE 0.05 MG/ML IJ SOLN
INTRAMUSCULAR | Status: AC
  Filled 2014-04-02: qty 2

## 2014-04-02 MED ORDER — BUPIVACAINE HCL (PF) 0.75 % IJ SOLN
INTRAMUSCULAR | Status: AC
  Filled 2014-04-02: qty 10

## 2014-04-02 MED ORDER — FENTANYL CITRATE 0.05 MG/ML IJ SOLN
25.0000 ug | INTRAMUSCULAR | Status: DC | PRN
  Administered 2014-04-02 (×4): 25 ug via INTRAVENOUS

## 2014-04-02 MED ORDER — OXYCODONE HCL 5 MG PO TABS: 5.0000 mg | ORAL_TABLET | Freq: Once | ORAL | Status: DC | PRN

## 2014-04-02 MED ORDER — BSS PLUS IO SOLN
INTRAOCULAR | Status: AC
  Filled 2014-04-02: qty 500

## 2014-04-02 MED ORDER — SODIUM CHLORIDE 0.9 % IJ SOLN
INTRAMUSCULAR | Status: AC
  Filled 2014-04-02: qty 10

## 2014-04-02 MED ORDER — NEOSTIGMINE METHYLSULFATE 10 MG/10ML IV SOLN
INTRAVENOUS | Status: DC | PRN
  Administered 2014-04-02: 3 mg via INTRAVENOUS

## 2014-04-02 MED ORDER — DEXAMETHASONE SODIUM PHOSPHATE 10 MG/ML IJ SOLN
INTRAMUSCULAR | Status: AC
  Filled 2014-04-02: qty 1

## 2014-04-02 MED ORDER — EPINEPHRINE HCL 1 MG/ML IJ SOLN
INTRAOCULAR | Status: DC | PRN
  Administered 2014-04-02: 14:00:00

## 2014-04-02 MED ORDER — HYPROMELLOSE (GONIOSCOPIC) 2.5 % OP SOLN
OPHTHALMIC | Status: DC | PRN
  Administered 2014-04-02: 2 [drp] via OPHTHALMIC

## 2014-04-02 SURGICAL SUPPLY — 64 items
25G DUALBORE CANNULA ×2 IMPLANT
APPLICATOR COTTON TIP 6IN STRL (MISCELLANEOUS) ×2 IMPLANT
APPLICATOR DR MATTHEWS STRL (MISCELLANEOUS) IMPLANT
BLADE 10 SAFETY STRL DISP (BLADE) ×2 IMPLANT
BLADE MVR KNIFE 20G (BLADE) ×2 IMPLANT
CANNULA ANT CHAM MAIN (OPHTHALMIC RELATED) IMPLANT
CANNULA VLV SOFT TIP 25GA (OPHTHALMIC) ×2 IMPLANT
CORDS BIPOLAR (ELECTRODE) IMPLANT
COVER MAYO STAND STRL (DRAPES) ×2 IMPLANT
DRAPE INCISE 51X51 W/FILM STRL (DRAPES) IMPLANT
DRAPE OPHTHALMIC 77X100 STRL (CUSTOM PROCEDURE TRAY) ×2 IMPLANT
FILTER BLUE MILLIPORE (MISCELLANEOUS) IMPLANT
FORCEPS ECKARDT ILM 25G SERR (OPHTHALMIC RELATED) IMPLANT
FORCEPS GRIESHABER ILM 25G A (INSTRUMENTS) IMPLANT
FORCEPS HORIZONTAL 25G DISP (OPHTHALMIC RELATED) IMPLANT
FORCEPS ILM 25G DSP TIP (MISCELLANEOUS) IMPLANT
GAS AUTO FILL CONSTEL (OPHTHALMIC)
GAS AUTO FILL CONSTELLATION (OPHTHALMIC) IMPLANT
GLOVE BIO SURGEON STRL SZ8 (GLOVE) ×2 IMPLANT
GLOVE BIOGEL M STER SZ 6 (GLOVE) ×2 IMPLANT
GLOVE BIOGEL PI IND STRL 6.5 (GLOVE) ×1 IMPLANT
GLOVE BIOGEL PI INDICATOR 6.5 (GLOVE) ×1
GLOVE SS BIOGEL STRL SZ 8.5 (GLOVE) ×1 IMPLANT
GLOVE SUPERSENSE BIOGEL SZ 8.5 (GLOVE) ×1
GOWN STRL REUS W/ TWL LRG LVL3 (GOWN DISPOSABLE) ×2 IMPLANT
GOWN STRL REUS W/ TWL XL LVL3 (GOWN DISPOSABLE) ×1 IMPLANT
GOWN STRL REUS W/TWL LRG LVL3 (GOWN DISPOSABLE) ×2
GOWN STRL REUS W/TWL XL LVL3 (GOWN DISPOSABLE) ×1
HANDLE PNEUMATIC FOR CONSTEL (OPHTHALMIC) IMPLANT
KIT BASIN OR (CUSTOM PROCEDURE TRAY) ×2 IMPLANT
KIT PERFLUORON PROCEDURE 5ML (MISCELLANEOUS) ×2 IMPLANT
KIT ROOM TURNOVER OR (KITS) ×2 IMPLANT
KNIFE CRESCENT 2.5 55 ANG (BLADE) IMPLANT
LENS BIOM SUPER VIEW SET DISP (OPHTHALMIC RELATED) ×2 IMPLANT
MICROPICK 25G (MISCELLANEOUS)
NEEDLE 18GX1X1/2 (RX/OR ONLY) (NEEDLE) ×2 IMPLANT
NEEDLE 25GX 5/8IN NON SAFETY (NEEDLE) ×2 IMPLANT
NEEDLE FILTER BLUNT 18X 1/2SAF (NEEDLE) ×1
NEEDLE FILTER BLUNT 18X1 1/2 (NEEDLE) ×1 IMPLANT
NEEDLE HYPO 25GX1X1/2 BEV (NEEDLE) IMPLANT
NS IRRIG 1000ML POUR BTL (IV SOLUTION) ×2 IMPLANT
OIL SILICONE OPHTHALMIC ADAPTO (Ophthalmic Related) ×2 IMPLANT
PACK FRAGMATOME (OPHTHALMIC) ×2 IMPLANT
PACK VITRECTOMY CUSTOM (CUSTOM PROCEDURE TRAY) ×2 IMPLANT
PAD ARMBOARD 7.5X6 YLW CONV (MISCELLANEOUS) ×2 IMPLANT
PAK PIK VITRECTOMY CVS 25GA (OPHTHALMIC) ×2 IMPLANT
PENCIL BIPOLAR 25GA STR DISP (OPHTHALMIC RELATED) IMPLANT
PICK MICROPICK 25G (MISCELLANEOUS) IMPLANT
PROBE LASER ILLUM FLEX CVD 25G (OPHTHALMIC) ×2 IMPLANT
ROLLS DENTAL (MISCELLANEOUS) IMPLANT
SCRAPER DIAMOND 25GA (OPHTHALMIC RELATED) IMPLANT
SET INJECTOR OIL FLUID CONSTEL (OPHTHALMIC) ×2 IMPLANT
STOCKINETTE IMPERVIOUS 9X36 MD (GAUZE/BANDAGES/DRESSINGS) ×4 IMPLANT
STOPCOCK 4 WAY LG BORE MALE ST (IV SETS) IMPLANT
SUT ETHILON 10 0 CS140 6 (SUTURE) IMPLANT
SUT ETHILON 8 0 BV130 4 (SUTURE) IMPLANT
SUT MERSILENE 5 0 RD 1 DA (SUTURE) IMPLANT
SUT PROLENE 10 0 CIF 4 DA (SUTURE) IMPLANT
SYR 30ML SLIP (SYRINGE) IMPLANT
SYR 5ML LL (SYRINGE) IMPLANT
SYR TB 1ML LUER SLIP (SYRINGE) ×2 IMPLANT
TOWEL OR 17X26 10 PK STRL BLUE (TOWEL DISPOSABLE) ×2 IMPLANT
WATER STERILE IRR 1000ML POUR (IV SOLUTION) ×2 IMPLANT
WIPE INSTRUMENT VISIWIPE 73X73 (MISCELLANEOUS) ×2 IMPLANT
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
## 2014-04-02 NOTE — Transfer of Care (Signed)
Immediate Anesthesia Transfer of Care Note  Patient: Helen Cabrera  Procedure(s) Performed: Procedure(s): RIGHT PARS PLANA VITRECTOMY WITH 25 GAUGE/ENDO LASER/LENSECTOMY/INSERTION OF SILICONE OIL,REPAIR COMPLEX RETINAL DETACHMENT,MEMBRANE PEEL, IRIDECTOMY (Right)  Patient Location: PACU  Anesthesia Type:General  Level of Consciousness: awake, sedated and patient cooperative  Airway & Oxygen Therapy: Patient Spontanous Breathing and Patient connected to nasal cannula oxygen  Post-op Assessment: Report given to PACU RN, Post -op Vital signs reviewed and stable and Patient moving all extremities  Post vital signs: Reviewed and stable  Complications: No apparent anesthesia complications

## 2014-04-02 NOTE — Brief Op Note (Signed)
04/02/2014  4:08 PM  PATIENT:  Helen Cabrera  58 y.o. female  PRE-OPERATIVE DIAGNOSIS:  RETINAL DETACHMENT  POST-OPERATIVE DIAGNOSIS:  RETINAL DETACHMENT  PROCEDURE:  Procedure(s): RIGHT PARS PLANA VITRECTOMY WITH 25 GAUGE/ENDO LASER/LENSECTOMY/INSERTION OF SILICONE OIL,REPAIR COMPLEX RETINAL DETACHMENT,MEMBRANE PEEL, IRIDECTOMY (Right)  SURGEON:  Surgeon(s) and Role:    * Hurman Horn, MD - Primary  PHYSICIAN ASSISTANT:   ASSISTANTS: none   ANESTHESIA:   general  EBL:     BLOOD ADMINISTERED:none  DRAINS: none   LOCAL MEDICATIONS USED:  MARCAINE   .75%  5 cc  SPECIMEN:  No Specimen  DISPOSITION OF SPECIMEN:  N/A  COUNTS:  NO , one needle, from 7 0 vicryl, that i witnessed exit the surgical field, and removed to the [place] table,,  new tech  [place], and mindy present.  TOURNIQUET:  * No tourniquets in log *  DICTATION: .Other Dictation: Dictation Number number not recorded  PLAN OF CARE: Discharge to home after PACU  PATIENT DISPOSITION:  PACU - hemodynamically stable.   Delay start of Pharmacological VTE agent (>24hrs) due to surgical blood loss or risk of bleeding: yes

## 2014-04-02 NOTE — Anesthesia Postprocedure Evaluation (Signed)
  Anesthesia Post-op Note  Patient: Helen Cabrera  Procedure(s) Performed: Procedure(s): RIGHT PARS PLANA VITRECTOMY WITH 25 GAUGE/ENDO LASER/LENSECTOMY/INSERTION OF SILICONE OIL,REPAIR COMPLEX RETINAL DETACHMENT,MEMBRANE PEEL, IRIDECTOMY (Right)  Patient Location: PACU  Anesthesia Type:General  Level of Consciousness: awake, alert  and oriented  Airway and Oxygen Therapy: Patient Spontanous Breathing  Post-op Pain: mild  Post-op Assessment: Post-op Vital signs reviewed  Post-op Vital Signs: Reviewed  Last Vitals:  Filed Vitals:   04/02/14 1700  BP: 132/61  Pulse:   Temp:   Resp:     Complications: No apparent anesthesia complications
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
## 2014-04-02 NOTE — Anesthesia Procedure Notes (Signed)
Procedure Name: Intubation Date/Time: 04/02/2014 2:22 PM Performed by: Octavio Graves Pre-anesthesia Checklist: Patient identified, Timeout performed, Emergency Drugs available, Suction available and Patient being monitored Patient Re-evaluated:Patient Re-evaluated prior to inductionOxygen Delivery Method: Circle system utilized Preoxygenation: Pre-oxygenation with 100% oxygen Intubation Type: IV induction Ventilation: Mask ventilation without difficulty Laryngoscope Size: Miller and 2 Grade View: Grade I Tube type: Oral Tube size: 7.0 mm Number of attempts: 1 Airway Equipment and Method: Stylet Placement Confirmation: ETT inserted through vocal cords under direct vision,  breath sounds checked- equal and bilateral and positive ETCO2 Secured at: 22 cm Tube secured with: Tape Dental Injury: Teeth and Oropharynx as per pre-operative assessment  Comments: IV induction Fitzgerald- intubation AM CRNA atraumatic- teeth and mouth as preop- Bilat BS = Ola Spurr

## 2014-04-02 NOTE — Anesthesia Preprocedure Evaluation (Addendum)
Anesthesia Evaluation  Patient identified by MRN, date of birth, ID band Patient awake    Reviewed: Allergy & Precautions, H&P , NPO status , Patient's Chart, lab work & pertinent test results  Airway Mallampati: I TM Distance: >3 FB Neck ROM: Full    Dental  (+) Teeth Intact, Dental Advisory Given   Pulmonary neg pulmonary ROS,  breath sounds clear to auscultation        Cardiovascular hypertension, Pt. on home beta blockers Rhythm:Regular Rate:Normal     Neuro/Psych  Headaches, PSYCHIATRIC DISORDERS (+Dementia) Depression    GI/Hepatic negative GI ROS, Neg liver ROS,   Endo/Other  negative endocrine ROS  Renal/GU negative Renal ROS  negative genitourinary   Musculoskeletal negative musculoskeletal ROS (+)   Abdominal   Peds  Hematology negative hematology ROS (+)   Anesthesia Other Findings   Reproductive/Obstetrics negative OB ROS                          Anesthesia Physical Anesthesia Plan  ASA: II  Anesthesia Plan: General   Post-op Pain Management:    Induction: Intravenous  Airway Management Planned: Oral ETT and LMA  Additional Equipment: None  Intra-op Plan:   Post-operative Plan: Extubation in OR  Informed Consent: I have reviewed the patients History and Physical, chart, labs and discussed the procedure including the risks, benefits and alternatives for the proposed anesthesia with the patient or authorized representative who has indicated his/her understanding and acceptance.   Dental advisory given  Plan Discussed with: CRNA, Surgeon and Anesthesiologist  Anesthesia Plan Comments:         Anesthesia Quick Evaluation

## 2014-04-02 NOTE — Discharge Instructions (Signed)

## 2014-04-03 ENCOUNTER — Ambulatory Visit
Admission: RE | Admit: 2014-04-03 | Discharge: 2014-04-03 | Disposition: A | Discharge status: Home / Self Care | Payer: 59 | Source: Ambulatory Visit | Attending: Physician Assistant | Admitting: Physician Assistant

## 2014-04-03 DIAGNOSIS — F05 Delirium due to known physiological condition: Secondary | ICD-10-CM

## 2014-04-03 DIAGNOSIS — R51 Headache: Secondary | ICD-10-CM

## 2014-04-03 DIAGNOSIS — R443 Hallucinations, unspecified: Secondary | ICD-10-CM
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
## 2014-04-06 ENCOUNTER — Encounter (HOSPITAL_COMMUNITY): Payer: Self-pay | Admitting: Ophthalmology

## 2014-04-13 ENCOUNTER — Encounter (HOSPITAL_COMMUNITY): Payer: Self-pay | Admitting: Ophthalmology

## 2014-04-16 ENCOUNTER — Encounter: Payer: Self-pay | Admitting: Internal Medicine

## 2014-04-16 ENCOUNTER — Other Ambulatory Visit: Payer: Self-pay | Admitting: Internal Medicine

## 2014-04-16 ENCOUNTER — Ambulatory Visit (INDEPENDENT_AMBULATORY_CARE_PROVIDER_SITE_OTHER): Payer: 59 | Admitting: Internal Medicine

## 2014-04-16 VITALS — BP 124/80 | HR 60 | Temp 97.9°F | Resp 16 | Ht 64.5 in | Wt 178.4 lb

## 2014-04-16 DIAGNOSIS — R7402 Elevation of levels of lactic acid dehydrogenase (LDH): Secondary | ICD-10-CM

## 2014-04-16 DIAGNOSIS — I10 Essential (primary) hypertension: Secondary | ICD-10-CM

## 2014-04-16 DIAGNOSIS — Z0000 Encounter for general adult medical examination without abnormal findings: Secondary | ICD-10-CM

## 2014-04-16 DIAGNOSIS — E538 Deficiency of other specified B group vitamins: Secondary | ICD-10-CM

## 2014-04-16 DIAGNOSIS — Z111 Encounter for screening for respiratory tuberculosis: Secondary | ICD-10-CM

## 2014-04-16 DIAGNOSIS — G309 Alzheimer's disease, unspecified: Secondary | ICD-10-CM

## 2014-04-16 DIAGNOSIS — F0280 Dementia in other diseases classified elsewhere without behavioral disturbance: Secondary | ICD-10-CM

## 2014-04-16 DIAGNOSIS — G301 Alzheimer's disease with late onset: Secondary | ICD-10-CM

## 2014-04-16 DIAGNOSIS — R7309 Other abnormal glucose: Secondary | ICD-10-CM

## 2014-04-16 DIAGNOSIS — Z1212 Encounter for screening for malignant neoplasm of rectum: Secondary | ICD-10-CM

## 2014-04-16 DIAGNOSIS — E559 Vitamin D deficiency, unspecified: Secondary | ICD-10-CM

## 2014-04-16 DIAGNOSIS — R740 Nonspecific elevation of levels of transaminase and lactic acid dehydrogenase [LDH]: Secondary | ICD-10-CM

## 2014-04-16 DIAGNOSIS — E785 Hyperlipidemia, unspecified: Secondary | ICD-10-CM

## 2014-04-16 DIAGNOSIS — Z113 Encounter for screening for infections with a predominantly sexual mode of transmission: Secondary | ICD-10-CM

## 2014-04-16 LAB — CBC WITH DIFFERENTIAL/PLATELET
Basophils Absolute: 0.1 10*3/uL (ref 0.0–0.1)
Basophils Relative: 1 % (ref 0–1)
EOS ABS: 0.2 10*3/uL (ref 0.0–0.7)
Eosinophils Relative: 3 % (ref 0–5)
HEMATOCRIT: 40 % (ref 36.0–46.0)
HEMOGLOBIN: 13.2 g/dL (ref 12.0–15.0)
Lymphocytes Relative: 22 % (ref 12–46)
Lymphs Abs: 1.6 10*3/uL (ref 0.7–4.0)
MCH: 28.6 pg (ref 26.0–34.0)
MCHC: 33 g/dL (ref 30.0–36.0)
MCV: 86.6 fL (ref 78.0–100.0)
MONO ABS: 0.4 10*3/uL (ref 0.1–1.0)
Monocytes Relative: 6 % (ref 3–12)
NEUTROS PCT: 68 % (ref 43–77)
Neutro Abs: 5 10*3/uL (ref 1.7–7.7)
Platelets: 246 10*3/uL (ref 150–400)
RBC: 4.62 MIL/uL (ref 3.87–5.11)
RDW: 13.7 % (ref 11.5–15.5)
WBC: 7.4 10*3/uL (ref 4.0–10.5)

## 2014-04-16 MED ORDER — VITAMIN D (ERGOCALCIFEROL) 1.25 MG (50000 UNIT) PO CAPS: ORAL_CAPSULE | ORAL | Status: DC

## 2014-04-16 NOTE — Patient Instructions (Signed)
Recommend the book "The END of DIETING" by Dr Baker Janus   and the book "The END of DIABETES " by Dr Excell Seltzer  At [email or web address] - get book & Audio CD's      Being diabetic has a  300% increased risk for heart attack, stroke, cancer, and alzheimer- type vascular dementia. It is very important that you work harder with diet by avoiding all foods that are white except chicken & fish. Avoid white rice (brown & wild rice is OK), white potatoes (sweetpotatoes in moderation is OK), White bread or wheat bread or anything made out of white flour like bagels, donuts, rolls, buns, biscuits, cakes, pastries, cookies, pizza crust, and pasta (made from white flour & egg whites) - vegetarian pasta or spinach or wheat pasta is OK. Multigrain breads like Arnold's or [employer], or multigrain sandwich thins or flatbreads.  Diet, exercise and weight loss can reverse and cure diabetes in the early stages.  Diet, exercise and weight loss is very important in the control and prevention of complications of diabetes which affects every system in your body, ie. Brain - dementia/stroke, eyes - glaucoma/blindness, heart - heart attack/heart failure, kidneys - dialysis, stomach - gastric paralysis, intestines - malabsorption, nerves - severe painful neuritis, circulation - gangrene & loss of a leg(s), and finally cancer and Alzheimers.    I recommend avoid fried & greasy foods,  sweets/candy, white rice (brown or wild rice or Quinoa is OK), white potatoes (sweet potatoes are OK) - anything made from white flour - bagels, doughnuts, rolls, buns, biscuits,white and wheat breads, pizza crust and traditional pasta made of white flour & egg white(vegetarian pasta or spinach or wheat pasta is OK).  Multi-grain bread is OK - like multi-grain flat bread or sandwich thins. Avoid alcohol in excess. Exercise is also important.    Eat all the vegetables you want - avoid meat, especially red meat and dairy - especially cheese.  Cheese  is the most concentrated form of trans-fats which is the worst thing to clog up our arteries. Veggie cheese is OK which can be found in the fresh produce section at [employer] or [employer] or [employer]  Preventive Care for Adults A healthy lifestyle and preventive care can promote health and wellness. Preventive health guidelines for women include the following key practices.  A routine yearly physical is a good way to check with your health care provider about your health and preventive screening. It is a chance to share any concerns and updates on your health and to receive a thorough exam.  Visit your dentist for a routine exam and preventive care every 6 months. Brush your teeth twice a day and floss once a day. Good oral hygiene prevents tooth decay and gum disease.  The frequency of eye exams is based on your age, health, family medical history, use of contact lenses, and other factors. Follow your health care provider's recommendations for frequency of eye exams.  Eat a healthy diet. Foods like vegetables, fruits, whole grains, low-fat dairy products, and lean protein foods contain the nutrients you need without too many calories. Decrease your intake of foods high in solid fats, added sugars, and salt. Eat the right amount of calories for you.Get information about a proper diet from your health care provider, if necessary.  Regular physical exercise is one of the most important things you can do for your health. Most adults should get at least 150 minutes of moderate-intensity exercise (any activity that increases  your heart rate and causes you to sweat) each week. In addition, most adults need muscle-strengthening exercises on 2 or more days a week.  Maintain a healthy weight. The body mass index (BMI) is a screening tool to identify possible weight problems. It provides an estimate of body fat based on height and weight. Your health care provider can find your BMI and can help you  achieve or maintain a healthy weight.For adults 20 years and older:  A BMI below 18.5 is considered underweight.  A BMI of 18.5 to 24.9 is normal.  A BMI of 25 to 29.9 is considered overweight.  A BMI of 30 and above is considered obese.  Maintain normal blood lipids and cholesterol levels by exercising and minimizing your intake of saturated fat. Eat a balanced diet with plenty of fruit and vegetables. Blood tests for lipids and cholesterol should begin at age 43 and be repeated every 5 years. If your lipid or cholesterol levels are high, you are over 50, or you are at high risk for heart disease, you may need your cholesterol levels checked more frequently.Ongoing high lipid and cholesterol levels should be treated with medicines if diet and exercise are not working.  If you smoke, find out from your health care provider how to quit. If you do not use tobacco, do not start.  Lung cancer screening is recommended for adults aged 40-80 years who are at high risk for developing lung cancer because of a history of smoking. A yearly low-dose CT scan of the lungs is recommended for people who have at least a 30-pack-year history of smoking and are a current smoker or have quit within the past 15 years. A pack year of smoking is smoking an average of 1 pack of cigarettes a day for 1 year (for example: 1 pack a day for 30 years or 2 packs a day for 15 years). Yearly screening should continue until the smoker has stopped smoking for at least 15 years. Yearly screening should be stopped for people who develop a health problem that would prevent them from having lung cancer treatment.  If you are pregnant, do not drink alcohol. If you are breastfeeding, be very cautious about drinking alcohol. If you are not pregnant and choose to drink alcohol, do not have more than 1 drink per day. One drink is considered to be 12 ounces (355 mL) of beer, 5 ounces (148 mL) of wine, or 1.5 ounces (44 mL) of liquor.  Avoid  use of street drugs. Do not share needles with anyone. Ask for help if you need support or instructions about stopping the use of drugs.  High blood pressure causes heart disease and increases the risk of stroke. Your blood pressure should be checked at least every 1 to 2 years. Ongoing high blood pressure should be treated with medicines if weight loss and exercise do not work.  If you are 74-43 years old, ask your health care provider if you should take aspirin to prevent strokes.  Diabetes screening involves taking a blood sample to check your fasting blood sugar level. This should be done once every 3 years, after age 105, if you are within normal weight and without risk factors for diabetes. Testing should be considered at a younger age or be carried out more frequently if you are overweight and have at least 1 risk factor for diabetes.  Breast cancer screening is essential preventive care for women. You should practice "breast self-awareness." This means understanding the  normal appearance and feel of your breasts and may include breast self-examination. Any changes detected, no matter how small, should be reported to a health care provider. Women in their 77s and 30s should have a clinical breast exam (CBE) by a health care provider as part of a regular health exam every 1 to 3 years. After age 52, women should have a CBE every year. Starting at age 59, women should consider having a mammogram (breast X-ray test) every year. Women who have a family history of breast cancer should talk to their health care provider about genetic screening. Women at a high risk of breast cancer should talk to their health care providers about having an MRI and a mammogram every year.  Breast cancer gene (BRCA)-related cancer risk assessment is recommended for women who have family members with BRCA-related cancers. BRCA-related cancers include breast, ovarian, tubal, and peritoneal cancers. Having family members with  these cancers may be associated with an increased risk for harmful changes (mutations) in the breast cancer genes BRCA1 and BRCA2. Results of the assessment will determine the need for genetic counseling and BRCA1 and BRCA2 testing.  Routine pelvic exams to screen for cancer are no longer recommended for nonpregnant women who are considered low risk for cancer of the pelvic organs (ovaries, uterus, and vagina) and who do not have symptoms. Ask your health care provider if a screening pelvic exam is right for you.  If you have had past treatment for cervical cancer or a condition that could lead to cancer, you need Pap tests and screening for cancer for at least 20 years after your treatment. If Pap tests have been discontinued, your risk factors (such as having a new sexual partner) need to be reassessed to determine if screening should be resumed. Some women have medical problems that increase the chance of getting cervical cancer. In these cases, your health care provider may recommend more frequent screening and Pap tests.  The HPV test is an additional test that may be used for cervical cancer screening. The HPV test looks for the virus that can cause the cell changes on the cervix. The cells collected during the Pap test can be tested for HPV. The HPV test could be used to screen women aged 43 years and older, and should be used in women of any age who have unclear Pap test results. After the age of 50, women should have HPV testing at the same frequency as a Pap test.  Colorectal cancer can be detected and often prevented. Most routine colorectal cancer screening begins at the age of 30 years and continues through age 66 years. However, your health care provider may recommend screening at an earlier age if you have risk factors for colon cancer. On a yearly basis, your health care provider may provide home test kits to check for hidden blood in the stool. Use of a small camera at the end of a tube, to  directly examine the colon (sigmoidoscopy or colonoscopy), can detect the earliest forms of colorectal cancer. Talk to your health care provider about this at age 43, when routine screening begins. Direct exam of the colon should be repeated every 5-10 years through age 56 years, unless early forms of pre-cancerous polyps or small growths are found.  People who are at an increased risk for hepatitis B should be screened for this virus. You are considered at high risk for hepatitis B if:  You were born in a country where hepatitis B occurs  often. Talk with your health care provider about which countries are considered high risk.  Your parents were born in a high-risk country and you have not received a shot to protect against hepatitis B (hepatitis B vaccine).  You have HIV or AIDS.  You use needles to inject street drugs.  You live with, or have sex with, someone who has hepatitis B.  You get hemodialysis treatment.  You take certain medicines for conditions like cancer, organ transplantation, and autoimmune conditions.  Hepatitis C blood testing is recommended for all people born from [birth year] through 1965 and any individual with known risks for hepatitis C.  Practice safe sex. Use condoms and avoid high-risk sexual practices to reduce the spread of sexually transmitted infections (STIs). STIs include gonorrhea, chlamydia, syphilis, trichomonas, herpes, HPV, and human immunodeficiency virus (HIV). Herpes, HIV, and HPV are viral illnesses that have no cure. They can result in disability, cancer, and death.  You should be screened for sexually transmitted illnesses (STIs) including gonorrhea and chlamydia if:  You are sexually active and are younger than 24 years.  You are older than 24 years and your health care provider tells you that you are at risk for this type of infection.  Your sexual activity has changed since you were last screened and you are at an increased risk for chlamydia or  gonorrhea. Ask your health care provider if you are at risk.  If you are at risk of being infected with HIV, it is recommended that you take a prescription medicine daily to prevent HIV infection. This is called preexposure prophylaxis (PrEP). You are considered at risk if:  You are a heterosexual woman, are sexually active, and are at increased risk for HIV infection.  You take drugs by injection.  You are sexually active with a partner who has HIV.  Talk with your health care provider about whether you are at high risk of being infected with HIV. If you choose to begin PrEP, you should first be tested for HIV. You should then be tested every 3 months for as long as you are taking PrEP.  Osteoporosis is a disease in which the bones lose minerals and strength with aging. This can result in serious bone fractures or breaks. The risk of osteoporosis can be identified using a bone density scan. Women ages 35 years and over and women at risk for fractures or osteoporosis should discuss screening with their health care providers. Ask your health care provider whether you should take a calcium supplement or vitamin D to reduce the rate of osteoporosis.  Menopause can be associated with physical symptoms and risks. Hormone replacement therapy is available to decrease symptoms and risks. You should talk to your health care provider about whether hormone replacement therapy is right for you.  Use sunscreen. Apply sunscreen liberally and repeatedly throughout the day. You should seek shade when your shadow is shorter than you. Protect yourself by wearing long sleeves, pants, a wide-brimmed hat, and sunglasses year round, whenever you are outdoors.  Once a month, do a whole body skin exam, using a mirror to look at the skin on your back. Tell your health care provider of new moles, moles that have irregular borders, moles that are larger than a pencil eraser, or moles that have changed in shape or  color.  Stay current with required vaccines (immunizations).  Influenza vaccine. All adults should be immunized every year.  Tetanus, diphtheria, and acellular pertussis (Td, Tdap) vaccine. Pregnant women should receive  1 dose of Tdap vaccine during each pregnancy. The dose should be obtained regardless of the length of time since the last dose. Immunization is preferred during the 27th-36th week of gestation. An adult who has not previously received Tdap or who does not know her vaccine status should receive 1 dose of Tdap. This initial dose should be followed by tetanus and diphtheria toxoids (Td) booster doses every 10 years. Adults with an unknown or incomplete history of completing a 3-dose immunization series with Td-containing vaccines should begin or complete a primary immunization series including a Tdap dose. Adults should receive a Td booster every 10 years.  Varicella vaccine. An adult without evidence of immunity to varicella should receive 2 doses or a second dose if she has previously received 1 dose. Pregnant females who do not have evidence of immunity should receive the first dose after pregnancy. This first dose should be obtained before leaving the health care facility. The second dose should be obtained 4-8 weeks after the first dose.  Human papillomavirus (HPV) vaccine. Females aged 13-26 years who have not received the vaccine previously should obtain the 3-dose series. The vaccine is not recommended for use in pregnant females. However, pregnancy testing is not needed before receiving a dose. If a female is found to be pregnant after receiving a dose, no treatment is needed. In that case, the remaining doses should be delayed until after the pregnancy. Immunization is recommended for any person with an immunocompromised condition through the age of 32 years if she did not get any or all doses earlier. During the 3-dose series, the second dose should be obtained 4-8 weeks after the  first dose. The third dose should be obtained 24 weeks after the first dose and 16 weeks after the second dose.  Zoster vaccine. One dose is recommended for adults aged 26 years or older unless certain conditions are present.  Measles, mumps, and rubella (MMR) vaccine. Adults born before [birth year] generally are considered immune to measles and mumps. Adults born in [birth year] or later should have 1 or more doses of MMR vaccine unless there is a contraindication to the vaccine or there is laboratory evidence of immunity to each of the three diseases. A routine second dose of MMR vaccine should be obtained at least 28 days after the first dose for students attending postsecondary schools, health care workers, or international travelers. People who received inactivated measles vaccine or an unknown type of measles vaccine during 1963-1967 should receive 2 doses of MMR vaccine. People who received inactivated mumps vaccine or an unknown type of mumps vaccine before 1979 and are at high risk for mumps infection should consider immunization with 2 doses of MMR vaccine. For females of childbearing age, rubella immunity should be determined. If there is no evidence of immunity, females who are not pregnant should be vaccinated. If there is no evidence of immunity, females who are pregnant should delay immunization until after pregnancy. Unvaccinated health care workers born before [birth year] who lack laboratory evidence of measles, mumps, or rubella immunity or laboratory confirmation of disease should consider measles and mumps immunization with 2 doses of MMR vaccine or rubella immunization with 1 dose of MMR vaccine.  Pneumococcal 13-valent conjugate (PCV13) vaccine. When indicated, a person who is uncertain of her immunization history and has no record of immunization should receive the PCV13 vaccine. An adult aged 47 years or older who has certain medical conditions and has not been previously immunized should receive 1 dose of  PCV13 vaccine. This PCV13 should be followed with a dose of pneumococcal polysaccharide (PPSV23) vaccine. The PPSV23 vaccine dose should be obtained at least 8 weeks after the dose of PCV13 vaccine. An adult aged 66 years or older who has certain medical conditions and previously received 1 or more doses of PPSV23 vaccine should receive 1 dose of PCV13. The PCV13 vaccine dose should be obtained 1 or more years after the last PPSV23 vaccine dose.  Pneumococcal polysaccharide (PPSV23) vaccine. When PCV13 is also indicated, PCV13 should be obtained first. All adults aged 41 years and older should be immunized. An adult younger than age 20 years who has certain medical conditions should be immunized. Any person who resides in a nursing home or long-term care facility should be immunized. An adult smoker should be immunized. People with an immunocompromised condition and certain other conditions should receive both PCV13 and PPSV23 vaccines. People with human immunodeficiency virus (HIV) infection should be immunized as soon as possible after diagnosis. Immunization during chemotherapy or radiation therapy should be avoided. Routine use of PPSV23 vaccine is not recommended for American Indians, [place] Natives, or people younger than 65 years unless there are medical conditions that require PPSV23 vaccine. When indicated, people who have unknown immunization and have no record of immunization should receive PPSV23 vaccine. One-time revaccination 5 years after the first dose of PPSV23 is recommended for people aged 19-64 years who have chronic kidney failure, nephrotic syndrome, asplenia, or immunocompromised conditions. People who received 1-2 doses of PPSV23 before age 33 years should receive another dose of PPSV23 vaccine at age 19 years or later if at least 5 years have passed since the previous dose. Doses of PPSV23 are not needed for people immunized with PPSV23 at or after age 15 years.  Meningococcal vaccine.  Adults with asplenia or persistent complement component deficiencies should receive 2 doses of quadrivalent meningococcal conjugate (MenACWY-D) vaccine. The doses should be obtained at least 2 months apart. Microbiologists working with certain meningococcal bacteria, [place] [place] recruits, people at risk during an outbreak, and people who travel to or live in countries with a high rate of meningitis should be immunized. A first-year college student up through age 52 years who is living in a residence hall should receive a dose if she did not receive a dose on or after her 16th birthday. Adults who have certain high-risk conditions should receive one or more doses of vaccine.  Hepatitis A vaccine. Adults who wish to be protected from this disease, have certain high-risk conditions, work with hepatitis A-infected animals, work in hepatitis A research labs, or travel to or work in countries with a high rate of hepatitis A should be immunized. Adults who were previously unvaccinated and who anticipate close contact with an international adoptee during the first 60 days after arrival in the [place] from a country with a high rate of hepatitis A should be immunized.  Hepatitis B vaccine. Adults who wish to be protected from this disease, have certain high-risk conditions, may be exposed to blood or other infectious body fluids, are household contacts or sex partners of hepatitis B positive people, are clients or workers in certain care facilities, or travel to or work in countries with a high rate of hepatitis B should be immunized.  Haemophilus influenzae type b (Hib) vaccine. A previously unvaccinated person with asplenia or sickle cell disease or having a scheduled splenectomy should receive 1 dose of Hib vaccine. Regardless of previous immunization, a recipient of a hematopoietic stem  cell transplant should receive a 3-dose series 6-12 months after her successful transplant. Hib vaccine is not recommended for  adults with HIV infection. Preventive Services / Frequency   Blood pressure check.** / Every 1 to 2 years.  Lipid and cholesterol check.** / Every 5 years beginning at age 65 years.  Lung cancer screening. / Every year if you are aged 84-80 years and have a 30-pack-year history of smoking and currently smoke or have quit within the past 15 years. Yearly screening is stopped once you have quit smoking for at least 15 years or develop a health problem that would prevent you from having lung cancer treatment.  Clinical breast exam.** / Every year after age 32 years.  BRCA-related cancer risk assessment.** / For women who have family members with a BRCA-related cancer (breast, ovarian, tubal, or peritoneal cancers).  Mammogram.** / Every year beginning at age 62 years and continuing for as long as you are in good health. Consult with your health care provider.  Pap test.** / Every 3 years starting at age 54 years through age 108 or 56 years with a history of 3 consecutive normal Pap tests.  HPV screening.** / Every 3 years from ages 60 years through ages 46 to 14 years with a history of 3 consecutive normal Pap tests.  Fecal occult blood test (FOBT) of stool. / Every year beginning at age 65 years and continuing until age 63 years. You may not need to do this test if you get a colonoscopy every 10 years.  Flexible sigmoidoscopy or colonoscopy.** / Every 5 years for a flexible sigmoidoscopy or every 10 years for a colonoscopy beginning at age 49 years and continuing until age 109 years.  Hepatitis C blood test.** / For all people born from [birth year] through 1966 and any individual with known risks for hepatitis C.  Skin self-exam. / Monthly.  Influenza vaccine. / Every year.  Tetanus, diphtheria, and acellular pertussis (Tdap/Td) vaccine.** / Consult your health care provider. Pregnant women should receive 1 dose of Tdap vaccine during each pregnancy. 1 dose of Td every 10 years.  Varicella  vaccine.** / Consult your health care provider. Pregnant females who do not have evidence of immunity should receive the first dose after pregnancy.  Zoster vaccine.** / 1 dose for adults aged 15 years or older.  Measles, mumps, rubella (MMR) vaccine.** / You need at least 1 dose of MMR if you were born in 1957 or later. You may also need a 2nd dose. For females of childbearing age, rubella immunity should be determined. If there is no evidence of immunity, females who are not pregnant should be vaccinated. If there is no evidence of immunity, females who are pregnant should delay immunization until after pregnancy.  Pneumococcal 13-valent conjugate (PCV13) vaccine.** / Consult your health care provider.  Pneumococcal polysaccharide (PPSV23) vaccine.** / 1 to 2 doses if you smoke cigarettes or if you have certain conditions.  Meningococcal vaccine.** / Consult your health care provider.  Hepatitis A vaccine.** / Consult your health care provider.  Hepatitis B vaccine.** / Consult your health care provider.  Haemophilus influenzae type b (Hib) vaccine.** / Consult your health care provider.
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
## 2014-04-17 LAB — MICROALBUMIN / CREATININE URINE RATIO
Creatinine, Urine: 360.3 mg/dL
MICROALB UR: 2.3 mg/dL — AB (ref <2.0)
MICROALB/CREAT RATIO: 6.4 mg/g (ref 0.0–30.0)

## 2014-04-17 LAB — HEPATIC FUNCTION PANEL
ALBUMIN: 3.9 g/dL (ref 3.5–5.2)
ALT: 12 U/L (ref 0–35)
AST: 16 U/L (ref 0–37)
Alkaline Phosphatase: 96 U/L (ref 39–117)
Bilirubin, Direct: 0.1 mg/dL (ref 0.0–0.3)
Indirect Bilirubin: 0.3 mg/dL (ref 0.2–1.2)
TOTAL PROTEIN: 6.5 g/dL (ref 6.0–8.3)
Total Bilirubin: 0.4 mg/dL (ref 0.2–1.2)

## 2014-04-17 LAB — IRON AND TIBC
%SAT: 25 % (ref 20–55)
IRON: 77 ug/dL (ref 42–145)
TIBC: 302 ug/dL (ref 250–470)
UIBC: 225 ug/dL (ref 125–400)

## 2014-04-17 LAB — BASIC METABOLIC PANEL WITH GFR
BUN: 14 mg/dL (ref 6–23)
CALCIUM: 9.3 mg/dL (ref 8.4–10.5)
CO2: 31 mEq/L (ref 19–32)
Chloride: 103 mEq/L (ref 96–112)
Creat: 1.14 mg/dL — ABNORMAL HIGH (ref 0.50–1.10)
GFR, EST AFRICAN AMERICAN: 61 mL/min
GFR, Est Non African American: 53 mL/min — ABNORMAL LOW
GLUCOSE: 88 mg/dL (ref 70–99)
Potassium: 3.6 mEq/L (ref 3.5–5.3)
Sodium: 143 mEq/L (ref 135–145)

## 2014-04-17 LAB — INSULIN, FASTING: INSULIN FASTING, SERUM: 23.4 u[IU]/mL — AB (ref 2.0–19.6)

## 2014-04-17 LAB — HEPATITIS A ANTIBODY, TOTAL: Hep A Total Ab: NONREACTIVE

## 2014-04-17 LAB — HEPATITIS C ANTIBODY: HCV Ab: NEGATIVE

## 2014-04-17 LAB — VITAMIN D 25 HYDROXY (VIT D DEFICIENCY, FRACTURES): VIT D 25 HYDROXY: 73 ng/mL (ref 30–89)

## 2014-04-17 LAB — LIPID PANEL
CHOLESTEROL: 161 mg/dL (ref 0–200)
HDL: 52 mg/dL (ref >39)
LDL Cholesterol: 76 mg/dL (ref 0–99)
Total CHOL/HDL Ratio: 3.1 Ratio
Triglycerides: 165 mg/dL — ABNORMAL HIGH (ref <150)
VLDL: 33 mg/dL (ref 0–40)

## 2014-04-17 LAB — TSH: TSH: 0.219 u[IU]/mL — ABNORMAL LOW (ref 0.350–4.500)

## 2014-04-17 LAB — HEPATITIS B SURFACE ANTIBODY,QUALITATIVE: Hep B S Ab: NEGATIVE

## 2014-04-17 LAB — URINALYSIS, MICROSCOPIC ONLY
BACTERIA UA: NONE SEEN
Casts: NONE SEEN

## 2014-04-17 LAB — HIV ANTIBODY (ROUTINE TESTING W REFLEX): HIV 1&2 Ab, 4th Generation: NONREACTIVE

## 2014-04-17 LAB — RPR

## 2014-04-17 LAB — HEMOGLOBIN A1C
HEMOGLOBIN A1C: 6.1 % — AB (ref <5.7)
MEAN PLASMA GLUCOSE: 128 mg/dL — AB (ref <117)

## 2014-04-17 LAB — VITAMIN B12: VITAMIN B 12: 1104 pg/mL — AB (ref 211–911)

## 2014-04-17 LAB — HEPATITIS B CORE ANTIBODY, TOTAL: Hep B Core Total Ab: NONREACTIVE

## 2014-04-17 LAB — MAGNESIUM: Magnesium: 2 mg/dL (ref 1.5–2.5)

## 2014-04-19 LAB — TB SKIN TEST
Induration: 0 mm
TB Skin Test: NEGATIVE

## 2014-04-20 LAB — HEPATITIS B E ANTIBODY: Hepatitis Be Antibody: NONREACTIVE

## 2014-05-21 ENCOUNTER — Ambulatory Visit (INDEPENDENT_AMBULATORY_CARE_PROVIDER_SITE_OTHER): Payer: 59 | Admitting: *Deleted

## 2014-05-21 DIAGNOSIS — R899 Unspecified abnormal finding in specimens from other organs, systems and tissues: Secondary | ICD-10-CM

## 2014-05-21 LAB — TSH: TSH: 0.663 u[IU]/mL (ref 0.350–4.500)

## 2014-05-21 NOTE — Progress Notes (Signed)
Patient ID: Helen Cabrera, female   DOB: 1956-02-21, 58 y.o.   MRN: [id number] Patient presents for 1 month recheck TSH.

## 2014-06-02 ENCOUNTER — Encounter: Payer: Self-pay | Admitting: Physician Assistant

## 2014-06-02 ENCOUNTER — Ambulatory Visit (INDEPENDENT_AMBULATORY_CARE_PROVIDER_SITE_OTHER): Payer: 59 | Admitting: Physician Assistant

## 2014-06-02 VITALS — BP 118/78 | HR 62 | Temp 98.2°F | Resp 16 | Ht 64.75 in | Wt 165.0 lb

## 2014-06-02 DIAGNOSIS — R1084 Generalized abdominal pain: Secondary | ICD-10-CM

## 2014-06-02 DIAGNOSIS — R112 Nausea with vomiting, unspecified: Secondary | ICD-10-CM

## 2014-06-02 LAB — CBC WITH DIFFERENTIAL/PLATELET
Basophils Absolute: 0.1 10*3/uL (ref 0.0–0.1)
Basophils Relative: 1 % (ref 0–1)
EOS ABS: 0.2 10*3/uL (ref 0.0–0.7)
EOS PCT: 2 % (ref 0–5)
HCT: 42.3 % (ref 36.0–46.0)
Hemoglobin: 14.1 g/dL (ref 12.0–15.0)
Lymphocytes Relative: 28 % (ref 12–46)
Lymphs Abs: 2.2 10*3/uL (ref 0.7–4.0)
MCH: 28.9 pg (ref 26.0–34.0)
MCHC: 33.3 g/dL (ref 30.0–36.0)
MCV: 86.7 fL (ref 78.0–100.0)
Monocytes Absolute: 0.6 10*3/uL (ref 0.1–1.0)
Monocytes Relative: 8 % (ref 3–12)
Neutro Abs: 4.9 10*3/uL (ref 1.7–7.7)
Neutrophils Relative %: 61 % (ref 43–77)
PLATELETS: 313 10*3/uL (ref 150–400)
RBC: 4.88 MIL/uL (ref 3.87–5.11)
RDW: 13.7 % (ref 11.5–15.5)
WBC: 8 10*3/uL (ref 4.0–10.5)

## 2014-06-02 MED ORDER — ONDANSETRON HCL 8 MG PO TABS: 8.0000 mg | ORAL_TABLET | Freq: Three times a day (TID) | ORAL | Status: DC | PRN

## 2014-06-02 NOTE — Progress Notes (Signed)
Subjective:    Patient ID: Helen Cabrera, female    DOB: Jan 13, 1956, 58 y.o.   MRN: [id number]  Abdominal Pain This is a new problem. Episode onset: 2 weeks. The onset quality is gradual. The problem occurs intermittently. The problem has been unchanged. The pain is located in the generalized abdominal region. The pain is at a severity of 8/10. The pain is severe. The quality of the pain is dull. The abdominal pain radiates to the back. Associated symptoms include headaches, nausea, vomiting and weight loss. Pertinent negatives include no anorexia, arthralgias, belching, constipation, diarrhea, dysuria, fever, flatus, frequency, hematochezia, hematuria, melena or myalgias. Associated symptoms comments: Her husband states she stopped drinking regularly about 5 years ago.  He states she does have occasional Margarita.  States she only vomited once and denies bright red blood and coffee ground emesis.  . Exacerbated by: Patient states she cannot eat any other foods right now except for orange jello. The pain is relieved by nothing. She has tried nothing for the symptoms. The treatment provided no relief. Her past medical history is significant for abdominal surgery. Had 4 C-Sections and appendectomy and hysterectomy.  Last abdominal surgery was 1982.  Had eye surgery 2 months ago.  Last colonoscopy was 02/28/12 and showed diverticulosis on left side of colon and due for another one in 2023.  Dr Sheryn Bison performed the EGD and Colonoscopy.  EGD- Normal EGD, no celiac disease and probable IBS also done on 02/28/12.  Review of Systems  Constitutional: Positive for weight loss, appetite change and fatigue. Negative for fever and chills.       Will eat very little.  Eats orange jello.  Ate slaw and couple bites of hamburger.  [place] veggies with rice 2 days before.  Eyes: Negative.   Respiratory: Negative.   Gastrointestinal: Positive for nausea, vomiting and abdominal pain. Negative for diarrhea,  constipation, melena, hematochezia, anorexia and flatus.  Endocrine: Negative.   Genitourinary: Positive for flank pain and vaginal bleeding. Negative for dysuria, frequency and hematuria.  Musculoskeletal: Negative.  Negative for myalgias and arthralgias.  Skin: Negative.   Neurological: Positive for headaches.       Patient states she always has headaches and has not changed in severity.  Psychiatric/Behavioral: Negative.    Past Medical History  Diagnosis Date  . Diverticulosis of colon (without mention of hemorrhage)   . IBS (irritable bowel syndrome)   . Unspecified gastritis and gastroduodenitis without mention of hemorrhage   . Family history of malignant neoplasm of gastrointestinal tract   . Hypercholesterolemia   . Chronic headaches   . Vitamin B12 deficiency   . Anemia     Iron def   . Bacterial overgrowth syndrome   . Pancreatitis   . GERD (gastroesophageal reflux disease)   . Osteoarthritis   . Hypertension   . Depression   . Headache(784.0)   . Dementia    Current Outpatient Prescriptions on File Prior to Visit  Medication Sig Dispense Refill  . acetaminophen (TYLENOL) 325 MG tablet Take 650 mg by mouth every 6 (six) hours as needed for mild pain, fever or headache.    Marland Kitchen aspirin 81 MG tablet Take 81 mg by mouth at bedtime.     Marland Kitchen atenolol (TENORMIN) 100 MG tablet Take 50 mg by mouth at bedtime.     Marland Kitchen atorvastatin (LIPITOR) 40 MG tablet Take 40 mg by mouth every Monday, Wednesday, and Friday. Takes at bedtime    . clonazePAM (KLONOPIN) 0.5 MG  tablet Take 0.5 mg by mouth 2 (two) times daily as needed for anxiety.    . cyanocobalamin (,VITAMIN B-12,) 1000 MCG/ML injection Inject 1,000 mcg into the muscle every 14 (fourteen) days.    Marland Kitchen donepezil (ARICEPT) 10 MG tablet Take 10 mg by mouth at bedtime.    . Omega-3 Fatty Acids (THE VERY FINEST FISH OIL) LIQD Take 30 mLs by mouth at bedtime.    . Vitamin D, Ergocalciferol, (DRISDOL) 50000 UNITS CAPS capsule Take 1 capsule  daily or as directed for severe Vit D Deficiency 30 capsule 99   No current facility-administered medications on file prior to visit.   Allergies  Allergen Reactions  . Amitriptyline Other (See Comments)    dysphoria  . Carafate [Sucralfate] Other (See Comments)    constipation  . Amitriptyline Other (See Comments)    dysphoria  . Carafate [Sucralfate] Other (See Comments)    constipation  . Codeine Other (See Comments)    Insomnia; agitation; restlessness   . Lexapro [Escitalopram Oxalate] Other (See Comments)    Insomnia; agitation; restlessness   . Paxil [Paroxetine Hcl] Other (See Comments)    Insomnia; agitation; restlessness   . Prednisone Other (See Comments)    Unknown reaction  . Promethazine Nausea Only  . Zoloft [Sertraline Hcl] Other (See Comments)    Insomnia; agitation; restlessness   . Codeine Other (See Comments)    Insomnia; agitation; restlessness   . Lexapro [Escitalopram Oxalate] Other (See Comments)    Insomnia; agitation; restlessness   . Paxil [Paroxetine Hcl] Other (See Comments)    Insomnia; agitation; restlessness   . Prednisone Other (See Comments)    unknown  . Promethazine Hcl Nausea Only  . Zoloft [Sertraline Hcl] Other (See Comments)    Insomnia; agitation; restlessness      BP 118/78 mmHg  Pulse 62  Temp(Src) 98.2 F (36.8 C) (Temporal)  Resp 16  Ht 5' 4.75" (1.645 m)  Wt 165 lb (74.844 kg)  BMI 27.66 kg/m2  SpO2 96% Wt Readings from Last 3 Encounters:  06/02/14 165 lb (74.844 kg)  04/16/14 178 lb 6.4 oz (80.922 kg)  04/02/14 176 lb (79.833 kg)   Objective:   Physical Exam  Constitutional: She is oriented to person, place, and time. She appears well-developed and well-nourished. She has a sickly appearance. No distress.  HENT:  Head: Normocephalic and atraumatic.  Right Ear: Tympanic membrane, external ear and ear canal normal. No drainage or swelling. Tympanic membrane is not injected, not erythematous, not retracted and not  bulging.  Left Ear: Tympanic membrane, external ear and ear canal normal. No drainage or swelling. Tympanic membrane is not injected, not erythematous, not retracted and not bulging.  Nose: Rhinorrhea present. Right sinus exhibits no maxillary sinus tenderness and no frontal sinus tenderness. Left sinus exhibits maxillary sinus tenderness. Left sinus exhibits no frontal sinus tenderness.  Mouth/Throat: Uvula is midline, oropharynx is clear and moist and mucous membranes are normal. Mucous membranes are not pale and not dry. No trismus in the jaw. No uvula swelling. No oropharyngeal exudate, posterior oropharyngeal edema, posterior oropharyngeal erythema or tonsillar abscesses.  Cerumen in ears bilaterally, but could still see TMs.  Mild tenderness to left maxillary sinus upon palpation.  Eyes: Conjunctivae and lids are normal. Pupils are equal, round, and reactive to light. Right eye exhibits no discharge. Left eye exhibits no discharge. No scleral icterus.  Neck: Trachea normal, normal range of motion and phonation normal. Neck supple. No tracheal tenderness present. No tracheal deviation  present.  Cardiovascular: Normal rate, regular rhythm, S1 normal, S2 normal, normal heart sounds, intact distal pulses and normal pulses.  Exam reveals no gallop, no distant heart sounds and no friction rub.   No murmur heard. Pulmonary/Chest: Effort normal and breath sounds normal. No stridor. No respiratory distress. She has no decreased breath sounds. She has no wheezes. She has no rhonchi. She has no rales. She exhibits no tenderness.  Abdominal: Soft. Bowel sounds are normal. She exhibits no distension, no abdominal bruit, no pulsatile midline mass and no mass. There is no hepatosplenomegaly. There is tenderness in the right upper quadrant, epigastric area and left upper quadrant. There is CVA tenderness and positive Murphy's sign. There is no rebound and no guarding. No hernia.  Musculoskeletal: Normal range of  motion.  Lymphadenopathy:    She has no cervical adenopathy.  No LAD.  Neurological: She is alert and oriented to person, place, and time. She has normal strength.  Skin: Skin is warm, dry and intact. No rash noted. She is not diaphoretic. No cyanosis. Nails show no clubbing.  Vertical scar from umbilicus to pubic symphysis.    Psychiatric: She has a normal mood and affect. Her speech is normal and behavior is normal. Judgment and thought content normal.  Vitals reviewed.     Assessment & Plan:  1. Generalized abdominal pain Take Benefiber OTC (please follow directions on bottle).  This will help move the stool through colon since you have diverticulosis.  Ordered labs and ultrasound to R/O UTI, pancreatitis and cholecystitis.  Will call you with results.  Might have to refer you to Dr. Sheryn Bison (GI).   - CBC with Differential - BASIC METABOLIC PANEL WITH GFR - Hepatic function panel - Urinalysis, Routine w reflex microscopic - Urine culture - Amylase - Lipase - US Abdomen Complete; Future  2. Nausea and vomiting, vomiting of unspecified type Take Zofran 8mg  as prescribed for nausea.   Make sure you are drinking plenty of fluids to stay hydrated.  Continue to eat BRAT diet (bananas, rice, applesauce and toast).  Please be careful to the amount of bananas you eat as it can raise potassium level. - BASIC METABOLIC PANEL WITH GFR - Hepatic function panel - ondansetron (ZOFRAN) 8 MG tablet; Take 1 tablet (8 mg total) by mouth every 8 (eight) hours as needed for nausea or vomiting.  Dispense: 20 tablet; Refill: 1 - US Abdomen Complete; Future  Please follow up in 4 weeks or sooner if not better.  Might adjust follow up based on lab and ultrasound results.  Might just have her keep appt with Marchelle Folks on 07/28/14.  Discussed medication effects and SE's.  Pt agreed to treatment plan.  Zyir Gassert, Lise Auer, PA-C 7:26 PM [facility]

## 2014-06-02 NOTE — Patient Instructions (Addendum)
- Take the Zofran as prescribed for nausea. -Take benefiber OTC- follow directions on bottle.  Will help stool move along. -Make sure you are drinking plenty of fluids to stay hydrated.   - Continue to eat bland food (BRAT Diet).  Avoid acidic and irritating foods.  Please follow up in 4 weeks.  I will call you with results.  Cholecystitis Cholecystitis is an inflammation of your gallbladder. It is usually caused by a buildup of gallstones or sludge (cholelithiasis) in your gallbladder. The gallbladder stores a fluid that helps digest fats (bile). Cholecystitis is serious and needs treatment right away.  CAUSES   Gallstones. Gallstones can block the tube that leads to your gallbladder, causing bile to build up. As bile builds up, the gallbladder becomes inflamed.  Bile duct problems, such as blockage from scarring or kinking.  Tumors. Tumors can stop bile from leaving your gallbladder correctly, causing bile to build up. As bile builds up, the gallbladder becomes inflamed. SYMPTOMS   Nausea.  Vomiting.  Abdominal pain, especially in the upper right area of your abdomen.  Abdominal tenderness or bloating.  Sweating.  Chills.  Fever.  Yellowing of the skin and the whites of the eyes (jaundice). DIAGNOSIS  Your caregiver may order blood tests to look for infection or gallbladder problems. Your caregiver may also order imaging tests, such as an ultrasound or computed tomography (CT) scan. Further tests may include a hepatobiliary iminodiacetic acid (HIDA) scan. This scan allows your caregiver to see your bile move from the liver to the gallbladder and to the small intestine. TREATMENT  A hospital stay is usually necessary to lessen the inflammation of your gallbladder. You may be required to not eat or drink (fast) for a certain amount of time. You may be given medicine to treat pain or an antibiotic medicine to treat an infection. Surgery may be needed to remove your gallbladder  (cholecystectomy) once the inflammation has gone down. Surgery may be needed right away if you develop complications such as death of gallbladder tissue (gangrene) or a tear (perforation) of the gallbladder.  [place] care will depend on your treatment. In general:  If you were given antibiotics, take them as directed. Finish them even if you start to feel better.  Only take over-the-counter or prescription medicines for pain, discomfort, or fever as directed by your caregiver.  Follow a low-fat diet until you see your caregiver again.  Keep all follow-up visits as directed by your caregiver. SEEK IMMEDIATE MEDICAL CARE IF:   Your pain is increasing and not controlled by medicines.  Your pain moves to another part of your abdomen or to your back.  You have a fever.  You have nausea and vomiting. MAKE SURE YOU:  Understand these instructions.  Will watch your condition.  Will get help right away if you are not doing well or get worse. Document Released: 07/10/2005 Document Revised: 10/02/2011 Document Reviewed: 05/26/2011 [facility] Patient Information 2015 [place], [place]. This information is not intended to replace advice given to you by your health care provider. Make sure you discuss any questions you have with your health care provider.  Acute Pancreatitis Acute pancreatitis is a disease in which the pancreas becomes suddenly irritated (inflamed). The pancreas is a large gland behind your stomach. The pancreas makes enzymes that help digest food. The pancreas also makes 2 hormones that help control your blood sugar. Acute pancreatitis happens when the enzymes attack and damage the pancreas. Most attacks last a  couple of days and can cause serious problems. HOME CARE  Follow your doctor's diet instructions. You may need to avoid alcohol and limit fat in your diet.  Eat small meals often.  Drink enough fluids to keep your pee (urine) clear or pale  yellow.  Only take medicines as told by your doctor.  Avoid drinking alcohol if it caused your disease.  Do not smoke.  Get plenty of rest.  Check your blood sugar at home as told by your doctor.  Keep all doctor visits as told. GET HELP IF:  You do not get better as quickly as expected.  You have new or worsening symptoms.  You have lasting pain, weakness, or feel sick to your stomach (nauseous).  You get better and then have another pain attack. GET HELP RIGHT AWAY IF:   You are unable to eat or keep fluids down.  Your pain becomes severe.  You have a fever or lasting symptoms for more than 2 to 3 days.  You have a fever and your symptoms suddenly get worse.  Your skin or the white part of your eyes turn yellow (jaundice).  You throw up (vomit).  You feel dizzy, or you pass out (faint).  Your blood sugar is high (over 300 mg/dL). MAKE SURE YOU:   Understand these instructions.  Will watch your condition.  Will get help right away if you are not doing well or get worse. Document Released: 12/27/2007 Document Revised: 11/24/2013 Document Reviewed: 10/19/2011 [facility] Patient Information 2015 [place], [place]. This information is not intended to replace advice given to you by your health care provider. Make sure you discuss any questions you have with your health care provider.  Diverticulosis Diverticulosis is the condition that develops when small pouches (diverticula) form in the wall of your colon. Your colon, or large intestine, is where water is absorbed and stool is formed. The pouches form when the inside layer of your colon pushes through weak spots in the outer layers of your colon. CAUSES  No one knows exactly what causes diverticulosis. RISK FACTORS  Being older than 70. Your risk for this condition increases with age. Diverticulosis is rare in people younger than 40 years. By age 67, almost everyone has it.  Eating a low-fiber diet.  Being  frequently constipated.  Being overweight.  Not getting enough exercise.  Smoking.  Taking over-the-counter pain medicines, like aspirin and ibuprofen. SYMPTOMS  Most people with diverticulosis do not have symptoms. DIAGNOSIS  Because diverticulosis often has no symptoms, health care providers often discover the condition during an exam for other colon problems. In many cases, a health care provider will diagnose diverticulosis while using a flexible scope to examine the colon (colonoscopy). TREATMENT  If you have never developed an infection related to diverticulosis, you may not need treatment. If you have had an infection before, treatment may include:  Eating more fruits, vegetables, and grains.  Taking a fiber supplement.  Taking a live bacteria supplement (probiotic).  Taking medicine to relax your colon. HOME CARE INSTRUCTIONS   Drink at least 6-8 glasses of water each day to prevent constipation.  Try not to strain when you have a bowel movement.  Keep all follow-up appointments. If you have had an infection before:  Increase the fiber in your diet as directed by your health care provider or dietitian.  Take a dietary fiber supplement if your health care provider approves.  Only take medicines as directed by your health care provider. [place]  CARE IF:   You have abdominal pain.  You have bloating.  You have cramps.  You have not gone to the bathroom in 3 days. SEEK IMMEDIATE MEDICAL CARE IF:   Your pain gets worse.  Yourbloating becomes very bad.  You have a fever or chills, and your symptoms suddenly get worse.  You begin vomiting.  You have bowel movements that are bloody or black. MAKE SURE YOU:  Understand these instructions.  Will watch your condition.  Will get help right away if you are not doing well or get worse. Document Released: 04/06/2004 Document Revised: 07/15/2013 Document Reviewed: 06/04/2013 [facility] Patient  Information 2015 [place], [place]. This information is not intended to replace advice given to you by your health care provider. Make sure you discuss any questions you have with your health care provider.

## 2014-06-03 LAB — URINALYSIS, MICROSCOPIC ONLY
Bacteria, UA: NONE SEEN
Casts: NONE SEEN
Crystals: NONE SEEN
SQUAMOUS EPITHELIAL / LPF: NONE SEEN

## 2014-06-03 LAB — BASIC METABOLIC PANEL WITH GFR
BUN: 15 mg/dL (ref 6–23)
CALCIUM: 9.6 mg/dL (ref 8.4–10.5)
CO2: 28 meq/L (ref 19–32)
CREATININE: 1.02 mg/dL (ref 0.50–1.10)
Chloride: 100 mEq/L (ref 96–112)
GFR, Est African American: 70 mL/min
GFR, Est Non African American: 60 mL/min
Glucose, Bld: 104 mg/dL — ABNORMAL HIGH (ref 70–99)
Potassium: 3.8 mEq/L (ref 3.5–5.3)
Sodium: 140 mEq/L (ref 135–145)

## 2014-06-03 LAB — HEPATIC FUNCTION PANEL
ALBUMIN: 4 g/dL (ref 3.5–5.2)
ALT: 12 U/L (ref 0–35)
AST: 18 U/L (ref 0–37)
Alkaline Phosphatase: 102 U/L (ref 39–117)
Bilirubin, Direct: 0.1 mg/dL (ref 0.0–0.3)
Indirect Bilirubin: 0.4 mg/dL (ref 0.2–1.2)
TOTAL PROTEIN: 7 g/dL (ref 6.0–8.3)
Total Bilirubin: 0.5 mg/dL (ref 0.2–1.2)

## 2014-06-03 LAB — URINALYSIS, ROUTINE W REFLEX MICROSCOPIC
Glucose, UA: NEGATIVE mg/dL
HGB URINE DIPSTICK: NEGATIVE
LEUKOCYTES UA: NEGATIVE
NITRITE: NEGATIVE
Protein, ur: NEGATIVE mg/dL
Specific Gravity, Urine: 1.023 (ref 1.005–1.030)
UROBILINOGEN UA: 1 mg/dL (ref 0.0–1.0)
pH: 5.5 (ref 5.0–8.0)

## 2014-06-03 LAB — LIPASE: Lipase: 63 U/L (ref 0–75)

## 2014-06-03 LAB — AMYLASE: Amylase: 36 U/L (ref 0–105)

## 2014-06-04 ENCOUNTER — Other Ambulatory Visit: Payer: BLUE CROSS/BLUE SHIELD

## 2014-06-04 DIAGNOSIS — R8290 Unspecified abnormal findings in urine: Secondary | ICD-10-CM

## 2014-06-04 LAB — URINE CULTURE: Colony Count: 50000

## 2014-06-04 MED ORDER — OMEPRAZOLE 20 MG PO CPDR: 20.0000 mg | DELAYED_RELEASE_CAPSULE | Freq: Every day | ORAL | Status: DC

## 2014-06-04 NOTE — Progress Notes (Signed)
Patient returns today for recheck UC.  Unable to add onto labs from 06/02/14 ov, past 24 hr time.  Patient was given info about the BRAT diet and I discussed it with her daughter.  Also provided patient with samples of OTC Prilosec per Dewayne Shorter, PA-C orders to try once a day for 10 days and see if offers any relief with symptoms.

## 2014-06-06 LAB — URINE CULTURE: Colony Count: 40000

## 2014-06-08 ENCOUNTER — Other Ambulatory Visit: Payer: Self-pay | Admitting: *Deleted

## 2014-06-12 ENCOUNTER — Encounter: Payer: Self-pay | Admitting: *Deleted

## 2014-06-12 ENCOUNTER — Other Ambulatory Visit: Payer: Self-pay

## 2014-06-12 DIAGNOSIS — R112 Nausea with vomiting, unspecified: Secondary | ICD-10-CM

## 2014-06-12 DIAGNOSIS — R1084 Generalized abdominal pain: Secondary | ICD-10-CM

## 2014-06-24 ENCOUNTER — Other Ambulatory Visit: Payer: Self-pay | Admitting: Physician Assistant

## 2014-06-24 ENCOUNTER — Other Ambulatory Visit: Payer: Self-pay | Admitting: Internal Medicine

## 2014-06-29 ENCOUNTER — Encounter: Payer: Self-pay | Admitting: Internal Medicine

## 2014-06-29 ENCOUNTER — Telehealth: Payer: Self-pay | Admitting: *Deleted

## 2014-06-29 ENCOUNTER — Ambulatory Visit (INDEPENDENT_AMBULATORY_CARE_PROVIDER_SITE_OTHER): Payer: 59 | Admitting: Internal Medicine

## 2014-06-29 VITALS — BP 102/64 | HR 72 | Temp 98.1°F | Resp 16 | Ht 64.5 in | Wt 155.8 lb

## 2014-06-29 DIAGNOSIS — I10 Essential (primary) hypertension: Secondary | ICD-10-CM

## 2014-06-29 DIAGNOSIS — R109 Unspecified abdominal pain: Secondary | ICD-10-CM

## 2014-06-29 DIAGNOSIS — E559 Vitamin D deficiency, unspecified: Secondary | ICD-10-CM

## 2014-06-29 DIAGNOSIS — F0280 Dementia in other diseases classified elsewhere without behavioral disturbance: Secondary | ICD-10-CM

## 2014-06-29 DIAGNOSIS — R112 Nausea with vomiting, unspecified: Secondary | ICD-10-CM

## 2014-06-29 DIAGNOSIS — Z79899 Other long term (current) drug therapy: Secondary | ICD-10-CM

## 2014-06-29 DIAGNOSIS — E782 Mixed hyperlipidemia: Secondary | ICD-10-CM

## 2014-06-29 DIAGNOSIS — R634 Abnormal weight loss: Secondary | ICD-10-CM

## 2014-06-29 DIAGNOSIS — G301 Alzheimer's disease with late onset: Secondary | ICD-10-CM

## 2014-06-29 DIAGNOSIS — R7303 Prediabetes: Secondary | ICD-10-CM

## 2014-06-29 DIAGNOSIS — R7309 Other abnormal glucose: Secondary | ICD-10-CM

## 2014-06-29 LAB — CBC WITH DIFFERENTIAL/PLATELET
BASOS PCT: 1 % (ref 0–1)
Basophils Absolute: 0.1 10*3/uL (ref 0.0–0.1)
EOS ABS: 0.2 10*3/uL (ref 0.0–0.7)
EOS PCT: 2 % (ref 0–5)
HCT: 42.8 % (ref 36.0–46.0)
Hemoglobin: 14.5 g/dL (ref 12.0–15.0)
Lymphocytes Relative: 28 % (ref 12–46)
Lymphs Abs: 2.1 10*3/uL (ref 0.7–4.0)
MCH: 29.3 pg (ref 26.0–34.0)
MCHC: 33.9 g/dL (ref 30.0–36.0)
MCV: 86.5 fL (ref 78.0–100.0)
MONO ABS: 0.6 10*3/uL (ref 0.1–1.0)
MONOS PCT: 8 % (ref 3–12)
MPV: 11.1 fL (ref 9.4–12.4)
Neutro Abs: 4.6 10*3/uL (ref 1.7–7.7)
Neutrophils Relative %: 61 % (ref 43–77)
Platelets: 283 10*3/uL (ref 150–400)
RBC: 4.95 MIL/uL (ref 3.87–5.11)
RDW: 13.9 % (ref 11.5–15.5)
WBC: 7.6 10*3/uL (ref 4.0–10.5)

## 2014-06-29 MED ORDER — CLONAZEPAM 0.5 MG PO TBDP: 0.5000 mg | ORAL_TABLET | Freq: Two times a day (BID) | ORAL | Status: DC

## 2014-06-29 NOTE — Telephone Encounter (Signed)
Patient's spouse states [facility] did not receive the refill on Clonazepam that was sent in 06/24/2104.  [phone] confirmed the RX was not received.  Per Dr Melford Aase, [place] to recall to [facility].

## 2014-06-29 NOTE — Patient Instructions (Signed)

## 2014-06-29 NOTE — Progress Notes (Signed)
Subjective:    Patient ID: Helen Cabrera, female    DOB: Jan 04, 1956, 58 y.o.   MRN: [id number]  HPI  58 yo MWF with HTN, HLD, PreDM and SDAT represents for now ~ 6 week hx/o persistent Nausea, anorexia and query abdominal pain with a 10# weight loss over the last mont and 23# over the last 2 & 1/2 months.  Wt Readings from Last 3 Encounters:  06/29/14 155 lb 12.8 oz (70.67 kg)  06/02/14 165 lb (74.844 kg)  04/16/14 178 lb 6.4 oz (80.922 kg)     Pt's dementia makes symptom review difficult. Suspect she is having some generalized abdominal discomfort as well as some heartburn and possibly reflux. Labs from 1 month ago were normal and unrevealing and Abd U/S was also Negative. Does report dysgeusia and anorexia.  Medication Sig  . Acetaminophen 325 MG tablet Take 650 mg by mouth every 6 (six) hours as needed for mild pain, fever or headache.  Marland Kitchen aspirin 81 MG tablet Take 81 mg by mouth at bedtime.   Marland Kitchen atenolol (TENORMIN) 100 MG tablet Take 50 mg by mouth at bedtime.   Marland Kitchen atorvastatin (LIPITOR) 40 MG tablet Take 40 mg by mouth every Monday, Wednesday, and Friday. Takes at bedtime  . VITAMIN B-12 1000 MCG/ML injection Inject 1,000 mcg into the muscle every 14 (fourteen) days.  Marland Kitchen donepezil (ARICEPT) 10 MG tablet Take 10 mg by mouth at bedtime.  . Omega-3 Fatty Acids  Take 30 mLs by mouth at bedtime.  . Omeprazole 20 MG capsule Take 1 capsule (20 mg total) by mouth daily.  . ondansetron (ZOFRAN) 8 MG tablet Take 1 tablet (8 mg total) by mouth every 8 (eight) hours as needed for nausea or vomiting.  . Vitamin D 50,000 U Take 1 capsule daily or as directed for severe Vit D Deficiency  . clonazePAM (KLONOPIN) 0.5 MG tablet TAKE 1 TABLET TWICE A DAY AS NEEDED   Allergies  Allergen Reactions  . Amitriptyline Other (See Comments)    dysphoria  . Carafate [Sucralfate] Other (See Comments)    constipation  . Amitriptyline Other (See Comments)    dysphoria  . Carafate [Sucralfate] Other (See  Comments)    constipation  . Codeine Other (See Comments)    Insomnia; agitation; restlessness   . Lexapro [Escitalopram Oxalate] Other (See Comments)    Insomnia; agitation; restlessness   . Paxil [Paroxetine Hcl] Other (See Comments)    Insomnia; agitation; restlessness   . Prednisone Other (See Comments)    Unknown reaction  . Promethazine Nausea Only  . Zoloft [Sertraline Hcl] Other (See Comments)    Insomnia; agitation; restlessness   . Codeine Other (See Comments)    Insomnia; agitation; restlessness   . Lexapro [Escitalopram Oxalate] Other (See Comments)    Insomnia; agitation; restlessness   . Paxil [Paroxetine Hcl] Other (See Comments)    Insomnia; agitation; restlessness   . Prednisone Other (See Comments)    unknown  . Promethazine Hcl Nausea Only  . Zoloft [Sertraline Hcl] Other (See Comments)    Insomnia; agitation; restlessness    Past Medical History  Diagnosis Date  . Diverticulosis of colon (without mention of hemorrhage)   . IBS (irritable bowel syndrome)   . Unspecified gastritis and gastroduodenitis without mention of hemorrhage   . Family history of malignant neoplasm of gastrointestinal tract   . Hypercholesterolemia   . Chronic headaches   . Vitamin B12 deficiency   . Anemia     Iron  def   . Bacterial overgrowth syndrome   . Pancreatitis   . GERD (gastroesophageal reflux disease)   . Osteoarthritis   . Hypertension   . Depression   . Headache(784.0)   . Dementia    Past Surgical History  Procedure Laterality Date  . Cesarean section      x 4  . Abdominal hysterectomy    . Appendectomy    . Cesarean section      x4  . Pars plana vitrectomy Right 04/02/2014    Procedure: RIGHT PARS PLANA VITRECTOMY WITH 25 GAUGE/ENDO LASER/LENSECTOMY/INSERTION OF SILICONE OIL,REPAIR COMPLEX RETINAL DETACHMENT,MEMBRANE PEEL, IRIDECTOMY;  Surgeon: Hurman Horn, MD;  Location: [place];  Service: Ophthalmology;  Laterality: Right;   Review of Systems   Pertinent  as above.    Objective:   Physical Exam  BP 102/64 mmHg  Pulse 72  Temp(Src) 98.1 F (36.7 C)  Resp 16  Ht 5' 4.5" (1.638 m)  Wt 155 lb 12.8 oz (70.67 kg)  BMI 26.34 kg/m2  HEENT - Eac's patent. TM's Nl. EOM's full. PERRLA. NasoOroPharynx clear. Neck - supple. Nl Thyroid. Carotids 2+ & No bruits, nodes, JVD Chest - Clear equal BS w/o Rales, rhonchi, wheezes. Cor - Nl HS. RRR w/o sig MGR. PP 1(+). No edema. Abd - Soft, with no palpable organomegaly, masses or tenderness or guarding. BS nl. MS- FROM w/o deformities. Muscle power, tone and bulk Nl. Gait Nl. Neuro - No obvious Cr N abnormalities. Sensory, motor and Cerebellar functions appear Nl w/o focal abnormalities. Psyche - Mental status normal & appropriate.  No delusions, ideations or obvious mood abnormalities. Skin - clear.    Assessment & Plan:   1. Nausea and vomiting, vomiting of unspecified type  - Amylase - Helicobacter pylori abs-IgG+IgA, bld - CT Abdomen Pelvis W Contrast; Future  2. Essential hypertension  - TSH  3. Mixed hyperlipidemia  - Lipid panel  4. Prediabetes  - Hemoglobin A1c - Insulin, fasting  5. Vitamin D deficiency  - Vit D  25 hydroxy (rtn osteoporosis monitoring)  6. Loss of weight  - Amylase - Helicobacter pylori abs-IgG+IgA, bld - CT Abdomen Pelvis W Contrast; Future  7. Abdominal pain, unspecified abdominal location  - Amylase - Helicobacter pylori abs-IgG+IgA, bld - CT Abdomen Pelvis W Contrast; Future  8. SDAT (senile dementia of Alzheimer's type)  - Consider Sx's may be related to her Donepezil Med.  9. Medication management  - CBC with Differential - BASIC METABOLIC PANEL WITH GFR - Hepatic function panel - Magnesium

## 2014-06-30 LAB — HEPATIC FUNCTION PANEL
ALT: 12 U/L (ref 0–35)
AST: 18 U/L (ref 0–37)
Albumin: 4.2 g/dL (ref 3.5–5.2)
Alkaline Phosphatase: 84 U/L (ref 39–117)
Bilirubin, Direct: 0.1 mg/dL (ref 0.0–0.3)
Indirect Bilirubin: 0.5 mg/dL (ref 0.2–1.2)
TOTAL PROTEIN: 6.8 g/dL (ref 6.0–8.3)
Total Bilirubin: 0.6 mg/dL (ref 0.2–1.2)

## 2014-06-30 LAB — BASIC METABOLIC PANEL WITH GFR
BUN: 15 mg/dL (ref 6–23)
CALCIUM: 9.4 mg/dL (ref 8.4–10.5)
CO2: 31 mEq/L (ref 19–32)
Chloride: 102 mEq/L (ref 96–112)
Creat: 1.13 mg/dL — ABNORMAL HIGH (ref 0.50–1.10)
GFR, Est African American: 61 mL/min
GFR, Est Non African American: 53 mL/min — ABNORMAL LOW
GLUCOSE: 104 mg/dL — AB (ref 70–99)
Potassium: 4.4 mEq/L (ref 3.5–5.3)
Sodium: 141 mEq/L (ref 135–145)

## 2014-06-30 LAB — HEMOGLOBIN A1C
HEMOGLOBIN A1C: 5.8 % — AB (ref <5.7)
MEAN PLASMA GLUCOSE: 120 mg/dL — AB (ref <117)

## 2014-06-30 LAB — LIPID PANEL
Cholesterol: 167 mg/dL (ref 0–200)
HDL: 53 mg/dL (ref >39)
LDL Cholesterol: 85 mg/dL (ref 0–99)
Total CHOL/HDL Ratio: 3.2 Ratio
Triglycerides: 147 mg/dL (ref <150)
VLDL: 29 mg/dL (ref 0–40)

## 2014-06-30 LAB — TSH: TSH: 0.86 u[IU]/mL (ref 0.350–4.500)

## 2014-06-30 LAB — HELICOBACTER PYLORI ABS-IGG+IGA, BLD
H PYLORI IGG: 0.6 {ISR}
HELICOBACTER PYLORI AB, IGA: 5.8 U/mL (ref <9.0)

## 2014-06-30 LAB — INSULIN, FASTING: Insulin fasting, serum: 6.2 u[IU]/mL (ref 2.0–19.6)

## 2014-06-30 LAB — MAGNESIUM: MAGNESIUM: 2.3 mg/dL (ref 1.5–2.5)

## 2014-06-30 LAB — VITAMIN D 25 HYDROXY (VIT D DEFICIENCY, FRACTURES): Vit D, 25-Hydroxy: 69 ng/mL (ref 30–100)

## 2014-06-30 LAB — AMYLASE: AMYLASE: 33 U/L (ref 0–105)

## 2014-07-02 ENCOUNTER — Ambulatory Visit: Payer: Self-pay | Admitting: Physician Assistant

## 2014-07-06 ENCOUNTER — Ambulatory Visit (HOSPITAL_COMMUNITY)
Admission: RE | Admit: 2014-07-06 | Discharge: 2014-07-06 | Disposition: A | Discharge status: Home / Self Care | Payer: 59 | Source: Ambulatory Visit | Attending: Internal Medicine | Admitting: Internal Medicine

## 2014-07-06 DIAGNOSIS — R112 Nausea with vomiting, unspecified: Secondary | ICD-10-CM | POA: Diagnosis not present

## 2014-07-06 DIAGNOSIS — R634 Abnormal weight loss: Secondary | ICD-10-CM

## 2014-07-06 DIAGNOSIS — R109 Unspecified abdominal pain: Secondary | ICD-10-CM | POA: Insufficient documentation

## 2014-07-06 MED ORDER — IOHEXOL 300 MG/ML  SOLN
100.0000 mL | Freq: Once | INTRAMUSCULAR | Status: AC | PRN
  Administered 2014-07-06: 100 mL via INTRAVENOUS

## 2014-07-19 ENCOUNTER — Other Ambulatory Visit: Payer: Self-pay | Admitting: Internal Medicine

## 2014-07-20 ENCOUNTER — Other Ambulatory Visit: Payer: Self-pay | Admitting: Internal Medicine

## 2014-07-20 MED ORDER — MEMANTINE HCL 28 X 5 MG & 21 X 10 MG PO TABS: ORAL_TABLET | ORAL | Status: DC

## 2014-07-20 MED ORDER — MEMANTINE HCL 10 MG PO TABS: 10.0000 mg | ORAL_TABLET | Freq: Two times a day (BID) | ORAL | Status: DC

## 2014-07-20 MED ORDER — ATENOLOL 100 MG PO TABS: ORAL_TABLET | ORAL | Status: DC

## 2014-07-28 ENCOUNTER — Ambulatory Visit: Payer: Self-pay | Admitting: Physician Assistant

## 2014-08-25 ENCOUNTER — Other Ambulatory Visit: Payer: Self-pay | Admitting: Internal Medicine

## 2014-08-28 ENCOUNTER — Other Ambulatory Visit: Payer: Self-pay | Admitting: Internal Medicine

## 2014-08-28 MED ORDER — RIVASTIGMINE 4.6 MG/24HR TD PT24: MEDICATED_PATCH | TRANSDERMAL | Status: DC

## 2014-08-28 NOTE — Progress Notes (Signed)
Patient intolerant to Aricept and Namenda

## 2014-09-01 ENCOUNTER — Other Ambulatory Visit: Payer: Self-pay

## 2014-10-23 ENCOUNTER — Other Ambulatory Visit: Payer: Self-pay | Admitting: Internal Medicine

## 2014-10-26 DIAGNOSIS — H2512 Age-related nuclear cataract, left eye: Secondary | ICD-10-CM | POA: Diagnosis not present

## 2014-10-26 DIAGNOSIS — H33041 Retinal detachment with retinal dialysis, right eye: Secondary | ICD-10-CM | POA: Diagnosis not present

## 2014-10-26 DIAGNOSIS — H3531 Nonexudative age-related macular degeneration: Secondary | ICD-10-CM | POA: Diagnosis not present

## 2014-10-26 DIAGNOSIS — H02401 Unspecified ptosis of right eyelid: Secondary | ICD-10-CM | POA: Diagnosis not present

## 2014-10-28 ENCOUNTER — Encounter: Payer: Self-pay | Admitting: Internal Medicine

## 2014-10-28 ENCOUNTER — Ambulatory Visit (INDEPENDENT_AMBULATORY_CARE_PROVIDER_SITE_OTHER): Payer: 59 | Admitting: Internal Medicine

## 2014-10-28 VITALS — BP 104/68 | HR 68 | Temp 97.7°F | Resp 16 | Ht 64.5 in | Wt 149.8 lb

## 2014-10-28 DIAGNOSIS — G308 Other Alzheimer's disease: Secondary | ICD-10-CM

## 2014-10-28 DIAGNOSIS — R7309 Other abnormal glucose: Secondary | ICD-10-CM

## 2014-10-28 DIAGNOSIS — Z79899 Other long term (current) drug therapy: Secondary | ICD-10-CM

## 2014-10-28 DIAGNOSIS — F0280 Dementia in other diseases classified elsewhere without behavioral disturbance: Secondary | ICD-10-CM

## 2014-10-28 DIAGNOSIS — E559 Vitamin D deficiency, unspecified: Secondary | ICD-10-CM

## 2014-10-28 DIAGNOSIS — I10 Essential (primary) hypertension: Secondary | ICD-10-CM

## 2014-10-28 DIAGNOSIS — R7303 Prediabetes: Secondary | ICD-10-CM

## 2014-10-28 DIAGNOSIS — G301 Alzheimer's disease with late onset: Secondary | ICD-10-CM

## 2014-10-28 DIAGNOSIS — E785 Hyperlipidemia, unspecified: Secondary | ICD-10-CM

## 2014-10-28 DIAGNOSIS — R350 Frequency of micturition: Secondary | ICD-10-CM

## 2014-10-28 NOTE — Patient Instructions (Signed)
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
## 2014-10-29 LAB — CBC WITH DIFFERENTIAL/PLATELET
Basophils Absolute: 0.1 10*3/uL (ref 0.0–0.1)
Basophils Relative: 1 % (ref 0–1)
Eosinophils Absolute: 0.2 10*3/uL (ref 0.0–0.7)
Eosinophils Relative: 3 % (ref 0–5)
HCT: 38.6 % (ref 36.0–46.0)
Hemoglobin: 12.8 g/dL (ref 12.0–15.0)
LYMPHS PCT: 28 % (ref 12–46)
Lymphs Abs: 2 10*3/uL (ref 0.7–4.0)
MCH: 28.8 pg (ref 26.0–34.0)
MCHC: 33.2 g/dL (ref 30.0–36.0)
MCV: 86.9 fL (ref 78.0–100.0)
MONO ABS: 0.6 10*3/uL (ref 0.1–1.0)
MONOS PCT: 8 % (ref 3–12)
MPV: 10.2 fL (ref 8.6–12.4)
NEUTROS ABS: 4.3 10*3/uL (ref 1.7–7.7)
Neutrophils Relative %: 60 % (ref 43–77)
Platelets: 332 10*3/uL (ref 150–400)
RBC: 4.44 MIL/uL (ref 3.87–5.11)
RDW: 13 % (ref 11.5–15.5)
WBC: 7.1 10*3/uL (ref 4.0–10.5)

## 2014-10-29 LAB — LIPID PANEL
Cholesterol: 166 mg/dL (ref 0–200)
HDL: 52 mg/dL (ref >=46)
LDL CALC: 91 mg/dL (ref 0–99)
TRIGLYCERIDES: 114 mg/dL (ref <150)
Total CHOL/HDL Ratio: 3.2 Ratio
VLDL: 23 mg/dL (ref 0–40)

## 2014-10-29 LAB — URINALYSIS, MICROSCOPIC ONLY
Bacteria, UA: NONE SEEN
Casts: NONE SEEN
Crystals: NONE SEEN
Squamous Epithelial / LPF: NONE SEEN

## 2014-10-29 LAB — HEPATIC FUNCTION PANEL
ALT: 11 U/L (ref 0–35)
AST: 13 U/L (ref 0–37)
Albumin: 3.6 g/dL (ref 3.5–5.2)
Alkaline Phosphatase: 85 U/L (ref 39–117)
Bilirubin, Direct: <0.1 mg/dL (ref 0.0–0.3)
Total Bilirubin: 0.3 mg/dL (ref 0.2–1.2)
Total Protein: 6.2 g/dL (ref 6.0–8.3)

## 2014-10-29 LAB — BASIC METABOLIC PANEL WITH GFR
BUN: 23 mg/dL (ref 6–23)
CALCIUM: 8.6 mg/dL (ref 8.4–10.5)
CO2: 29 meq/L (ref 19–32)
CREATININE: 0.84 mg/dL (ref 0.50–1.10)
Chloride: 103 mEq/L (ref 96–112)
GFR, EST NON AFRICAN AMERICAN: 76 mL/min
GFR, Est African American: 88 mL/min
GLUCOSE: 95 mg/dL (ref 70–99)
Potassium: 4.1 mEq/L (ref 3.5–5.3)
Sodium: 139 mEq/L (ref 135–145)

## 2014-10-29 LAB — HEMOGLOBIN A1C
Hgb A1c MFr Bld: 5.9 % — ABNORMAL HIGH (ref <5.7)
MEAN PLASMA GLUCOSE: 123 mg/dL — AB (ref <117)

## 2014-10-29 LAB — INSULIN, FASTING: INSULIN FASTING, SERUM: 7.4 u[IU]/mL (ref 2.0–19.6)

## 2014-10-29 LAB — URINALYSIS, ROUTINE W REFLEX MICROSCOPIC
BILIRUBIN URINE: NEGATIVE
GLUCOSE, UA: NEGATIVE mg/dL
HGB URINE DIPSTICK: NEGATIVE
Ketones, ur: NEGATIVE mg/dL
Nitrite: NEGATIVE
Protein, ur: NEGATIVE mg/dL
Specific Gravity, Urine: 1.019 (ref 1.005–1.030)
Urobilinogen, UA: 0.2 mg/dL (ref 0.0–1.0)
pH: 6 (ref 5.0–8.0)

## 2014-10-29 LAB — TSH: TSH: 1.111 u[IU]/mL (ref 0.350–4.500)

## 2014-10-29 LAB — MAGNESIUM: Magnesium: 2 mg/dL (ref 1.5–2.5)

## 2014-10-29 LAB — VITAMIN D 25 HYDROXY (VIT D DEFICIENCY, FRACTURES): Vit D, 25-Hydroxy: 55 ng/mL (ref 30–100)

## 2014-10-30 LAB — URINE CULTURE

## 2014-11-02 ENCOUNTER — Other Ambulatory Visit: Payer: Self-pay | Admitting: *Deleted

## 2014-11-02 MED ORDER — CEPHALEXIN 500 MG PO CAPS: 500.0000 mg | ORAL_CAPSULE | Freq: Three times a day (TID) | ORAL | Status: AC

## 2014-11-20 ENCOUNTER — Other Ambulatory Visit: Payer: Self-pay | Admitting: Internal Medicine

## 2014-11-30 DIAGNOSIS — H25812 Combined forms of age-related cataract, left eye: Secondary | ICD-10-CM | POA: Diagnosis not present

## 2014-11-30 DIAGNOSIS — H2512 Age-related nuclear cataract, left eye: Secondary | ICD-10-CM | POA: Diagnosis not present

## 2014-12-03 ENCOUNTER — Ambulatory Visit (INDEPENDENT_AMBULATORY_CARE_PROVIDER_SITE_OTHER): Payer: 59 | Admitting: *Deleted

## 2014-12-03 DIAGNOSIS — R300 Dysuria: Secondary | ICD-10-CM

## 2014-12-03 NOTE — Progress Notes (Signed)
Patient ID: Helen Cabrera, female   DOB: 1956-02-14, 59 y.o.   MRN: [id number] Patient presents for recheck UA, C&S.  Husband is concerned about patient's BS levels.  Patient had eye surgery 11/30/14 and states her BS level was checked prior to surgery and was 113, after surgery was >300.  Husband states they kept patient for an additional 30 min and BS dropped enough to discharge patient.  BS was checked in office today and was 72.  Patient last ate about 4 hours ago.  Husband spoke in length with Starlyn Skeans, PA-C and she explained BS spike could have been d/t meds given to patient to go to sleep.  Husband will continue to monitor BS levels and will call with any concerns.

## 2014-12-05 ENCOUNTER — Other Ambulatory Visit: Payer: Self-pay | Admitting: Emergency Medicine

## 2014-12-05 LAB — URINALYSIS, ROUTINE W REFLEX MICROSCOPIC
Bilirubin Urine: NEGATIVE
Glucose, UA: NEGATIVE mg/dL
Hgb urine dipstick: NEGATIVE
Ketones, ur: NEGATIVE mg/dL
Leukocytes, UA: NEGATIVE
Nitrite: NEGATIVE
Protein, ur: NEGATIVE mg/dL
Specific Gravity, Urine: <1.005 — ABNORMAL LOW (ref 1.005–1.030)
UROBILINOGEN UA: 0.2 mg/dL (ref 0.0–1.0)
pH: 5.5 (ref 5.0–8.0)

## 2014-12-06 LAB — URINE CULTURE: Colony Count: 40000

## 2014-12-31 DIAGNOSIS — H3322 Serous retinal detachment, left eye: Secondary | ICD-10-CM | POA: Diagnosis not present

## 2014-12-31 DIAGNOSIS — G308 Other Alzheimer's disease: Secondary | ICD-10-CM | POA: Diagnosis not present

## 2014-12-31 DIAGNOSIS — Z961 Presence of intraocular lens: Secondary | ICD-10-CM | POA: Diagnosis not present

## 2015-02-01 ENCOUNTER — Encounter: Payer: Self-pay | Admitting: Physician Assistant

## 2015-02-01 ENCOUNTER — Ambulatory Visit (INDEPENDENT_AMBULATORY_CARE_PROVIDER_SITE_OTHER): Payer: BLUE CROSS/BLUE SHIELD | Admitting: Physician Assistant

## 2015-02-01 VITALS — BP 102/68 | HR 80 | Temp 97.7°F | Resp 16 | Ht 64.5 in | Wt 153.0 lb

## 2015-02-01 DIAGNOSIS — R8290 Unspecified abnormal findings in urine: Secondary | ICD-10-CM | POA: Diagnosis not present

## 2015-02-01 DIAGNOSIS — Z79899 Other long term (current) drug therapy: Secondary | ICD-10-CM

## 2015-02-01 DIAGNOSIS — R7303 Prediabetes: Secondary | ICD-10-CM

## 2015-02-01 DIAGNOSIS — R8299 Other abnormal findings in urine: Secondary | ICD-10-CM | POA: Diagnosis not present

## 2015-02-01 DIAGNOSIS — R7309 Other abnormal glucose: Secondary | ICD-10-CM | POA: Diagnosis not present

## 2015-02-01 DIAGNOSIS — E559 Vitamin D deficiency, unspecified: Secondary | ICD-10-CM | POA: Diagnosis not present

## 2015-02-01 DIAGNOSIS — G301 Alzheimer's disease with late onset: Secondary | ICD-10-CM

## 2015-02-01 DIAGNOSIS — I10 Essential (primary) hypertension: Secondary | ICD-10-CM

## 2015-02-01 DIAGNOSIS — E782 Mixed hyperlipidemia: Secondary | ICD-10-CM

## 2015-02-01 DIAGNOSIS — F0280 Dementia in other diseases classified elsewhere without behavioral disturbance: Secondary | ICD-10-CM

## 2015-02-01 LAB — CBC WITH DIFFERENTIAL/PLATELET
Basophils Absolute: 0.1 10*3/uL (ref 0.0–0.1)
Basophils Relative: 1 % (ref 0–1)
Eosinophils Absolute: 0.2 10*3/uL (ref 0.0–0.7)
Eosinophils Relative: 2 % (ref 0–5)
HEMATOCRIT: 40.5 % (ref 36.0–46.0)
Hemoglobin: 13.2 g/dL (ref 12.0–15.0)
LYMPHS ABS: 2 10*3/uL (ref 0.7–4.0)
LYMPHS PCT: 19 % (ref 12–46)
MCH: 28.9 pg (ref 26.0–34.0)
MCHC: 32.6 g/dL (ref 30.0–36.0)
MCV: 88.6 fL (ref 78.0–100.0)
MONOS PCT: 7 % (ref 3–12)
MPV: 10.2 fL (ref 8.6–12.4)
Monocytes Absolute: 0.7 10*3/uL (ref 0.1–1.0)
NEUTROS ABS: 7.4 10*3/uL (ref 1.7–7.7)
Neutrophils Relative %: 71 % (ref 43–77)
Platelets: 328 10*3/uL (ref 150–400)
RBC: 4.57 MIL/uL (ref 3.87–5.11)
RDW: 13.5 % (ref 11.5–15.5)
WBC: 10.4 10*3/uL (ref 4.0–10.5)

## 2015-02-01 NOTE — Patient Instructions (Signed)
    Bad carbs also include fruit juice, alcohol, and sweet tea. These are empty calories that do not signal to your brain that you are full.   Please remember the good carbs are still carbs which convert into sugar. So please measure them out no more than 1/2-1 cup of rice, oatmeal, pasta, and beans  Veggies are however free foods! Pile them on.   Not all fruit is created equal. Please see the list below, the fruit at the bottom is higher in sugars than the fruit at the top. Please avoid all dried fruits.     Management of Memory Problems  There are some general things you can do to help manage your memory problems.  Your memory may not in fact recover, but by using techniques and strategies you will be able to manage your memory difficulties better.  1)  Establish a routine.  Try to establish and then stick to a regular routine.  By doing this, you will get used to what to expect and you will reduce the need to rely on your memory.  Also, try to do things at the same time of day, such as taking your medication or checking your calendar first thing in the morning.  Think about think that you can do as a part of a regular routine and make a list.  Then enter them into a daily planner to remind you.  This will help you establish a routine.  2)  Organize your environment.  Organize your environment so that it is uncluttered.  Decrease visual stimulation.  Place everyday items such as keys or cell phone in the same place every day (ie.  Basket next to front door)  Use post it notes with a brief message to yourself (ie. Turn off light, lock the door)  Use labels to indicate where things go (ie. Which cupboards are for food, dishes, etc.)  Keep a notepad and pen by the telephone to take messages  3)  Memory Aids  A diary or journal/notebook/daily planner  Making a list (shopping list, chore list, to do list that needs to be done)  Using an alarm as a reminder (kitchen timer or cell  phone alarm)  Using cell phone to store information (Notes, Calendar, Reminders)  Calendar/White board placed in a prominent position  Post-it notes  In order for memory aids to be useful, you need to have good habits.  It's no good remembering to make a note in your journal if you don't remember to look in it.  Try setting aside a certain time of day to look in journal.  4)  Improving mood and managing fatigue.  There may be other factors that contribute to memory difficulties.  Factors, such as anxiety, depression and tiredness can affect memory.  Regular gentle exercise can help improve your mood and give you more energy.  Simple relaxation techniques may help relieve symptoms of anxiety  Try to get back to completing activities or hobbies you enjoyed doing in the past.  Learn to pace yourself through activities to decrease fatigue.  Find out about some local support groups where you can share experiences with others.  Try and achieve 7-8 hours of sleep at night.
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
## 2015-02-01 NOTE — Addendum Note (Signed)
Addended by: Vicie Mutters R on: 02/01/2015 05:05 PM   Modules accepted: Orders

## 2015-02-02 ENCOUNTER — Other Ambulatory Visit: Payer: Self-pay | Admitting: *Deleted

## 2015-02-02 DIAGNOSIS — R8290 Unspecified abnormal findings in urine: Secondary | ICD-10-CM | POA: Diagnosis not present

## 2015-02-02 DIAGNOSIS — R8299 Other abnormal findings in urine: Secondary | ICD-10-CM | POA: Diagnosis not present

## 2015-02-02 LAB — BASIC METABOLIC PANEL WITH GFR
BUN: 17 mg/dL (ref 6–23)
CALCIUM: 8.9 mg/dL (ref 8.4–10.5)
CO2: 29 meq/L (ref 19–32)
Chloride: 102 mEq/L (ref 96–112)
Creat: 1.15 mg/dL — ABNORMAL HIGH (ref 0.50–1.10)
GFR, Est African American: 60 mL/min
GFR, Est Non African American: 52 mL/min — ABNORMAL LOW
Glucose, Bld: 122 mg/dL — ABNORMAL HIGH (ref 70–99)
POTASSIUM: 4.4 meq/L (ref 3.5–5.3)
SODIUM: 144 meq/L (ref 135–145)

## 2015-02-02 LAB — HEPATIC FUNCTION PANEL
ALT: 14 U/L (ref 0–35)
AST: 14 U/L (ref 0–37)
Albumin: 3.7 g/dL (ref 3.5–5.2)
Alkaline Phosphatase: 99 U/L (ref 39–117)
BILIRUBIN DIRECT: 0.1 mg/dL (ref 0.0–0.3)
BILIRUBIN INDIRECT: 0.2 mg/dL (ref 0.2–1.2)
TOTAL PROTEIN: 6.4 g/dL (ref 6.0–8.3)
Total Bilirubin: 0.3 mg/dL (ref 0.2–1.2)

## 2015-02-02 LAB — LIPID PANEL
Cholesterol: 161 mg/dL (ref 0–200)
HDL: 60 mg/dL (ref >=46)
LDL Cholesterol: 71 mg/dL (ref 0–99)
TRIGLYCERIDES: 150 mg/dL — AB (ref <150)
Total CHOL/HDL Ratio: 2.7 Ratio
VLDL: 30 mg/dL (ref 0–40)

## 2015-02-02 LAB — HEMOGLOBIN A1C
Hgb A1c MFr Bld: 5.9 % — ABNORMAL HIGH (ref <5.7)
Mean Plasma Glucose: 123 mg/dL — ABNORMAL HIGH (ref <117)

## 2015-02-02 LAB — VITAMIN D 25 HYDROXY (VIT D DEFICIENCY, FRACTURES): Vit D, 25-Hydroxy: 67 ng/mL (ref 30–100)

## 2015-02-02 LAB — TSH: TSH: 0.852 u[IU]/mL (ref 0.350–4.500)

## 2015-02-02 LAB — MAGNESIUM: Magnesium: 2.1 mg/dL (ref 1.5–2.5)

## 2015-02-02 NOTE — Addendum Note (Signed)
Addended by: Vicie Mutters R on: 02/02/2015 08:15 AM   Modules accepted: Orders

## 2015-02-03 LAB — URINALYSIS, ROUTINE W REFLEX MICROSCOPIC
Bilirubin Urine: NEGATIVE
GLUCOSE, UA: NEGATIVE mg/dL
Hgb urine dipstick: NEGATIVE
Ketones, ur: NEGATIVE mg/dL
LEUKOCYTES UA: NEGATIVE
Nitrite: NEGATIVE
Protein, ur: NEGATIVE mg/dL
SPECIFIC GRAVITY, URINE: 1.012 (ref 1.005–1.030)
Urobilinogen, UA: 0.2 mg/dL (ref 0.0–1.0)
pH: 7.5 (ref 5.0–8.0)

## 2015-02-04 LAB — URINE CULTURE: Colony Count: 25000

## 2015-03-11 DIAGNOSIS — Z961 Presence of intraocular lens: Secondary | ICD-10-CM | POA: Diagnosis not present

## 2015-03-11 DIAGNOSIS — H43822 Vitreomacular adhesion, left eye: Secondary | ICD-10-CM | POA: Diagnosis not present

## 2015-03-11 DIAGNOSIS — H3322 Serous retinal detachment, left eye: Secondary | ICD-10-CM | POA: Diagnosis not present

## 2015-03-22 ENCOUNTER — Other Ambulatory Visit: Payer: Self-pay | Admitting: *Deleted

## 2015-03-22 MED ORDER — ATORVASTATIN CALCIUM 40 MG PO TABS: 40.0000 mg | ORAL_TABLET | Freq: Every day | ORAL | Status: DC
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
## 2015-04-20 ENCOUNTER — Encounter: Payer: Self-pay | Admitting: Internal Medicine

## 2015-04-28 DIAGNOSIS — I10 Essential (primary) hypertension: Secondary | ICD-10-CM

## 2015-05-13 ENCOUNTER — Encounter: Payer: Medicare Other | Admitting: Internal Medicine

## 2015-05-30 ENCOUNTER — Other Ambulatory Visit: Payer: Self-pay | Admitting: Internal Medicine

## 2015-07-22 ENCOUNTER — Encounter: Payer: Self-pay | Admitting: Internal Medicine

## 2015-07-22 ENCOUNTER — Ambulatory Visit (INDEPENDENT_AMBULATORY_CARE_PROVIDER_SITE_OTHER): Payer: Medicare Other | Admitting: Internal Medicine

## 2015-07-22 VITALS — BP 126/84 | HR 64 | Temp 97.5°F | Resp 16 | Ht 65.0 in | Wt 167.8 lb

## 2015-07-22 DIAGNOSIS — G301 Alzheimer's disease with late onset: Secondary | ICD-10-CM

## 2015-07-22 DIAGNOSIS — E782 Mixed hyperlipidemia: Secondary | ICD-10-CM | POA: Diagnosis not present

## 2015-07-22 DIAGNOSIS — Z0001 Encounter for general adult medical examination with abnormal findings: Secondary | ICD-10-CM

## 2015-07-22 DIAGNOSIS — Z0000 Encounter for general adult medical examination without abnormal findings: Secondary | ICD-10-CM | POA: Diagnosis not present

## 2015-07-22 DIAGNOSIS — Z79899 Other long term (current) drug therapy: Secondary | ICD-10-CM | POA: Diagnosis not present

## 2015-07-22 DIAGNOSIS — R7303 Prediabetes: Secondary | ICD-10-CM | POA: Diagnosis not present

## 2015-07-22 DIAGNOSIS — E559 Vitamin D deficiency, unspecified: Secondary | ICD-10-CM

## 2015-07-22 DIAGNOSIS — E538 Deficiency of other specified B group vitamins: Secondary | ICD-10-CM | POA: Diagnosis not present

## 2015-07-22 DIAGNOSIS — G308 Other Alzheimer's disease: Secondary | ICD-10-CM | POA: Diagnosis not present

## 2015-07-22 DIAGNOSIS — Z1212 Encounter for screening for malignant neoplasm of rectum: Secondary | ICD-10-CM

## 2015-07-22 DIAGNOSIS — R6889 Other general symptoms and signs: Secondary | ICD-10-CM | POA: Diagnosis not present

## 2015-07-22 DIAGNOSIS — I10 Essential (primary) hypertension: Secondary | ICD-10-CM

## 2015-07-22 DIAGNOSIS — K219 Gastro-esophageal reflux disease without esophagitis: Secondary | ICD-10-CM | POA: Diagnosis not present

## 2015-07-22 DIAGNOSIS — F0280 Dementia in other diseases classified elsewhere without behavioral disturbance: Secondary | ICD-10-CM

## 2015-07-22 DIAGNOSIS — R5383 Other fatigue: Secondary | ICD-10-CM

## 2015-07-22 DIAGNOSIS — R7309 Other abnormal glucose: Secondary | ICD-10-CM | POA: Diagnosis not present

## 2015-07-22 LAB — CBC WITH DIFFERENTIAL/PLATELET
Basophils Absolute: 0.1 10*3/uL (ref 0.0–0.1)
Basophils Relative: 1 % (ref 0–1)
EOS PCT: 3 % (ref 0–5)
Eosinophils Absolute: 0.2 10*3/uL (ref 0.0–0.7)
HEMATOCRIT: 39.1 % (ref 36.0–46.0)
Hemoglobin: 13.3 g/dL (ref 12.0–15.0)
LYMPHS PCT: 29 % (ref 12–46)
Lymphs Abs: 2 10*3/uL (ref 0.7–4.0)
MCH: 29.5 pg (ref 26.0–34.0)
MCHC: 34 g/dL (ref 30.0–36.0)
MCV: 86.7 fL (ref 78.0–100.0)
MONO ABS: 0.6 10*3/uL (ref 0.1–1.0)
MPV: 10.5 fL (ref 8.6–12.4)
Monocytes Relative: 9 % (ref 3–12)
Neutro Abs: 4 10*3/uL (ref 1.7–7.7)
Neutrophils Relative %: 58 % (ref 43–77)
Platelets: 271 10*3/uL (ref 150–400)
RBC: 4.51 MIL/uL (ref 3.87–5.11)
RDW: 13.8 % (ref 11.5–15.5)
WBC: 6.9 10*3/uL (ref 4.0–10.5)

## 2015-07-22 NOTE — Patient Instructions (Addendum)
Recommend Adult Low Dose Aspirin or   coated  Aspirin 81 mg daily   To reduce risk of Colon Cancer 20 %,   Skin Cancer 26 % ,   Melanoma 46%   and   Pancreatic cancer 60%   ++++++++++++++++++++++++++++++++++++++++++++++++++++++  Vitamin D goal   is between 70-100.   Please make sure that you are taking your Vitamin D as directed.   It is very important as a natural anti-inflammatory   helping hair, skin, and nails, as well as reducing stroke and heart attack risk.   It helps your bones and helps with mood.  It also decreases numerous cancer risks so please take it as directed.   Low Vit D is associated with a 200-300% higher risk for CANCER   and 200-300% higher risk for HEART   ATTACK  &  STROKE.   ......................................  It is also associated with higher death rate at younger ages,   autoimmune diseases like Rheumatoid arthritis, Lupus, Multiple Sclerosis.     Also many other serious conditions, like depression, Alzheimer's  Dementia, infertility, muscle aches, fatigue, fibromyalgia - just to name a few.  ++++++++++++++++++++++++++++++++++++++++++++++++  Recommend the book "The END of DIETING" by Dr Joel Fuhrman   & the book "The END of DIABETES " by Dr Joel Fuhrman  At [employer] - get book & Audio CD's     Being diabetic has a  300% increased risk for heart attack, stroke, cancer, and alzheimer- type vascular dementia. It is very important that you work harder with diet by avoiding all foods that are white. Avoid white rice (brown & wild rice is OK), white potatoes (sweetpotatoes in moderation is OK), White bread or wheat bread or anything made out of white flour like bagels, donuts, rolls, buns, biscuits, cakes, pastries, cookies, pizza crust, and pasta (made from white flour & egg whites) - vegetarian pasta or spinach or wheat pasta is OK. Multigrain breads like Arnold's or [employer], or multigrain sandwich thins or flatbreads.  Diet,  exercise and weight loss can reverse and cure diabetes in the early stages.  Diet, exercise and weight loss is very important in the control and prevention of complications of diabetes which affects every system in your body, ie. Brain - dementia/stroke, eyes - glaucoma/blindness, heart - heart attack/heart failure, kidneys - dialysis, stomach - gastric paralysis, intestines - malabsorption, nerves - severe painful neuritis, circulation - gangrene & loss of a leg(s), and finally cancer and Alzheimers.    I recommend avoid fried & greasy foods,  sweets/candy, white rice (brown or wild rice or Quinoa is OK), white potatoes (sweet potatoes are OK) - anything made from white flour - bagels, doughnuts, rolls, buns, biscuits,white and wheat breads, pizza crust and traditional pasta made of white flour & egg white(vegetarian pasta or spinach or wheat pasta is OK).  Multi-grain bread is OK - like multi-grain flat bread or sandwich thins. Avoid alcohol in excess. Exercise is also important.    Eat all the vegetables you want - avoid meat, especially red meat and dairy - especially cheese.  Cheese is the most concentrated form of trans-fats which is the worst thing to clog up our arteries. Veggie cheese is OK which can be found in the fresh produce section at [employer] or [employer] or [employer]  ++++++++++++++++++++++++++++++++++++++++++++++++++ DASH Eating Plan  DASH stands for "Dietary Approaches to Stop Hypertension."   The DASH eating plan is a healthy eating plan that has been shown to reduce high   blood pressure (hypertension). Additional health benefits may include reducing the risk of type 2 diabetes mellitus, heart disease, and stroke. The DASH eating plan may also help with weight loss.  WHAT DO I NEED TO KNOW ABOUT THE DASH EATING PLAN? For the DASH eating plan, you will follow these general guidelines:  Choose foods with a percent daily value for sodium of less than 5% (as listed on the food  label).  Use salt-free seasonings or herbs instead of table salt or sea salt.  Check with your health care provider or pharmacist before using salt substitutes.  Eat lower-sodium products, often labeled as "lower sodium" or "no salt added."  Eat fresh foods.  Eat more vegetables, fruits, and low-fat dairy products.    Choose whole grains. Look for the word "whole" as the first word in the ingredient list.  Choose fish   Limit sweets, desserts, sugars, and sugary drinks.  Choose heart-healthy fats.  Eat veggie cheese   Eat more home-cooked food and less restaurant, buffet, and fast food.  Limit fried foods.  Cook foods using methods other than frying.  Limit canned vegetables. If you do use them, rinse them well to decrease the sodium.  When eating at a restaurant, ask that your food be prepared with less salt, or no salt if possible.                      WHAT FOODS CAN I EAT?  Seek help from a dietitian for individual calorie needs.  Grains Whole grain or whole wheat bread. Brown rice. Whole grain or whole wheat pasta. Quinoa, bulgur, and whole grain cereals. Low-sodium cereals. Corn or whole wheat flour tortillas. Whole grain cornbread. Whole grain crackers. Low-sodium crackers.  Vegetables Fresh or frozen vegetables (raw, steamed, roasted, or grilled). Low-sodium or reduced-sodium tomato and vegetable juices. Low-sodium or reduced-sodium tomato sauce and paste. Low-sodium or reduced-sodium canned vegetables.   Fruits All fresh, canned (in natural juice), or frozen fruits.  Protein Products  All fish and seafood.  Dried beans, peas, or lentils. Unsalted nuts and seeds. Unsalted canned beans.  Dairy Low-fat dairy products, such as skim or 1% milk, 2% or reduced-fat cheeses, low-fat ricotta or cottage cheese, or plain low-fat yogurt. Low-sodium or reduced-sodium cheeses.  Fats and Oils Tub margarines without trans fats. Light or reduced-fat mayonnaise and salad  dressings (reduced sodium). Avocado. Safflower, olive, or canola oils. Natural peanut or almond butter.  Other Unsalted popcorn and pretzels. The items listed above may not be a complete list of recommended foods or beverages. Contact your dietitian for more options.  +++++++++++++++++++++++++++++++++++++++++++  WHAT FOODS ARE NOT RECOMMENDED?  Grains/ White flour or wheat flour  White bread. White pasta. White rice. Refined cornbread. Bagels and croissants. Crackers that contain trans fat.  Vegetables  Creamed or fried vegetables. Vegetables in a . Regular canned vegetables. Regular canned tomato sauce and paste. Regular tomato and vegetable juices.  Fruits Dried fruits. Canned fruit in light or heavy syrup. Fruit juice.  Meat and Other Protein Products Meat in general. Fatty cuts of meat. Ribs, chicken wings, bacon, sausage, bologna, salami, chitterlings, fatback, hot dogs, bratwurst, and packaged luncheon meats.  Dairy Whole or 2% milk, cream, half-and-half, and cream cheese. Whole-fat or sweetened yogurt. Full-fat cheeses or blue cheese. Nondairy creamers and whipped toppings. Processed cheese, cheese spreads, or cheese curds.  Condiments Onion and garlic salt, seasoned salt, table salt, and sea salt. Canned and packaged gravies. Worcestershire sauce. Tartar sauce.  Barbecue sauce. Teriyaki sauce. Soy sauce, including reduced sodium. Steak sauce. Fish sauce. Oyster sauce. Cocktail sauce. Horseradish. Ketchup and mustard. Meat flavorings and tenderizers. Bouillon cubes. Hot sauce. Tabasco sauce. Marinades. Taco seasonings. Relishes.  Fats and Oils Butter, stick margarine, lard, shortening, ghee, and bacon fat. Coconut, palm kernel, or palm oils. Regular salad dressings.  Pickles and olives. Salted popcorn and pretzels. The items listed above may not be a complete list of foods and beverages to avoid.   Preventive Care for Adults  A healthy lifestyle and preventive care can  promote health and wellness. Preventive health guidelines for women include the following key practices.  A routine yearly physical is a good way to check with your health care provider about your health and preventive screening. It is a chance to share any concerns and updates on your health and to receive a thorough exam.  Visit your dentist for a routine exam and preventive care every 6 months. Brush your teeth twice a day and floss once a day. Good oral hygiene prevents tooth decay and gum disease.  The frequency of eye exams is based on your age, health, family medical history, use of contact lenses, and other factors. Follow your health care provider's recommendations for frequency of eye exams.  Eat a healthy diet. Foods like vegetables, fruits, whole grains, low-fat dairy products, and lean protein foods contain the nutrients you need without too many calories. Decrease your intake of foods high in solid fats, added sugars, and salt. Eat the right amount of calories for you.Get information about a proper diet from your health care provider, if necessary.  Regular physical exercise is one of the most important things you can do for your health. Most adults should get at least 150 minutes of moderate-intensity exercise (any activity that increases your heart rate and causes you to sweat) each week. In addition, most adults need muscle-strengthening exercises on 2 or more days a week.  Maintain a healthy weight. The body mass index (BMI) is a screening tool to identify possible weight problems. It provides an estimate of body fat based on height and weight. Your health care provider can find your BMI and can help you achieve or maintain a healthy weight.For adults 20 years and older:  A BMI below 18.5 is considered underweight.  A BMI of 18.5 to 24.9 is normal.  A BMI of 25 to 29.9 is considered overweight.  A BMI of 30 and above is considered obese.  Maintain normal blood lipids and  cholesterol levels by exercising and minimizing your intake of saturated fat. Eat a balanced diet with plenty of fruit and vegetables. Blood tests for lipids and cholesterol should begin at age 31 and be repeated every 5 years. If your lipid or cholesterol levels are high, you are over 50, or you are at high risk for heart disease, you may need your cholesterol levels checked more frequently.Ongoing high lipid and cholesterol levels should be treated with medicines if diet and exercise are not working.  If you smoke, find out from your health care provider how to quit. If you do not use tobacco, do not start.  Lung cancer screening is recommended for adults aged 83-80 years who are at high risk for developing lung cancer because of a history of smoking. A yearly low-dose CT scan of the lungs is recommended for people who have at least a 30-pack-year history of smoking and are a current smoker or have quit within the past 15 years.  A pack year of smoking is smoking an average of 1 pack of cigarettes a day for 1 year (for example: 1 pack a day for 30 years or 2 packs a day for 15 years). Yearly screening should continue until the smoker has stopped smoking for at least 15 years. Yearly screening should be stopped for people who develop a health problem that would prevent them from having lung cancer treatment.  High blood pressure causes heart disease and increases the risk of stroke. Your blood pressure should be checked at least every 1 to 2 years. Ongoing high blood pressure should be treated with medicines if weight loss and exercise do not work.  If you are 61-30 years old, ask your health care provider if you should take aspirin to prevent strokes.  Diabetes screening involves taking a blood sample to check your fasting blood sugar level. This should be done once every 3 years, after age 9, if you are within normal weight and without risk factors for diabetes. Testing should be considered at a  younger age or be carried out more frequently if you are overweight and have at least 1 risk factor for diabetes.  Breast cancer screening is essential preventive care for women. You should practice "breast self-awareness." This means understanding the normal appearance and feel of your breasts and may include breast self-examination. Any changes detected, no matter how small, should be reported to a health care provider. Women in their 74s and 30s should have a clinical breast exam (CBE) by a health care provider as part of a regular health exam every 1 to 3 years. After age 75, women should have a CBE every year. Starting at age 19, women should consider having a mammogram (breast X-ray test) every year. Women who have a family history of breast cancer should talk to their health care provider about genetic screening. Women at a high risk of breast cancer should talk to their health care providers about having an MRI and a mammogram every year.  Breast cancer gene (BRCA)-related cancer risk assessment is recommended for women who have family members with BRCA-related cancers. BRCA-related cancers include breast, ovarian, tubal, and peritoneal cancers. Having family members with these cancers may be associated with an increased risk for harmful changes (mutations) in the breast cancer genes BRCA1 and BRCA2. Results of the assessment will determine the need for genetic counseling and BRCA1 and BRCA2 testing.  Routine pelvic exams to screen for cancer are no longer recommended for nonpregnant women who are considered low risk for cancer of the pelvic organs (ovaries, uterus, and vagina) and who do not have symptoms. Ask your health care provider if a screening pelvic exam is right for you.  If you have had past treatment for cervical cancer or a condition that could lead to cancer, you need Pap tests and screening for cancer for at least 20 years after your treatment. If Pap tests have been discontinued, your  risk factors (such as having a new sexual partner) need to be reassessed to determine if screening should be resumed. Some women have medical problems that increase the chance of getting cervical cancer. In these cases, your health care provider may recommend more frequent screening and Pap tests.  Colorectal cancer can be detected and often prevented. Most routine colorectal cancer screening begins at the age of 23 years and continues through age 42 years. However, your health care provider may recommend screening at an earlier age if you have risk factors for colon  cancer. On a yearly basis, your health care provider may provide home test kits to check for hidden blood in the stool. Use of a small camera at the end of a tube, to directly examine the colon (sigmoidoscopy or colonoscopy), can detect the earliest forms of colorectal cancer. Talk to your health care provider about this at age 50, when routine screening begins. Direct exam of the colon should be repeated every 5-10 years through age 75 years, unless early forms of pre-cancerous polyps or small growths are found.  Hepatitis C blood testing is recommended for all people born from 1945 through 1965 and any individual with known risks for hepatitis C.  Pra  Osteoporosis is a disease in which the bones lose minerals and strength with aging. This can result in serious bone fractures or breaks. The risk of osteoporosis can be identified using a bone density scan. Women ages 65 years and over and women at risk for fractures or osteoporosis should discuss screening with their health care providers. Ask your health care provider whether you should take a calcium supplement or vitamin D to reduce the rate of osteoporosis.  Menopause can be associated with physical symptoms and risks. Hormone replacement therapy is available to decrease symptoms and risks. You should talk to your health care provider about whether hormone replacement therapy is right  for you.  Use sunscreen. Apply sunscreen liberally and repeatedly throughout the day. You should seek shade when your shadow is shorter than you. Protect yourself by wearing long sleeves, pants, a wide-brimmed hat, and sunglasses year round, whenever you are outdoors.  Once a month, do a whole body skin exam, using a mirror to look at the skin on your back. Tell your health care provider of new moles, moles that have irregular borders, moles that are larger than a pencil eraser, or moles that have changed in shape or color.  Stay current with required vaccines (immunizations).  Influenza vaccine. All adults should be immunized every year.  Tetanus, diphtheria, and acellular pertussis (Td, Tdap) vaccine. Pregnant women should receive 1 dose of Tdap vaccine during each pregnancy. The dose should be obtained regardless of the length of time since the last dose. Immunization is preferred during the 27th-36th week of gestation. An adult who has not previously received Tdap or who does not know her vaccine status should receive 1 dose of Tdap. This initial dose should be followed by tetanus and diphtheria toxoids (Td) booster doses every 10 years. Adults with an unknown or incomplete history of completing a 3-dose immunization series with Td-containing vaccines should begin or complete a primary immunization series including a Tdap dose. Adults should receive a Td booster every 10 years.  Varicella vaccine. An adult without evidence of immunity to varicella should receive 2 doses or a second dose if she has previously received 1 dose. Pregnant females who do not have evidence of immunity should receive the first dose after pregnancy. This first dose should be obtained before leaving the health care facility. The second dose should be obtained 4-8 weeks after the first dose.  Human papillomavirus (HPV) vaccine. Females aged 13-26 years who have not received the vaccine previously should obtain the 3-dose  series. The vaccine is not recommended for use in pregnant females. However, pregnancy testing is not needed before receiving a dose. If a female is found to be pregnant after receiving a dose, no treatment is needed. In that case, the remaining doses should be delayed until after the pregnancy. Immunization   is recommended for any person with an immunocompromised condition through the age of 26 years if she did not get any or all doses earlier. During the 3-dose series, the second dose should be obtained 4-8 weeks after the first dose. The third dose should be obtained 24 weeks after the first dose and 16 weeks after the second dose.  Zoster vaccine. One dose is recommended for adults aged 60 years or older unless certain conditions are present.  Measles, mumps, and rubella (MMR) vaccine. Adults born before 1957 generally are considered immune to measles and mumps. Adults born in 1957 or later should have 1 or more doses of MMR vaccine unless there is a contraindication to the vaccine or there is laboratory evidence of immunity to each of the three diseases. A routine second dose of MMR vaccine should be obtained at least 28 days after the first dose for students attending postsecondary schools, health care workers, or international travelers. People who received inactivated measles vaccine or an unknown type of measles vaccine during 1963-1967 should receive 2 doses of MMR vaccine. People who received inactivated mumps vaccine or an unknown type of mumps vaccine before 1979 and are at high risk for mumps infection should consider immunization with 2 doses of MMR vaccine. For females of childbearing age, rubella immunity should be determined. If there is no evidence of immunity, females who are not pregnant should be vaccinated. If there is no evidence of immunity, females who are pregnant should delay immunization until after pregnancy. Unvaccinated health care workers born before 1957 who lack laboratory  evidence of measles, mumps, or rubella immunity or laboratory confirmation of disease should consider measles and mumps immunization with 2 doses of MMR vaccine or rubella immunization with 1 dose of MMR vaccine.  Pneumococcal 13-valent conjugate (PCV13) vaccine. When indicated, a person who is uncertain of her immunization history and has no record of immunization should receive the PCV13 vaccine. An adult aged 19 years or older who has certain medical conditions and has not been previously immunized should receive 1 dose of PCV13 vaccine. This PCV13 should be followed with a dose of pneumococcal polysaccharide (PPSV23) vaccine. The PPSV23 vaccine dose should be obtained at least 8 weeks after the dose of PCV13 vaccine. An adult aged 19 years or older who has certain medical conditions and previously received 1 or more doses of PPSV23 vaccine should receive 1 dose of PCV13. The PCV13 vaccine dose should be obtained 1 or more years after the last PPSV23 vaccine dose.    Pneumococcal polysaccharide (PPSV23) vaccine. When PCV13 is also indicated, PCV13 should be obtained first. All adults aged 65 years and older should be immunized. An adult younger than age 65 years who has certain medical conditions should be immunized. Any person who resides in a nursing home or long-term care facility should be immunized. An adult smoker should be immunized. People with an immunocompromised condition and certain other conditions should receive both PCV13 and PPSV23 vaccines. People with human immunodeficiency virus (HIV) infection should be immunized as soon as possible after diagnosis. Immunization during chemotherapy or radiation therapy should be avoided. Routine use of PPSV23 vaccine is not recommended for American Indians, [place] Natives, or people younger than 65 years unless there are medical conditions that require PPSV23 vaccine. When indicated, people who have unknown immunization and have no record of immunization  should receive PPSV23 vaccine. One-time revaccination 5 years after the first dose of PPSV23 is recommended for people aged 19-64 years who have   chronic kidney failure, nephrotic syndrome, asplenia, or immunocompromised conditions. People who received 1-2 doses of PPSV23 before age 28 years should receive another dose of PPSV23 vaccine at age 64 years or later if at least 5 years have passed since the previous dose. Doses of PPSV23 are not needed for people immunized with PPSV23 at or after age 65 years.  Preventive Services / Frequency   Ages 52 to 76 years  Blood pressure check.  Lipid and cholesterol check.  Lung cancer screening. / Every year if you are aged 62-80 years and have a 30-pack-year history of smoking and currently smoke or have quit within the past 15 years. Yearly screening is stopped once you have quit smoking for at least 15 years or develop a health problem that would prevent you from having lung cancer treatment.  Clinical breast exam.** / Every year after age 38 years.  BRCA-related cancer risk assessment.** / For women who have family members with a BRCA-related cancer (breast, ovarian, tubal, or peritoneal cancers).  Mammogram.** / Every year beginning at age 69 years and continuing for as long as you are in good health. Consult with your health care provider.  Pap test.** / Every 3 years starting at age 29 years through age 47 or 54 years with a history of 3 consecutive normal Pap tests.  HPV screening.** / Every 3 years from ages 1 years through ages 108 to 42 years with a history of 3 consecutive normal Pap tests.  Fecal occult blood test (FOBT) of stool. / Every year beginning at age 36 years and continuing until age 71 years. You may not need to do this test if you get a colonoscopy every 10 years.  Flexible sigmoidoscopy or colonoscopy.** / Every 5 years for a flexible sigmoidoscopy or every 10 years for a colonoscopy beginning at age 65 years and continuing  until age 21 years.  Hepatitis C blood test.** / For all people born from [birth year] through 1965 and any individual with known risks for hepatitis C.  Skin self-exam. / Monthly.  Influenza vaccine. / Every year.  Tetanus, diphtheria, and acellular pertussis (Tdap/Td) vaccine.** / Consult your health care provider. Pregnant women should receive 1 dose of Tdap vaccine during each pregnancy. 1 dose of Td every 10 years.  Varicella vaccine.** / Consult your health care provider. Pregnant females who do not have evidence of immunity should receive the first dose after pregnancy.  Zoster vaccine.** / 1 dose for adults aged 69 years or older.  Pneumococcal 13-valent conjugate (PCV13) vaccine.** / Consult your health care provider.  Pneumococcal polysaccharide (PPSV23) vaccine.** / 1 to 2 doses if you smoke cigarettes or if you have certain conditions.  Meningococcal vaccine.** / Consult your health care provider.  Hepatitis A vaccine.** / Consult your health care provider.  Hepatitis B vaccine.** / Consult your health care provider. Screening for abdominal aortic aneurysm (AAA)  by ultrasound is recommended for people over 50 who have history of high blood pressure or who are current or former smokers.
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
## 2015-07-23 LAB — URINALYSIS, ROUTINE W REFLEX MICROSCOPIC
Bilirubin Urine: NEGATIVE
Glucose, UA: NEGATIVE
Hgb urine dipstick: NEGATIVE
Ketones, ur: NEGATIVE
NITRITE: NEGATIVE
PROTEIN: NEGATIVE
SPECIFIC GRAVITY, URINE: 1.011 (ref 1.001–1.035)
pH: 5.5 (ref 5.0–8.0)

## 2015-07-23 LAB — LIPID PANEL
CHOLESTEROL: 157 mg/dL (ref 125–200)
HDL: 64 mg/dL (ref >=46)
LDL Cholesterol: 76 mg/dL (ref <130)
TRIGLYCERIDES: 85 mg/dL (ref <150)
Total CHOL/HDL Ratio: 2.5 Ratio (ref <=5.0)
VLDL: 17 mg/dL (ref <30)

## 2015-07-23 LAB — URINALYSIS, MICROSCOPIC ONLY
Bacteria, UA: NONE SEEN [HPF]
CRYSTALS: NONE SEEN [HPF]
Casts: NONE SEEN [LPF]
RBC / HPF: NONE SEEN RBC/HPF (ref <=2)
SQUAMOUS EPITHELIAL / LPF: NONE SEEN [HPF] (ref <=5)
Yeast: NONE SEEN [HPF]

## 2015-07-23 LAB — HEMOGLOBIN A1C
HEMOGLOBIN A1C: 5.8 % — AB (ref <5.7)
MEAN PLASMA GLUCOSE: 120 mg/dL — AB (ref <117)

## 2015-07-23 LAB — BASIC METABOLIC PANEL WITH GFR
BUN: 22 mg/dL (ref 7–25)
CALCIUM: 9.1 mg/dL (ref 8.6–10.4)
CO2: 30 mmol/L (ref 20–31)
CREATININE: 1.08 mg/dL — AB (ref 0.50–0.99)
Chloride: 102 mmol/L (ref 98–110)
GFR, EST AFRICAN AMERICAN: 64 mL/min (ref >=60)
GFR, EST NON AFRICAN AMERICAN: 56 mL/min — AB (ref >=60)
GLUCOSE: 99 mg/dL (ref 65–99)
Potassium: 4.2 mmol/L (ref 3.5–5.3)
Sodium: 142 mmol/L (ref 135–146)

## 2015-07-23 LAB — MICROALBUMIN / CREATININE URINE RATIO
Creatinine, Urine: 80 mg/dL (ref 20–320)
Microalb, Ur: <0.2 mg/dL

## 2015-07-23 LAB — HEPATIC FUNCTION PANEL
ALBUMIN: 3.8 g/dL (ref 3.6–5.1)
ALT: 10 U/L (ref 6–29)
AST: 11 U/L (ref 10–35)
Alkaline Phosphatase: 83 U/L (ref 33–130)
Bilirubin, Direct: 0.1 mg/dL (ref <=0.2)
Indirect Bilirubin: 0.3 mg/dL (ref 0.2–1.2)
TOTAL PROTEIN: 6.3 g/dL (ref 6.1–8.1)
Total Bilirubin: 0.4 mg/dL (ref 0.2–1.2)

## 2015-07-23 LAB — INSULIN, RANDOM: INSULIN: 64.1 u[IU]/mL — AB (ref 2.0–19.6)

## 2015-07-23 LAB — VITAMIN D 25 HYDROXY (VIT D DEFICIENCY, FRACTURES): Vit D, 25-Hydroxy: 70 ng/mL (ref 30–100)

## 2015-07-23 LAB — MAGNESIUM: MAGNESIUM: 2 mg/dL (ref 1.5–2.5)

## 2015-07-23 LAB — TSH: TSH: 0.777 u[IU]/mL (ref 0.350–4.500)

## 2015-08-26 ENCOUNTER — Other Ambulatory Visit: Payer: Self-pay | Admitting: Internal Medicine

## 2015-08-26 DIAGNOSIS — G308 Other Alzheimer's disease: Secondary | ICD-10-CM | POA: Diagnosis not present

## 2015-08-26 DIAGNOSIS — W2212XA Striking against or struck by front passenger side automobile airbag, initial encounter: Secondary | ICD-10-CM | POA: Diagnosis not present

## 2015-08-26 DIAGNOSIS — H3322 Serous retinal detachment, left eye: Secondary | ICD-10-CM | POA: Diagnosis not present

## 2015-08-26 DIAGNOSIS — H43822 Vitreomacular adhesion, left eye: Secondary | ICD-10-CM | POA: Diagnosis not present

## 2015-08-29 ENCOUNTER — Other Ambulatory Visit: Payer: Self-pay | Admitting: Internal Medicine

## 2015-09-28 ENCOUNTER — Encounter: Payer: Self-pay | Admitting: Gastroenterology

## 2015-10-21 ENCOUNTER — Ambulatory Visit (INDEPENDENT_AMBULATORY_CARE_PROVIDER_SITE_OTHER): Payer: Medicare Other | Admitting: Internal Medicine

## 2015-10-21 ENCOUNTER — Encounter: Payer: Self-pay | Admitting: Internal Medicine

## 2015-10-21 VITALS — BP 116/70 | HR 66 | Temp 98.2°F | Resp 16 | Ht 65.0 in | Wt 164.0 lb

## 2015-10-21 DIAGNOSIS — E538 Deficiency of other specified B group vitamins: Secondary | ICD-10-CM | POA: Diagnosis not present

## 2015-10-21 DIAGNOSIS — E782 Mixed hyperlipidemia: Secondary | ICD-10-CM

## 2015-10-21 DIAGNOSIS — E559 Vitamin D deficiency, unspecified: Secondary | ICD-10-CM | POA: Diagnosis not present

## 2015-10-21 DIAGNOSIS — Z79899 Other long term (current) drug therapy: Secondary | ICD-10-CM

## 2015-10-21 DIAGNOSIS — R7303 Prediabetes: Secondary | ICD-10-CM

## 2015-10-21 DIAGNOSIS — D509 Iron deficiency anemia, unspecified: Secondary | ICD-10-CM

## 2015-10-21 DIAGNOSIS — I10 Essential (primary) hypertension: Secondary | ICD-10-CM

## 2015-10-21 DIAGNOSIS — R7309 Other abnormal glucose: Secondary | ICD-10-CM | POA: Diagnosis not present

## 2015-10-21 MED ORDER — POLYETHYLENE GLYCOL 3350 17 G PO PACK: 17.0000 g | PACK | Freq: Every day | ORAL | Status: DC

## 2015-10-21 MED ORDER — RANITIDINE HCL 300 MG PO TABS: 300.0000 mg | ORAL_TABLET | Freq: Every day | ORAL | Status: DC

## 2015-10-21 NOTE — Progress Notes (Signed)
Assessment and Plan:  Hypertension:  -Continue medication,  -monitor blood pressure at home.  -Continue DASH diet.   -Reminder to go to the ER if any CP, SOB, nausea, dizziness, severe HA, changes vision/speech, left arm numbness and tingling, and jaw pain.  Cholesterol: -Continue diet and exercise.  -Check cholesterol.   Pre-diabetes: -Continue diet and exercise.  -Check A1C  Vitamin D Def: -continue medications.   SDAT -cont exelon    Continue diet and meds as discussed. Further disposition pending results of labs.  HPI 60 y.o. female  presents for 3 month follow up with hypertension, hyperlipidemia, prediabetes and vitamin D.   Her blood pressure has been controlled at home, today their BP is BP: 116/70 mmHg.   She does not workout. She denies chest pain, shortness of breath, dizziness.   She is on cholesterol medication and denies myalgias. Her cholesterol is at goal. The cholesterol last visit was:   Lab Results  Component Value Date   CHOL 157 07/22/2015   HDL 64 07/22/2015   LDLCALC 76 07/22/2015   TRIG 85 07/22/2015   CHOLHDL 2.5 07/22/2015     She has been working on diet and exercise for prediabetes, and denies foot ulcerations, hyperglycemia, hypoglycemia , increased appetite, nausea, paresthesia of the feet, polydipsia, polyuria, visual disturbances, vomiting and weight loss. Last A1C in the office was:  Lab Results  Component Value Date   HGBA1C 5.8* 07/22/2015    Patient is on Vitamin D supplement.  Lab Results  Component Value Date   VD25OH 85 07/22/2015      She does have some intermittent stomach pains especially if they haven't eaten for a while or if she has had too big of a meal.  She has had some acid reflux in the past.  She has taken prilosec in the past without relief.  She is also slighlty constipated per her husband.  Current Medications:  Current Outpatient Prescriptions on File Prior to Visit  Medication Sig Dispense Refill  .  acetaminophen (TYLENOL) 325 MG tablet Take 650 mg by mouth every 6 (six) hours as needed for mild pain, fever or headache.    Marland Kitchen aspirin 81 MG tablet Take 81 mg by mouth at bedtime.     Marland Kitchen atenolol (TENORMIN) 100 MG tablet Take 1 tablet daily for BP 90 tablet 1  . atorvastatin (LIPITOR) 40 MG tablet Take 1 tablet (40 mg total) by mouth daily. 30 tablet 5  . Omega-3 Fatty Acids (THE VERY FINEST FISH OIL) LIQD Take 30 mLs by mouth at bedtime. Reported on 07/22/2015    . ondansetron (ZOFRAN) 8 MG tablet Take 1 tablet (8 mg total) by mouth every 8 (eight) hours as needed for nausea or vomiting. 20 tablet 1  . OVER THE COUNTER MEDICATION Takes OTC B 12.    . rivastigmine (EXELON) 4.6 mg/24hr APPLY 1 PATCH DAILY FOR MEMORY 90 patch 1  . Vitamin D, Ergocalciferol, (DRISDOL) 50000 units CAPS capsule TAKE 1 CAPSULE DAILY OR AS DIRECTED FOR SEVERE VIT D DEFICIENCY 90 capsule 1   No current facility-administered medications on file prior to visit.    Medical History:  Past Medical History  Diagnosis Date  . Diverticulosis of colon (without mention of hemorrhage)   . IBS (irritable bowel syndrome)   . Unspecified gastritis and gastroduodenitis without mention of hemorrhage   . Family history of malignant neoplasm of gastrointestinal tract   . Hypercholesterolemia   . Chronic headaches   . Vitamin B12 deficiency   .  Anemia     Iron def   . Bacterial overgrowth syndrome   . Pancreatitis   . GERD (gastroesophageal reflux disease)   . Osteoarthritis   . Hypertension   . Depression   . Headache(784.0)   . Dementia     Allergies:  Allergies  Allergen Reactions  . Amitriptyline Other (See Comments)    dysphoria  . Carafate [Sucralfate] Other (See Comments)    constipation  . Amitriptyline Other (See Comments)    dysphoria  . Carafate [Sucralfate] Other (See Comments)    constipation  . Codeine Other (See Comments)    Insomnia; agitation; restlessness   . Lexapro [Escitalopram Oxalate]  Other (See Comments)    Insomnia; agitation; restlessness   . Paxil [Paroxetine Hcl] Other (See Comments)    Insomnia; agitation; restlessness   . Prednisone Other (See Comments)    Unknown reaction  . Promethazine Nausea Only  . Zoloft [Sertraline Hcl] Other (See Comments)    Insomnia; agitation; restlessness   . Codeine Other (See Comments)    Insomnia; agitation; restlessness   . Lexapro [Escitalopram Oxalate] Other (See Comments)    Insomnia; agitation; restlessness   . Paxil [Paroxetine Hcl] Other (See Comments)    Insomnia; agitation; restlessness   . Prednisone Other (See Comments)    unknown  . Promethazine Hcl Nausea Only  . Zoloft [Sertraline Hcl] Other (See Comments)    Insomnia; agitation; restlessness      Review of Systems:  Review of Systems  Constitutional: Negative for fever, chills and malaise/fatigue.  HENT: Negative for congestion, ear pain and sore throat.   Eyes: Negative.   Respiratory: Negative for cough, shortness of breath and wheezing.   Cardiovascular: Negative for chest pain, palpitations and leg swelling.  Gastrointestinal: Negative for heartburn, abdominal pain, diarrhea, constipation, blood in stool and melena.  Genitourinary: Negative.   Skin: Negative.   Neurological: Negative for dizziness, sensory change, loss of consciousness and headaches.  Psychiatric/Behavioral: Negative for depression. The patient is not nervous/anxious and does not have insomnia.     Family history- Review and unchanged  Social history- Review and unchanged  Physical Exam: BP 116/70 mmHg  Pulse 66  Temp(Src) 98.2 F (36.8 C) (Temporal)  Resp 16  Ht 5\' 5"  (1.651 m)  Wt 164 lb (74.39 kg)  BMI 27.29 kg/m2 Wt Readings from Last 3 Encounters:  10/21/15 164 lb (74.39 kg)  07/22/15 167 lb 12.8 oz (76.114 kg)  02/01/15 153 lb (69.4 kg)    General Appearance: Well nourished well developed, in no apparent distress. Eyes: PERRLA, EOMs, conjunctiva no swelling or  erythema ENT/Mouth: Ear canals normal without obstruction, swelling, erythma, discharge.  TMs normal bilaterally.  Oropharynx moist, clear, without exudate, or postoropharyngeal swelling. Neck: Supple, thyroid normal,no cervical adenopathy  Respiratory: Respiratory effort normal, Breath sounds clear A&P without rhonchi, wheeze, or rale.  No retractions, no accessory usage. Cardio: RRR with no MRGs. Brisk peripheral pulses without edema.  Abdomen: Soft, + BS,  Non tender, no guarding, rebound, hernias, masses. Musculoskeletal: Full ROM, 5/5 strength, Normal gait Skin: Warm, dry without rashes, lesions, ecchymosis.  Neuro: Awake and oriented X 3, Cranial nerves intact. Normal muscle tone, no cerebellar symptoms. Psych: Normal affect, Insight and Judgment appropriate.    Starlyn Skeans, PA-C 3:21 PM [facility]

## 2015-10-22 LAB — HEPATIC FUNCTION PANEL
ALBUMIN: 3.9 g/dL (ref 3.6–5.1)
ALT: 9 U/L (ref 6–29)
AST: 11 U/L (ref 10–35)
Alkaline Phosphatase: 84 U/L (ref 33–130)
BILIRUBIN DIRECT: 0.1 mg/dL (ref <=0.2)
BILIRUBIN TOTAL: 0.5 mg/dL (ref 0.2–1.2)
Indirect Bilirubin: 0.4 mg/dL (ref 0.2–1.2)
Total Protein: 6.5 g/dL (ref 6.1–8.1)

## 2015-10-22 LAB — CBC WITH DIFFERENTIAL/PLATELET
BASOS PCT: 1 % (ref 0–1)
Basophils Absolute: 0.1 10*3/uL (ref 0.0–0.1)
Eosinophils Absolute: 0.4 10*3/uL (ref 0.0–0.7)
Eosinophils Relative: 5 % (ref 0–5)
HEMATOCRIT: 40.6 % (ref 36.0–46.0)
HEMOGLOBIN: 13.8 g/dL (ref 12.0–15.0)
LYMPHS PCT: 23 % (ref 12–46)
Lymphs Abs: 2 10*3/uL (ref 0.7–4.0)
MCH: 29.8 pg (ref 26.0–34.0)
MCHC: 34 g/dL (ref 30.0–36.0)
MCV: 87.7 fL (ref 78.0–100.0)
MONO ABS: 0.6 10*3/uL (ref 0.1–1.0)
MONOS PCT: 7 % (ref 3–12)
MPV: 10.5 fL (ref 8.6–12.4)
NEUTROS ABS: 5.5 10*3/uL (ref 1.7–7.7)
NEUTROS PCT: 64 % (ref 43–77)
PLATELETS: 301 10*3/uL (ref 150–400)
RBC: 4.63 MIL/uL (ref 3.87–5.11)
RDW: 13.4 % (ref 11.5–15.5)
WBC: 8.6 10*3/uL (ref 4.0–10.5)

## 2015-10-22 LAB — LIPID PANEL
CHOL/HDL RATIO: 2.8 ratio (ref <=5.0)
CHOLESTEROL: 162 mg/dL (ref 125–200)
HDL: 57 mg/dL (ref >=46)
LDL Cholesterol: 80 mg/dL (ref <130)
TRIGLYCERIDES: 126 mg/dL (ref <150)
VLDL: 25 mg/dL (ref <30)

## 2015-10-22 LAB — BASIC METABOLIC PANEL WITH GFR
BUN: 18 mg/dL (ref 7–25)
CHLORIDE: 103 mmol/L (ref 98–110)
CO2: 30 mmol/L (ref 20–31)
CREATININE: 0.99 mg/dL (ref 0.50–0.99)
Calcium: 9.1 mg/dL (ref 8.6–10.4)
GFR, EST AFRICAN AMERICAN: 72 mL/min (ref >=60)
GFR, Est Non African American: 62 mL/min (ref >=60)
Glucose, Bld: 92 mg/dL (ref 65–99)
POTASSIUM: 4.4 mmol/L (ref 3.5–5.3)
SODIUM: 140 mmol/L (ref 135–146)

## 2015-10-22 LAB — HEMOGLOBIN A1C
Hgb A1c MFr Bld: 6.1 % — ABNORMAL HIGH (ref <5.7)
Mean Plasma Glucose: 128 mg/dL

## 2015-10-22 LAB — TSH: TSH: 0.78 m[IU]/L

## 2016-01-13 ENCOUNTER — Other Ambulatory Visit: Payer: Self-pay | Admitting: Physician Assistant

## 2016-01-24 ENCOUNTER — Ambulatory Visit: Payer: Self-pay | Admitting: Internal Medicine

## 2016-01-27 ENCOUNTER — Ambulatory Visit (INDEPENDENT_AMBULATORY_CARE_PROVIDER_SITE_OTHER): Payer: Medicare Other | Admitting: Internal Medicine

## 2016-01-27 ENCOUNTER — Encounter: Payer: Self-pay | Admitting: Internal Medicine

## 2016-01-27 VITALS — BP 110/64 | HR 64 | Temp 97.1°F | Resp 16 | Ht 65.0 in | Wt 166.6 lb

## 2016-01-27 DIAGNOSIS — E782 Mixed hyperlipidemia: Secondary | ICD-10-CM

## 2016-01-27 DIAGNOSIS — R7303 Prediabetes: Secondary | ICD-10-CM

## 2016-01-27 DIAGNOSIS — G308 Other Alzheimer's disease: Secondary | ICD-10-CM | POA: Diagnosis not present

## 2016-01-27 DIAGNOSIS — E559 Vitamin D deficiency, unspecified: Secondary | ICD-10-CM

## 2016-01-27 DIAGNOSIS — I10 Essential (primary) hypertension: Secondary | ICD-10-CM | POA: Diagnosis not present

## 2016-01-27 DIAGNOSIS — Z79899 Other long term (current) drug therapy: Secondary | ICD-10-CM

## 2016-01-27 DIAGNOSIS — F0280 Dementia in other diseases classified elsewhere without behavioral disturbance: Secondary | ICD-10-CM

## 2016-01-27 DIAGNOSIS — G301 Alzheimer's disease with late onset: Secondary | ICD-10-CM

## 2016-01-27 LAB — CBC WITH DIFFERENTIAL/PLATELET
BASOS PCT: 1 %
Basophils Absolute: 85 cells/uL (ref 0–200)
EOS PCT: 3 %
Eosinophils Absolute: 255 cells/uL (ref 15–500)
HCT: 38.7 % (ref 35.0–45.0)
HEMOGLOBIN: 12.7 g/dL (ref 11.7–15.5)
LYMPHS ABS: 2295 {cells}/uL (ref 850–3900)
Lymphocytes Relative: 27 %
MCH: 28.9 pg (ref 27.0–33.0)
MCHC: 32.8 g/dL (ref 32.0–36.0)
MCV: 88.2 fL (ref 80.0–100.0)
MONO ABS: 510 {cells}/uL (ref 200–950)
MPV: 10.4 fL (ref 7.5–12.5)
Monocytes Relative: 6 %
NEUTROS PCT: 63 %
Neutro Abs: 5355 cells/uL (ref 1500–7800)
Platelets: 304 10*3/uL (ref 140–400)
RBC: 4.39 MIL/uL (ref 3.80–5.10)
RDW: 13.3 % (ref 11.0–15.0)
WBC: 8.5 10*3/uL (ref 3.8–10.8)

## 2016-01-27 NOTE — Patient Instructions (Signed)

## 2016-01-28 LAB — LIPID PANEL
CHOL/HDL RATIO: 2.5 ratio (ref <=5.0)
CHOLESTEROL: 148 mg/dL (ref 125–200)
HDL: 59 mg/dL (ref >=46)
LDL Cholesterol: 71 mg/dL (ref <130)
TRIGLYCERIDES: 88 mg/dL (ref <150)
VLDL: 18 mg/dL (ref <30)

## 2016-01-28 LAB — BASIC METABOLIC PANEL WITH GFR
BUN: 17 mg/dL (ref 7–25)
CALCIUM: 8.7 mg/dL (ref 8.6–10.4)
CO2: 27 mmol/L (ref 20–31)
Chloride: 104 mmol/L (ref 98–110)
Creat: 0.87 mg/dL (ref 0.50–0.99)
GFR, EST AFRICAN AMERICAN: 84 mL/min (ref >=60)
GFR, EST NON AFRICAN AMERICAN: 73 mL/min (ref >=60)
Glucose, Bld: 91 mg/dL (ref 65–99)
POTASSIUM: 4.7 mmol/L (ref 3.5–5.3)
SODIUM: 142 mmol/L (ref 135–146)

## 2016-01-28 LAB — HEMOGLOBIN A1C
Hgb A1c MFr Bld: 5.9 % — ABNORMAL HIGH (ref <5.7)
Mean Plasma Glucose: 123 mg/dL

## 2016-01-28 LAB — INSULIN, RANDOM: Insulin: 6.9 u[IU]/mL (ref 2.0–19.6)

## 2016-01-28 LAB — HEPATIC FUNCTION PANEL
ALK PHOS: 90 U/L (ref 33–130)
ALT: 9 U/L (ref 6–29)
AST: 13 U/L (ref 10–35)
Albumin: 3.8 g/dL (ref 3.6–5.1)
BILIRUBIN DIRECT: 0.1 mg/dL (ref <=0.2)
BILIRUBIN INDIRECT: 0.4 mg/dL (ref 0.2–1.2)
Total Bilirubin: 0.5 mg/dL (ref 0.2–1.2)
Total Protein: 6.3 g/dL (ref 6.1–8.1)

## 2016-01-28 LAB — MAGNESIUM: Magnesium: 2 mg/dL (ref 1.5–2.5)

## 2016-01-28 LAB — TSH: TSH: 0.8 mIU/L

## 2016-01-28 LAB — VITAMIN D 25 HYDROXY (VIT D DEFICIENCY, FRACTURES): VIT D 25 HYDROXY: 74 ng/mL (ref 30–100)

## 2016-01-29 ENCOUNTER — Encounter: Payer: Self-pay | Admitting: Internal Medicine

## 2016-01-29 NOTE — Progress Notes (Signed)
Patient ID: Helen Cabrera, female   DOB: 09-06-1955, 60 y.o.   MRN: [id number]  [facility] ADULT & ADOLESCENT INTERNAL MEDICINE                       [name], M.D.        Uvaldo Bristle. Silverio Lay, P.A.-C       Starlyn Skeans, P.A.-C   [facility]                [address] [place] [id number] Telephone [phone] Telefax [phone] ______________________________________________________________________     This very nice 60 y.o. MWF presents for 6 month follow up with Hypertension, Hyperlipidemia, Pre-Diabetes, SDAT  and Vitamin D Deficiency. Patient require 24 hour supervision and moderate assistance in ADL's as provided by her husband     Patient is treated for HTN  Circa 2008 & BP has been controlled at home. Today's BP: 110/64 mmHg. Patient has had no complaints of any cardiac type chest pain, palpitations, dyspnea/orthopnea/PND, dizziness, claudication, or dependent edema.     Hyperlipidemia is controlled with diet & meds. Patient denies myalgias or other med SE's.  Current  Lipids are at goal with Cholesterol 148; HDL 59; LDL 71; Triglycerides 88.     Also, the patient has history of PreDiabetes with A1c 6.0% in 2010 and has had no symptoms of reactive hypoglycemia, diabetic polys, paresthesias or visual blurring.  Current A1c  5.9% is not at goal .     Further, the patient also has history of Vitamin D Deficiency  Of "15" in 2008 and supplements vitamin D without any suspected side-effects. Current   vitamin D is at goal with level of  74.    Medication Sig  . acetaminophen  325 MG tablet Take 650 mg by mouth every 6 (six) hours as needed for mild pain, fever or headache.  Marland Kitchen aspirin 81 MG tablet Take 81 mg by mouth at bedtime.   Marland Kitchen atenolol  100 MG tablet TAKE 1 TABLET BY MOUTH EVERY DAY FOR BLOOD PRESSURE  . atorvastatin40 MG tablet Take 1 tablet (40 mg total) by mouth daily.  . Omega-3 Fatty Acids  FISH OIL Take 30 mLs  by mouth at bedtime. Reported on 07/22/2015  . ondansetron  8 MG tablet Take 1 tablet (8 mg total) by mouth every 8 (eight) hours as needed for nausea or vomiting.   OTC B 12.   Marland Kitchen MIRALAX  Take 17 g by mouth daily.  . ranitidine  300 MG tablet Take 1 tablet (300 mg total) by mouth at bedtime.  . rivastigmine (EXELON) 4.6  APPLY 1 PATCH DAILY FOR MEMORY  . Vit D  50,000 units  TAKE 1 CAPSULE DAILY OR AS DIRECTED FOR SEVERE VIT D DEFICIENCY  . atenolol 100 MG tablet Take 1 tablet daily for BP   Allergies  Allergen Reactions  . Amitriptyline Other (See Comments)    dysphoria  . Carafate [Sucralfate] Other (See Comments)    constipation  . Amitriptyline Other (See Comments)    dysphoria  . Carafate [Sucralfate] Other (See Comments)    constipation  . Codeine Other (See Comments)    Insomnia; agitation; restlessness   . Lexapro [Escitalopram Oxalate] Other (See Comments)    Insomnia; agitation; restlessness   . Paxil [Paroxetine Hcl] Other (See Comments)    Insomnia; agitation; restlessness   .  Prednisone Other (See Comments)    Unknown reaction  . Promethazine Nausea Only  . Zoloft [Sertraline Hcl] Other (See Comments)    Insomnia; agitation; restlessness   . Codeine Other (See Comments)    Insomnia; agitation; restlessness   . Lexapro [Escitalopram Oxalate] Other (See Comments)    Insomnia; agitation; restlessness   . Paxil [Paroxetine Hcl] Other (See Comments)    Insomnia; agitation; restlessness   . Prednisone Other (See Comments)    unknown  . Promethazine Hcl Nausea Only  . Zoloft [Sertraline Hcl] Other (See Comments)    Insomnia; agitation; restlessness     PMHx:   Past Medical History  Diagnosis Date  . Diverticulosis of colon (without mention of hemorrhage)   . IBS (irritable bowel syndrome)   . Unspecified gastritis and gastroduodenitis without mention of hemorrhage   . Family history of malignant neoplasm of gastrointestinal tract   . Hypercholesterolemia   .  Chronic headaches   . Vitamin B12 deficiency   . Anemia     Iron def   . Bacterial overgrowth syndrome   . Pancreatitis   . GERD (gastroesophageal reflux disease)   . Osteoarthritis   . Hypertension   . Depression   . Headache(784.0)   . Dementia    Immunization History  Administered Date(s) Administered  . PPD Test 04/16/2014  . Pneumococcal Polysaccharide-23 04/03/2012  . Tdap 04/03/2012   Past Surgical History  Procedure Laterality Date  . Cesarean section      x 4  . Abdominal hysterectomy    . Appendectomy    . Cesarean section      x4  . Pars plana vitrectomy Right 04/02/2014    Procedure: RIGHT PARS PLANA VITRECTOMY WITH 25 GAUGE/ENDO LASER/LENSECTOMY/INSERTION OF SILICONE OIL,REPAIR COMPLEX RETINAL DETACHMENT,MEMBRANE PEEL, IRIDECTOMY;  Hurman Horn, MD   FHx:    Reviewed / unchanged  SHx:    Reviewed / unchanged  Systems Review:  Unreliable as limited by her moderate Dementia , but caretaker spouse reports systems review is negative.   Physical Exam  BP 110/64 mmHg  Pulse 64  Temp(Src) 97.1 F (36.2 C)  Resp 16  Ht 5\' 5"  (1.651 m)  Wt 166 lb 9.6 oz (75.569 kg)  BMI 27.72 kg/m2  Appears well nourished and in no distress.  Eyes: PERRLA, EOMs, conjunctiva no swelling or erythema. Sinuses: No frontal/maxillary tenderness ENT/Mouth: EAC's clear, TM's nl w/o erythema, bulging. Nares clear w/o erythema, swelling, exudates. Oropharynx clear without erythema or exudates. Oral hygiene is good. Tongue normal, non obstructing. Hearing intact.  Neck: Supple. Thyroid nl. Car 2+/2+ without bruits, nodes or JVD. Chest: Respirations nl with BS clear & equal w/o rales, rhonchi, wheezing or stridor.  Cor: Heart sounds normal w/ regular rate and rhythm without sig. murmurs, gallops, clicks, or rubs. Peripheral pulses normal and equal  without edema.  Abdomen: Soft & bowel sounds normal. Non-tender w/o guarding, rebound, hernias, masses, or organomegaly.  Lymphatics:  Unremarkable.  Musculoskeletal: Full ROM all peripheral extremities, joint stability, 5/5 strength, and normal gait.  Skin: Warm, dry without exposed rashes, lesions or ecchymosis apparent.  Neuro: Cranial nerves intact, reflexes equal bilaterally. Sensory-motor testing grossly intact. Tendon reflexes grossly intact.  Pysch: Affect flat & oriented x 1-2.  Insight and judgement limited . No hallucinations. Unable to answer simple questions.   Assessment and Plan:  1. Essential hypertension  - Continue medication, monitor blood pressure at home. Continue DASH diet. Reminder to go to the ER if any CP,  SOB, nausea, dizziness, severe HA, changes vision/speech, left arm numbness and tingling and jaw pain. - TSH  2. Mixed hyperlipidemia  - Continue diet/meds, exercise,& lifestyle modifications. Continue monitor periodic cholesterol/liver & renal functions  - Lipid panel - TSH  3. Prediabetes  - Continue diet, exercise, lifestyle modifications. Monitor appropriate labs. - Hemoglobin A1c - Insulin, random  4. Vitamin D deficiency  - Continue supplementation. - VITAMIN D 25 Hydroxy   5. SDAT (senile dementia of Alzheimer's type)   6. Medication management  - CBC with Differential/Platelet - BASIC METABOLIC PANEL WITH GFR - Hepatic function panel - Magnesium   Recommended regular exercise, BP monitoring, weight control, and discussed med and SE's. Recommended labs to assess and monitor clinical status. Further disposition pending results of labs. Over 30 minutes of exam, counseling, chart review was performed

## 2016-02-25 ENCOUNTER — Other Ambulatory Visit: Payer: Self-pay | Admitting: Internal Medicine

## 2016-03-02 ENCOUNTER — Inpatient Hospital Stay (HOSPITAL_COMMUNITY)
Admission: AD | Admit: 2016-03-02 | Discharge: 2016-03-05 | DRG: 101 | Disposition: A | Discharge status: Home Health | Payer: Medicare Other | Source: Other Acute Inpatient Hospital | Attending: Internal Medicine | Admitting: Internal Medicine

## 2016-03-02 DIAGNOSIS — R599 Enlarged lymph nodes, unspecified: Secondary | ICD-10-CM | POA: Diagnosis not present

## 2016-03-02 DIAGNOSIS — E785 Hyperlipidemia, unspecified: Secondary | ICD-10-CM | POA: Diagnosis not present

## 2016-03-02 DIAGNOSIS — R55 Syncope and collapse: Secondary | ICD-10-CM | POA: Diagnosis not present

## 2016-03-02 DIAGNOSIS — G309 Alzheimer's disease, unspecified: Secondary | ICD-10-CM | POA: Diagnosis present

## 2016-03-02 DIAGNOSIS — F0391 Unspecified dementia with behavioral disturbance: Secondary | ICD-10-CM | POA: Diagnosis not present

## 2016-03-02 DIAGNOSIS — E876 Hypokalemia: Secondary | ICD-10-CM | POA: Diagnosis present

## 2016-03-02 DIAGNOSIS — Z8659 Personal history of other mental and behavioral disorders: Secondary | ICD-10-CM | POA: Diagnosis not present

## 2016-03-02 DIAGNOSIS — E7800 Pure hypercholesterolemia, unspecified: Secondary | ICD-10-CM | POA: Diagnosis not present

## 2016-03-02 DIAGNOSIS — R509 Fever, unspecified: Secondary | ICD-10-CM

## 2016-03-02 DIAGNOSIS — R569 Unspecified convulsions: Secondary | ICD-10-CM | POA: Diagnosis not present

## 2016-03-02 DIAGNOSIS — F0280 Dementia in other diseases classified elsewhere without behavioral disturbance: Secondary | ICD-10-CM | POA: Diagnosis present

## 2016-03-02 DIAGNOSIS — R29898 Other symptoms and signs involving the musculoskeletal system: Secondary | ICD-10-CM | POA: Diagnosis present

## 2016-03-02 DIAGNOSIS — R9082 White matter disease, unspecified: Secondary | ICD-10-CM | POA: Diagnosis not present

## 2016-03-02 DIAGNOSIS — I10 Essential (primary) hypertension: Secondary | ICD-10-CM | POA: Diagnosis present

## 2016-03-02 DIAGNOSIS — K219 Gastro-esophageal reflux disease without esophagitis: Secondary | ICD-10-CM | POA: Diagnosis present

## 2016-03-02 DIAGNOSIS — Z7982 Long term (current) use of aspirin: Secondary | ICD-10-CM

## 2016-03-02 DIAGNOSIS — F0390 Unspecified dementia without behavioral disturbance: Secondary | ICD-10-CM | POA: Diagnosis not present

## 2016-03-02 DIAGNOSIS — G308 Other Alzheimer's disease: Secondary | ICD-10-CM | POA: Diagnosis not present

## 2016-03-02 DIAGNOSIS — R32 Unspecified urinary incontinence: Secondary | ICD-10-CM | POA: Diagnosis present

## 2016-03-02 DIAGNOSIS — R000 Tachycardia, unspecified: Secondary | ICD-10-CM | POA: Diagnosis not present

## 2016-03-02 DIAGNOSIS — Z79899 Other long term (current) drug therapy: Secondary | ICD-10-CM | POA: Diagnosis not present

## 2016-03-02 DIAGNOSIS — R4182 Altered mental status, unspecified: Secondary | ICD-10-CM | POA: Diagnosis not present
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
## 2016-03-03 ENCOUNTER — Inpatient Hospital Stay (HOSPITAL_COMMUNITY): Payer: Medicare Other

## 2016-03-03 ENCOUNTER — Inpatient Hospital Stay (HOSPITAL_COMMUNITY)
Admission: AD | Admit: 2016-03-03 | Discharge: 2016-03-03 | Disposition: A | Discharge status: Home / Self Care | Payer: Medicare Other | Source: Other Acute Inpatient Hospital | Attending: Neurology | Admitting: Neurology

## 2016-03-03 DIAGNOSIS — R509 Fever, unspecified: Secondary | ICD-10-CM

## 2016-03-03 DIAGNOSIS — F0391 Unspecified dementia with behavioral disturbance: Secondary | ICD-10-CM

## 2016-03-03 DIAGNOSIS — E785 Hyperlipidemia, unspecified: Secondary | ICD-10-CM

## 2016-03-03 DIAGNOSIS — G308 Other Alzheimer's disease: Secondary | ICD-10-CM

## 2016-03-03 DIAGNOSIS — R29898 Other symptoms and signs involving the musculoskeletal system: Secondary | ICD-10-CM

## 2016-03-03 DIAGNOSIS — Z8659 Personal history of other mental and behavioral disorders: Secondary | ICD-10-CM

## 2016-03-03 DIAGNOSIS — R569 Unspecified convulsions: Principal | ICD-10-CM

## 2016-03-03 DIAGNOSIS — I10 Essential (primary) hypertension: Secondary | ICD-10-CM

## 2016-03-03 LAB — COMPREHENSIVE METABOLIC PANEL
ALK PHOS: 83 U/L (ref 38–126)
ALT: 23 U/L (ref 14–54)
AST: 57 U/L — ABNORMAL HIGH (ref 15–41)
Albumin: 3.5 g/dL (ref 3.5–5.0)
Anion gap: 12 (ref 5–15)
BILIRUBIN TOTAL: 1.3 mg/dL — AB (ref 0.3–1.2)
BUN: 10 mg/dL (ref 6–20)
CALCIUM: 8.9 mg/dL (ref 8.9–10.3)
CO2: 27 mmol/L (ref 22–32)
Chloride: 102 mmol/L (ref 101–111)
Creatinine, Ser: 0.9 mg/dL (ref 0.44–1.00)
GLUCOSE: 117 mg/dL — AB (ref 65–99)
POTASSIUM: 3.2 mmol/L — AB (ref 3.5–5.1)
Sodium: 141 mmol/L (ref 135–145)
TOTAL PROTEIN: 6.6 g/dL (ref 6.5–8.1)

## 2016-03-03 LAB — URINALYSIS, ROUTINE W REFLEX MICROSCOPIC
BILIRUBIN URINE: NEGATIVE
Glucose, UA: NEGATIVE mg/dL
KETONES UR: NEGATIVE mg/dL
LEUKOCYTES UA: NEGATIVE
NITRITE: NEGATIVE
PROTEIN: NEGATIVE mg/dL
Specific Gravity, Urine: 1.014 (ref 1.005–1.030)
pH: 5.5 (ref 5.0–8.0)

## 2016-03-03 LAB — CBC
HEMATOCRIT: 40.6 % (ref 36.0–46.0)
HEMOGLOBIN: 13.2 g/dL (ref 12.0–15.0)
MCH: 29.1 pg (ref 26.0–34.0)
MCHC: 32.5 g/dL (ref 30.0–36.0)
MCV: 89.4 fL (ref 78.0–100.0)
Platelets: 207 10*3/uL (ref 150–400)
RBC: 4.54 MIL/uL (ref 3.87–5.11)
RDW: 12.9 % (ref 11.5–15.5)
WBC: 13.2 10*3/uL — AB (ref 4.0–10.5)

## 2016-03-03 LAB — PROTIME-INR
INR: 1.07
PROTHROMBIN TIME: 13.9 s (ref 11.4–15.2)

## 2016-03-03 LAB — URINE MICROSCOPIC-ADD ON
Bacteria, UA: NONE SEEN
RBC / HPF: NONE SEEN RBC/hpf (ref 0–5)
SQUAMOUS EPITHELIAL / LPF: NONE SEEN
WBC UA: NONE SEEN WBC/hpf (ref 0–5)

## 2016-03-03 LAB — MAGNESIUM: MAGNESIUM: 2 mg/dL (ref 1.7–2.4)

## 2016-03-03 MED ORDER — ONDANSETRON HCL 4 MG PO TABS: 4.0000 mg | ORAL_TABLET | Freq: Four times a day (QID) | ORAL | Status: DC | PRN

## 2016-03-03 MED ORDER — RIVASTIGMINE 4.6 MG/24HR TD PT24
4.6000 mg | MEDICATED_PATCH | Freq: Every day | TRANSDERMAL | Status: DC
  Administered 2016-03-03 – 2016-03-05 (×3): 4.6 mg via TRANSDERMAL
  Filled 2016-03-03 (×3): qty 1

## 2016-03-03 MED ORDER — SODIUM CHLORIDE 0.9% FLUSH
3.0000 mL | Freq: Two times a day (BID) | INTRAVENOUS | Status: DC
  Administered 2016-03-03 – 2016-03-05 (×5): 3 mL via INTRAVENOUS

## 2016-03-03 MED ORDER — OMEGA-3-ACID ETHYL ESTERS 1 G PO CAPS
1000.0000 mg | ORAL_CAPSULE | Freq: Every day | ORAL | Status: DC
  Administered 2016-03-03: 1000 mg via ORAL
  Filled 2016-03-03: qty 1

## 2016-03-03 MED ORDER — ATORVASTATIN CALCIUM 40 MG PO TABS
40.0000 mg | ORAL_TABLET | Freq: Every day | ORAL | Status: DC
  Administered 2016-03-03 – 2016-03-05 (×2): 40 mg via ORAL
  Filled 2016-03-03 (×3): qty 1

## 2016-03-03 MED ORDER — PIPERACILLIN-TAZOBACTAM 3.375 G IVPB
3.3750 g | Freq: Three times a day (TID) | INTRAVENOUS | Status: DC
  Administered 2016-03-03 – 2016-03-04 (×5): 3.375 g via INTRAVENOUS
  Filled 2016-03-03 (×7): qty 50

## 2016-03-03 MED ORDER — POTASSIUM CHLORIDE CRYS ER 20 MEQ PO TBCR
40.0000 meq | EXTENDED_RELEASE_TABLET | ORAL | Status: AC
  Administered 2016-03-03 (×2): 40 meq via ORAL
  Filled 2016-03-03 (×2): qty 2

## 2016-03-03 MED ORDER — ONDANSETRON HCL 4 MG/2ML IJ SOLN: 4.0000 mg | Freq: Four times a day (QID) | INTRAMUSCULAR | Status: DC | PRN

## 2016-03-03 MED ORDER — ASPIRIN 81 MG PO CHEW
81.0000 mg | CHEWABLE_TABLET | Freq: Every day | ORAL | Status: DC
  Administered 2016-03-03 – 2016-03-05 (×3): 81 mg via ORAL
  Filled 2016-03-03 (×3): qty 1

## 2016-03-03 MED ORDER — DEXTROSE-NACL 5-0.45 % IV SOLN
INTRAVENOUS | Status: DC
  Administered 2016-03-03: 02:00:00 via INTRAVENOUS
  Administered 2016-03-04: 75 mL/h via INTRAVENOUS
  Administered 2016-03-04: 23:00:00 via INTRAVENOUS

## 2016-03-03 MED ORDER — VANCOMYCIN HCL IN DEXTROSE 1-5 GM/200ML-% IV SOLN
1000.0000 mg | Freq: Two times a day (BID) | INTRAVENOUS | Status: DC
  Administered 2016-03-03 – 2016-03-04 (×3): 1000 mg via INTRAVENOUS
  Filled 2016-03-03 (×4): qty 200

## 2016-03-03 MED ORDER — ATENOLOL 25 MG PO TABS
50.0000 mg | ORAL_TABLET | Freq: Every day | ORAL | Status: DC
  Administered 2016-03-04 – 2016-03-05 (×2): 50 mg via ORAL
  Filled 2016-03-03 (×2): qty 2

## 2016-03-03 MED ORDER — ACETAMINOPHEN 325 MG PO TABS
650.0000 mg | ORAL_TABLET | Freq: Four times a day (QID) | ORAL | Status: DC | PRN
  Administered 2016-03-03: 650 mg via ORAL
  Filled 2016-03-03: qty 2

## 2016-03-03 MED ORDER — FAMOTIDINE 20 MG PO TABS
20.0000 mg | ORAL_TABLET | Freq: Every day | ORAL | Status: DC
  Administered 2016-03-03 – 2016-03-05 (×3): 20 mg via ORAL
  Filled 2016-03-03 (×3): qty 1

## 2016-03-03 MED ORDER — VITAMIN D (ERGOCALCIFEROL) 1.25 MG (50000 UNIT) PO CAPS: 50000.0000 [IU] | ORAL_CAPSULE | ORAL | Status: DC

## 2016-03-03 MED ORDER — LORAZEPAM 2 MG/ML IJ SOLN
1.0000 mg | Freq: Four times a day (QID) | INTRAMUSCULAR | Status: DC | PRN
  Administered 2016-03-04: 1 mg via INTRAVENOUS
  Filled 2016-03-03: qty 1

## 2016-03-03 MED ORDER — PIPERACILLIN-TAZOBACTAM 3.375 G IVPB 30 MIN
3.3750 g | Freq: Once | INTRAVENOUS | Status: AC
  Administered 2016-03-03: 3.375 g via INTRAVENOUS
  Filled 2016-03-03: qty 50

## 2016-03-03 MED ORDER — VANCOMYCIN HCL IN DEXTROSE 1-5 GM/200ML-% IV SOLN
1000.0000 mg | Freq: Once | INTRAVENOUS | Status: AC
  Administered 2016-03-03: 1000 mg via INTRAVENOUS
  Filled 2016-03-03: qty 200

## 2016-03-03 MED ORDER — ENOXAPARIN SODIUM 40 MG/0.4ML ~~LOC~~ SOLN
40.0000 mg | SUBCUTANEOUS | Status: DC
  Administered 2016-03-03 – 2016-03-04 (×2): 40 mg via SUBCUTANEOUS
  Filled 2016-03-03 (×2): qty 0.4

## 2016-03-03 MED ORDER — ACETAMINOPHEN 650 MG RE SUPP: 650.0000 mg | Freq: Four times a day (QID) | RECTAL | Status: DC | PRN

## 2016-03-03 MED ORDER — POLYETHYLENE GLYCOL 3350 17 G PO PACK
17.0000 g | PACK | Freq: Every day | ORAL | Status: DC
Start: 2016-03-03 — End: 2016-03-05
  Administered 2016-03-03 – 2016-03-05 (×3): 17 g via ORAL
  Filled 2016-03-03 (×3): qty 1

## 2016-03-03 NOTE — Progress Notes (Signed)
PROGRESS NOTE    Helen Cabrera  [id number] DOB: 1955-10-18 DOA: 03/02/2016 PCP: Nadean Corwin, MD    Brief Narrative:    Assessment & Plan:   Principal Problem:   Seizures (HCC) Active Problems:   Hyperlipidemia   Essential hypertension   SDAT (senile dementia of Alzheimer's type)   Left arm weakness   History of depression   Seizure (HCC)    #1 seizures Patient had presented with seizures and currently on a seizure precaution. Patient also noted to have fevers. Chest x-ray was negative for any acute infiltrate. Blood cultures pending. Urinalysis unremarkable. Patient noted to be mildly hypokalemic with a potassium of 3.2. Magnesium levels within normal limits at 2.0. Ativan when necessary. Patient has been seen in consultation by neurology who recommended an MRI and EEG. Neurology following and appreciate input and recommendations.  #2 hypertension Continue atenolol.  #3 hyperlipidemia Continue Lipitor.  #4 history of depression Stable.  #5 left upper extremity paresis MRI head pending.  #6 fever Questionable etiology. Chest x-ray is negative for any acute abnormalities. Blood cultures have been obtained and are pending. Urinalysis is unremarkable. Urine cultures pending. Continue per cavity vancomycin and IV Zosyn. Follow.  #7 history of Alzheimer's dementia Continue Exelon patch. Neurology following.    DVT prophylaxis: Lovenox Code Status: Full Family Communication: Updated husband at bedside. Disposition Plan: Pending PT evaluation and further workup.   Consultants:   Neurology: Dr. Hilda Blades 03/03/2016  Procedures:   MRI pending  EEG pending  Chest x-ray 03/03/2016  Antimicrobials:   IV vancomycin 03/03/2016  IV Zosyn 03/03/2016   Subjective: Patient sleeping however easily arousable. Husband at bedside. Patient denies chest pain. No shortness of breath.  Objective: Vitals:   03/03/16 0231 03/03/16 0500 03/03/16 0543  03/03/16 0932  BP: (!) 115/57  124/64 107/78  Pulse: 95  88 81  Resp: 20  20 18   Temp: 98.2 F (36.8 C)  99.7 F (37.6 C) 98.8 F (37.1 C)  TempSrc: Oral  Axillary Axillary  SpO2: 97%  96% 96%  Weight:  76.5 kg (168 lb 9.6 oz)    Height:        Intake/Output Summary (Last 24 hours) at 03/03/16 1320 Last data filed at 03/03/16 0700  Gross per 24 hour  Intake           398.75 ml  Output                0 ml  Net           398.75 ml   Filed Weights   03/03/16 0000 03/03/16 0500  Weight: 75.6 kg (166 lb 10.7 oz) 76.5 kg (168 lb 9.6 oz)    Examination:  General exam: Appears calm and comfortable.Right eye blindness. Respiratory system: Clear to auscultation anterior lung fields. Respiratory effort normal. Cardiovascular system: S1 & S2 heard, RRR. No JVD, murmurs, rubs, gallops or clicks. No pedal edema. Gastrointestinal system: Abdomen is nondistended, soft and nontender. No organomegaly or masses felt. Normal bowel sounds heard. Central nervous system: Alert and oriented. Cogwheel rigidity left greater than right. No focal neurological deficits. Extremities: Symmetric 5 x 5 power. Skin: No rashes, lesions or ulcers Psychiatry: Judgement and insight appear normal. Mood & affect appropriate.     Data Reviewed: I have personally reviewed following labs and imaging studies  CBC:  Recent Labs Lab 03/03/16 0614  WBC 13.2*  HGB 13.2  HCT 40.6  MCV 89.4  PLT 207   Basic Metabolic Panel:  Recent Labs Lab 03/03/16 0614  NA 141  K 3.2*  CL 102  CO2 27  GLUCOSE 117*  BUN 10  CREATININE 0.90  CALCIUM 8.9  MG 2.0   GFR: Estimated Creatinine Clearance: 68 mL/min (by C-G formula based on SCr of 0.9 mg/dL). Liver Function Tests:  Recent Labs Lab 03/03/16 0614  AST 57*  ALT 23  ALKPHOS 83  BILITOT 1.3*  PROT 6.6  ALBUMIN 3.5   No results for input(s): LIPASE, AMYLASE in the last 168 hours. No results for input(s): AMMONIA in the last 168 hours. Coagulation  Profile:  Recent Labs Lab 03/03/16 0614  INR 1.07   Cardiac Enzymes: No results for input(s): CKTOTAL, CKMB, CKMBINDEX, TROPONINI in the last 168 hours. BNP (last 3 results) No results for input(s): PROBNP in the last 8760 hours. HbA1C: No results for input(s): HGBA1C in the last 72 hours. CBG: No results for input(s): GLUCAP in the last 168 hours. Lipid Profile: No results for input(s): CHOL, HDL, LDLCALC, TRIG, CHOLHDL, LDLDIRECT in the last 72 hours. Thyroid Function Tests: No results for input(s): TSH, T4TOTAL, FREET4, T3FREE, THYROIDAB in the last 72 hours. Anemia Panel: No results for input(s): VITAMINB12, FOLATE, FERRITIN, TIBC, IRON, RETICCTPCT in the last 72 hours. Sepsis Labs: No results for input(s): PROCALCITON, LATICACIDVEN in the last 168 hours.  No results found for this or any previous visit (from the past 240 hour(s)).       Radiology Studies: Dg Chest Port 1 View  Result Date: 03/03/2016 CLINICAL DATA:  Seizure and fever. EXAM: PORTABLE CHEST 1 VIEW COMPARISON:  03/31/2014 FINDINGS: Both lungs are clear. Heart and mediastinum are within normal limits. The trachea is midline. Negative for a pneumothorax. No acute bone abnormality. IMPRESSION: No active disease. Electronically Signed   By: Richarda Overlie M.D.   On: 03/03/2016 09:25        Scheduled Meds: . aspirin  81 mg Oral QHS  . atenolol  50 mg Oral Daily  . atorvastatin  40 mg Oral Daily  . enoxaparin (LOVENOX) injection  40 mg Subcutaneous Q24H  . famotidine  20 mg Oral QHS  . omega-3 acid ethyl esters  1,000 mg Oral QHS  . piperacillin-tazobactam (ZOSYN)  IV  3.375 g Intravenous Q8H  . polyethylene glycol  17 g Oral Daily  . potassium chloride  40 mEq Oral Q4H  . rivastigmine  4.6 mg Transdermal Daily  . sodium chloride flush  3 mL Intravenous Q12H  . vancomycin  1,000 mg Intravenous Q12H  . [START ON 03/06/2016] Vitamin D (Ergocalciferol)  50,000 Units Oral Q Mon   Continuous Infusions: .  dextrose 5 % and 0.45% NaCl 75 mL/hr at 03/03/16 0700     LOS: 1 day    Time spent: 35 minutes    Ozie Dimaria, MD [facility] Pager [phone]  If 7PM-7AM, please contact night-coverage [email or web address] Password [email or web address] 03/03/2016, 1:20 PM

## 2016-03-03 NOTE — Progress Notes (Signed)
EEG Completed; Results Pending  

## 2016-03-03 NOTE — Progress Notes (Signed)
Mri attempted pt moving trying to get out of bed and yelling, notified rn, pt sent back to floor

## 2016-03-03 NOTE — Consult Note (Signed)
Initial Neurological Consultation                      NEURO HOSPITALIST CONSULT NOTE   Requestig physician: Dr. Grandville Silos   Reason for Consult:  History of recent seizure.   HPI:                                                                                                                                          Helen Cabrera is an 60 y.o. female with a known history of advanced dementia who presents with a seizure that occurred yesterday. The history is provided by Helen Cabrera's husband. He reports that yesterday they were at a court appointment in [place] when Helen Cabrera suddenly lost consciousness and fell to the ground.  He reports she was stiff with some brief twitching movements.  He thinks this lasted for a few minutes.  There was no tongue biting but she did experience some urinary incontinence.  Afterwards she was somewhat confused and disoriented.  It appears Helen Cabrera was seen at [facility] several years ago and was diagnosed with some form of dementia. However, her husband does not know specifically which type of dementia she has. Her exam was notable for the presence of bilateral cogwheeling and rigidity her husband notes decreased use of the left upper extremity. He notes that the left upper extremity sometimes experiences some brief jerking movements. He has noted some stiffness in her left upper extremity.  He reports she has significant difficulties with sleep and sometimes acts out her dreams. The dementia is advanced in nature and Helen Cabrera requires assistance with all activities of daily living. She is able to feed herself. She is able to follow some simple basic commands. She has also experienced some hallucinations but this was associated with some medications that she was taking. She has an unsteady gait with short steps. She also experiences some lightheadedness with standing. He has observed occasional tremor.    Past Medical History:  Diagnosis Date  . Anemia    Iron  def   . Bacterial overgrowth syndrome   . Chronic headaches   . Dementia   . Depression   . Diverticulosis of colon (without mention of hemorrhage)   . Family history of malignant neoplasm of gastrointestinal tract   . GERD (gastroesophageal reflux disease)   . Headache(784.0)   . Hypercholesterolemia   . Hypertension   . IBS (irritable bowel syndrome)   . Osteoarthritis   . Pancreatitis   . Unspecified gastritis and gastroduodenitis without mention of hemorrhage   . Vitamin B12 deficiency     Past Surgical History:  Procedure Laterality Date  . ABDOMINAL HYSTERECTOMY    . APPENDECTOMY    . CESAREAN SECTION     x 4  . CESAREAN SECTION     x4  . PARS PLANA VITRECTOMY Right 04/02/2014   Procedure: RIGHT PARS  PLANA VITRECTOMY WITH 25 GAUGE/ENDO LASER/LENSECTOMY/INSERTION OF SILICONE OIL,REPAIR COMPLEX RETINAL DETACHMENT,MEMBRANE PEEL, IRIDECTOMY;  Surgeon: Hurman Horn, MD;  Location: [facility];  Service: Ophthalmology;  Laterality: Right;    MEDICATIONS:                                                                                                                     I have reviewed the patient's current medications.  Allergies  Allergen Reactions  . Amitriptyline Other (See Comments)    dysphoria  . Carafate [Sucralfate] Other (See Comments)    constipation  . Amitriptyline Other (See Comments)    dysphoria  . Carafate [Sucralfate] Other (See Comments)    constipation  . Codeine Other (See Comments)    Insomnia; agitation; restlessness   . Lexapro [Escitalopram Oxalate] Other (See Comments)    Insomnia; agitation; restlessness   . Paxil [Paroxetine Hcl] Other (See Comments)    Insomnia; agitation; restlessness   . Prednisone Other (See Comments)    Unknown reaction  . Promethazine Nausea Only  . Zoloft [Sertraline Hcl] Other (See Comments)    Insomnia; agitation; restlessness   . Codeine Other (See Comments)    Insomnia; agitation; restlessness   . Lexapro  [Escitalopram Oxalate] Other (See Comments)    Insomnia; agitation; restlessness   . Paxil [Paroxetine Hcl] Other (See Comments)    Insomnia; agitation; restlessness   . Prednisone Other (See Comments)    unknown  . Promethazine Hcl Nausea Only  . Zoloft [Sertraline Hcl] Other (See Comments)    Insomnia; agitation; restlessness      Social History:  reports that she has never smoked. She does not have any smokeless tobacco history on file. She reports that she drinks alcohol. She reports that she does not use drugs.  Family History  Problem Relation Age of Onset  . Diabetes Mother   . Alcohol abuse Father   . Cirrhosis Father   . Breast cancer Maternal Aunt   . Colon cancer Maternal Aunt   . Stomach cancer Brother   . Esophageal cancer Brother   . Diabetes Brother   . Diabetes Maternal Uncle   . Heart disease      grandfather/grandmother     ROS:  History obtained from chart review  General ROS: negative for - chills, fatigue, fever, night sweats, weight gain or weight loss Psychological ROS: negative for - behavioral disorder, hallucinations, memory difficulties, mood swings or suicidal ideation Ophthalmic ROS: negative for - blurry vision, double vision, eye pain or loss of vision ENT ROS: negative for - epistaxis, nasal discharge, oral lesions, sore throat, tinnitus or vertigo Allergy and Immunology ROS: negative for - hives or itchy/watery eyes Hematological and Lymphatic ROS: negative for - bleeding problems, bruising or swollen lymph nodes Endocrine ROS: negative for - galactorrhea, hair pattern changes, polydipsia/polyuria or temperature intolerance Respiratory ROS: negative for - cough, hemoptysis, shortness of breath or wheezing Cardiovascular ROS: negative for - chest pain, dyspnea on exertion, edema or irregular  heartbeat Gastrointestinal ROS: negative for - abdominal pain, diarrhea, hematemesis, nausea/vomiting or stool incontinence Genito-Urinary ROS: negative for - dysuria, hematuria, incontinence or urinary frequency/urgency Musculoskeletal ROS: negative for - joint swelling or muscular weakness Neurological ROS: as noted in HPI Dermatological ROS: negative for rash and skin lesion changes   General Exam                                                                                                      Blood pressure 107/78, pulse 81, temperature 98.8 F (37.1 C), temperature source Axillary, resp. rate 18, height 5\' 5"  (1.651 m), weight 76.5 kg (168 lb 9.6 oz), SpO2 96 %. HEENT-  Normocephalic, no lesions, without obvious abnormality.  Normal external eye and conjunctiva.  Normal TM's bilaterally.  Normal auditory canals and external ears. Normal external nose, mucus membranes and septum.  Normal pharynx. Cardiovascular- regular rate and rhythm, S1, S2 normal, no murmur, click, rub or gallop, pulses palpable throughout   Lungs- chest clear, no wheezing, rales, normal symmetric air entry, Heart exam - S1, S2 normal, no murmur, no gallop, rate regular Abdomen- soft, non-tender; bowel sounds normal; no masses,  no organomegaly Extremities- less then 2 second capillary refill Lymph-no adenopathy palpable Musculoskeletal-no joint tenderness, deformity or swelling Skin-warm and dry, no hyperpigmentation, vitiligo, or suspicious lesions  Neurological Examination Mental Status: The patient is lethargic but arousable and follows commands by showing 2 fingers bilaterally. Cranial Nerves: The pupils are unequal with the right being slightly larger than the left. The pupils are irregular due to prior surgical intervention. Motor: Antigravity in all 4 extremities. Tone is increased bilaterally with cogwheeling and rigidity, significantly more notable in the left upper extremity.  Sensory: Grossly  present Deep Tendon Reflexes: 1-2+ bilaterally Plantars: Right: downgoing   Left: downgoing Cerebellar: Difficult to assess due to cognitive function. There is decreased coordination in the left upper extremity. Gait: Not tested.      Lab Results: Basic Metabolic Panel:  Recent Labs Lab 03/03/16 0614  NA 141  K 3.2*  CL 102  CO2 27  GLUCOSE 117*  BUN 10  CREATININE 0.90  CALCIUM 8.9    Liver Function Tests:  Recent Labs Lab 03/03/16 0614  AST 57*  ALT 23  ALKPHOS 83  BILITOT 1.3*  PROT 6.6  ALBUMIN 3.5  No results for input(s): LIPASE, AMYLASE in the last 168 hours. No results for input(s): AMMONIA in the last 168 hours.  CBC:  Recent Labs Lab 03/03/16 0614  WBC 13.2*  HGB 13.2  HCT 40.6  MCV 89.4  PLT 207    Cardiac Enzymes: No results for input(s): CKTOTAL, CKMB, CKMBINDEX, TROPONINI in the last 168 hours.  Lipid Panel: No results for input(s): CHOL, TRIG, HDL, CHOLHDL, VLDL, LDLCALC in the last 168 hours.  CBG: No results for input(s): GLUCAP in the last 168 hours.  Microbiology: Results for orders placed or performed in visit on 02/01/15  Urine culture     Status: None   Collection Time: 02/02/15  8:15 AM  Result Value Ref Range Status   Colony Count 25,000 COLONIES/ML  Final   Organism ID, Bacteria Multiple bacterial morphotypes present, none  Final   Organism ID, Bacteria predominant. Suggest appropriate recollection if   Final   Organism ID, Bacteria clinically indicated.  Final    Coagulation Studies:  Recent Labs  03/03/16 0614  LABPROT 13.9  INR 1.07    Imaging: Dg Chest Port 1 View  Result Date: 03/03/2016 CLINICAL DATA:  Seizure and fever. EXAM: PORTABLE CHEST 1 VIEW COMPARISON:  03/31/2014 FINDINGS: Both lungs are clear. Heart and mediastinum are within normal limits. The trachea is midline. Negative for a pneumothorax. No acute bone abnormality. IMPRESSION: No active disease. Electronically Signed   By: Markus Daft  M.D.   On: 03/03/2016 09:25    Assessment/Plan:  Elysia is a 60 year old patient with a known history of advanced early-onset dementia. The exact type of dementia is unknown. She has a family history of early onset dementia as well. She has been admitted to the hospital for an episode suspicious for seizure activity that occurred yesterday. It does not appear she has had a recent MRI or EEG. She otherwise appears back to her baseline at this point.  Her exam is notable for a number of parkinsonian features. She has cogwheeling and rigidity that is particularly notable in the left upper extremity. She also experiences some myoclonic jerks in the left upper extremity as well. A neurodegenerative condition is suspected. Consideration is also given to parkinsonian syndromes such as the multiple system atrophies. MRI of the brain will be useful to rule out ischemic change, mass lesions and hemorrhage.  Plan:  1. MRI of brain and EEG.  2. The patient is noted to be high fall risk.    Mateo Overbeck A. Tasia Catchings, M.D. Neurohospitalist Phone: [phone]  03/03/2016, 9:45 AM

## 2016-03-03 NOTE — Care Management Note (Signed)
Case Management Note  Patient Details  Name: Helen Cabrera MRN: [id number] Date of Birth: Jan 01, 1956  Subjective/Objective:   Pt admitted with seizures. She has dementia and is from home with her spouse.                  Action/Plan: Medical work up and treatment. CM following for d/c needs.   Expected Discharge Date:                  Expected Discharge Plan:     In-House Referral:     Discharge planning Services     Post Acute Care Choice:    Choice offered to:     DME Arranged:    DME Agency:     HH Arranged:    HH Agency:     Status of Service:  In process, will continue to follow  If discussed at Long Length of Stay Meetings, dates discussed:    Additional Comments:  Pollie Friar, RN 03/03/2016, 2:24 PM

## 2016-03-03 NOTE — Progress Notes (Addendum)
Pharmacy Antibiotic Note  Helen Cabrera is a 60 y.o. female admitted on 03/02/2016 with fever s/p sz.  Pharmacy has been consulted for vancomycin and Zosyn dosing.  Plan: Vancomycin 1000mg  IV every 12 hours.  Goal trough 15-20 mcg/mL. Zosyn 3.375g IV q8h (4 hour infusion).  Height: 5\' 5"  (165.1 cm) Weight: 166 lb 10.7 oz (75.6 kg) IBW/kg (Calculated) : 57  Temp (24hrs), Avg:101.2 F (38.4 C), Min:101.2 F (38.4 C), Max:101.2 F (38.4 C)   Allergies  Allergen Reactions  . Amitriptyline Other (See Comments)    dysphoria  . Carafate [Sucralfate] Other (See Comments)    constipation  . Amitriptyline Other (See Comments)    dysphoria  . Carafate [Sucralfate] Other (See Comments)    constipation  . Codeine Other (See Comments)    Insomnia; agitation; restlessness   . Lexapro [Escitalopram Oxalate] Other (See Comments)    Insomnia; agitation; restlessness   . Paxil [Paroxetine Hcl] Other (See Comments)    Insomnia; agitation; restlessness   . Prednisone Other (See Comments)    Unknown reaction  . Promethazine Nausea Only  . Zoloft [Sertraline Hcl] Other (See Comments)    Insomnia; agitation; restlessness   . Codeine Other (See Comments)    Insomnia; agitation; restlessness   . Lexapro [Escitalopram Oxalate] Other (See Comments)    Insomnia; agitation; restlessness   . Paxil [Paroxetine Hcl] Other (See Comments)    Insomnia; agitation; restlessness   . Prednisone Other (See Comments)    unknown  . Promethazine Hcl Nausea Only  . Zoloft [Sertraline Hcl] Other (See Comments)    Insomnia; agitation; restlessness      Thank you for allowing pharmacy to be a part of this patient's care.  Wynona Neat, PharmD, BCPS  03/03/2016 1:14 AM

## 2016-03-04 ENCOUNTER — Inpatient Hospital Stay (HOSPITAL_COMMUNITY): Payer: Medicare Other

## 2016-03-04 LAB — BASIC METABOLIC PANEL
ANION GAP: 11 (ref 5–15)
BUN: 13 mg/dL (ref 6–20)
CHLORIDE: 105 mmol/L (ref 101–111)
CO2: 22 mmol/L (ref 22–32)
Calcium: 8.8 mg/dL — ABNORMAL LOW (ref 8.9–10.3)
Creatinine, Ser: 1.03 mg/dL — ABNORMAL HIGH (ref 0.44–1.00)
GFR calc Af Amer: >60 mL/min (ref >60)
GFR calc non Af Amer: 58 mL/min — ABNORMAL LOW (ref >60)
GLUCOSE: 164 mg/dL — AB (ref 65–99)
POTASSIUM: 4 mmol/L (ref 3.5–5.1)
Sodium: 138 mmol/L (ref 135–145)

## 2016-03-04 LAB — CBC
HEMATOCRIT: 39.6 % (ref 36.0–46.0)
HEMOGLOBIN: 12.9 g/dL (ref 12.0–15.0)
MCH: 29.1 pg (ref 26.0–34.0)
MCHC: 32.6 g/dL (ref 30.0–36.0)
MCV: 89.4 fL (ref 78.0–100.0)
Platelets: 183 10*3/uL (ref 150–400)
RBC: 4.43 MIL/uL (ref 3.87–5.11)
RDW: 13 % (ref 11.5–15.5)
WBC: 10.2 10*3/uL (ref 4.0–10.5)

## 2016-03-04 LAB — URINE CULTURE: Culture: NO GROWTH

## 2016-03-04 LAB — MAGNESIUM: Magnesium: 2 mg/dL (ref 1.7–2.4)

## 2016-03-04 NOTE — Progress Notes (Signed)
PROGRESS NOTE    Helen Cabrera  ZOX:[id number] DOB: 1956/02/21 DOA: 03/02/2016 PCP: Nadean Corwin, MD   Assessment & Plan:   Principal Problem:   Seizures (HCC) Active Problems:   Hyperlipidemia   Essential hypertension   SDAT (senile dementia of Alzheimer's type)   Left arm weakness   History of depression   Seizure (HCC)   Fever    #1 seizures Patient had presented with seizures and currently on a seizure precaution. Patient also noted to have fevers. Chest x-ray was negative for any acute infiltrate. Blood cultures pending. Urinalysis unremarkable. Patient noted to be mildly hypokalemic with a potassium of 3.2. Magnesium levels within normal limits at 2.0. Ativan when necessary. Patient has been seen in consultation by neurology who recommended an MRI and EEG. MRI with no significant abnormalities. EEG showed a mildly abnormal EEG due to presence of mild posterior background slowing, which may be indicated above and mild encephalopathy. Neurology following patient and feel as patient is back to baseline may follow-up with outpatient neurologist. Per neurology no need to initiate any AED at this time as this is the only event she has experienced. Follow.   #2 hypertension Continue atenolol.  #3 hyperlipidemia Continue Lipitor.  #4 history of depression Stable.  #5 left upper extremity paresis MRI head with no significant abnormalities although motion degraded..  #6 fever Questionable etiology. Currently afebrile. Chest x-ray is negative for any acute abnormalities. Blood cultures have been obtained with no growth to date. Urinalysis is unremarkable. Urine cultures negative. Discontinue IV antibiotics and monitor off antibiotics. Follow.  #7 history of Alzheimer's dementia Continue Exelon patch. Neurology following.    DVT prophylaxis: Lovenox Code Status: Full Family Communication: Updated husband at bedside. Disposition Plan: Hopefully home tomorrow with  home health.   Consultants:   Neurology: Dr. Hilda Blades 03/03/2016  Procedures:   MRI 03/04/2016  EEG 03/03/2016  Chest x-ray 03/03/2016  Antimicrobials:   IV vancomycin 03/03/2016>>>>>> 03/04/2016  IV Zosyn 03/03/2016>>>>>> 03/04/2016   Subjective: Patient drowsy post MRI. No further seizure-like episodes. Husband at bedside.   Objective: Vitals:   03/04/16 0101 03/04/16 0700 03/04/16 0915 03/04/16 1325  BP: 115/67 (!) 118/58 129/83 (!) 100/51  Pulse: 79 67 88 70  Resp: 18 20 18 18   Temp: 99.6 F (37.6 C) 98.7 F (37.1 C) 98 F (36.7 C) 98.5 F (36.9 C)  TempSrc: Axillary Axillary Oral Oral  SpO2: 96% 96% 96% 98%  Weight:      Height:        Intake/Output Summary (Last 24 hours) at 03/04/16 1551 Last data filed at 03/04/16 0700  Gross per 24 hour  Intake             2110 ml  Output                0 ml  Net             2110 ml   Filed Weights   03/03/16 0000 03/03/16 0500  Weight: 75.6 kg (166 lb 10.7 oz) 76.5 kg (168 lb 9.6 oz)    Examination:  General exam: Patient drowsy post MRI..Right eye blindness. Respiratory system: Clear to auscultation anterior lung fields. Respiratory effort normal. Cardiovascular system: S1 & S2 heard, RRR. No JVD, murmurs, rubs, gallops or clicks. No pedal edema. Gastrointestinal system: Abdomen is nondistended, soft and nontender. No organomegaly or masses felt. Normal bowel sounds heard. Central nervous system: Alert and oriented. Cogwheel rigidity left greater than right. No focal neurological  deficits. Extremities: Symmetric 5 x 5 power. Skin: No rashes, lesions or ulcers Psychiatry: Judgement and insight appear normal. Mood & affect appropriate.     Data Reviewed: I have personally reviewed following labs and imaging studies  CBC:  Recent Labs Lab 03/03/16 0614 03/04/16 0521  WBC 13.2* 10.2  HGB 13.2 12.9  HCT 40.6 39.6  MCV 89.4 89.4  PLT 207 183   Basic Metabolic Panel:  Recent Labs Lab  03/03/16 0614 03/04/16 0521 03/04/16 0920  NA 141  --  138  K 3.2*  --  4.0  CL 102  --  105  CO2 27  --  22  GLUCOSE 117*  --  164*  BUN 10  --  13  CREATININE 0.90  --  1.03*  CALCIUM 8.9  --  8.8*  MG 2.0 2.0  --    GFR: Estimated Creatinine Clearance: 59.4 mL/min (by C-G formula based on SCr of 1.03 mg/dL). Liver Function Tests:  Recent Labs Lab 03/03/16 0614  AST 57*  ALT 23  ALKPHOS 83  BILITOT 1.3*  PROT 6.6  ALBUMIN 3.5   No results for input(s): LIPASE, AMYLASE in the last 168 hours. No results for input(s): AMMONIA in the last 168 hours. Coagulation Profile:  Recent Labs Lab 03/03/16 0614  INR 1.07   Cardiac Enzymes: No results for input(s): CKTOTAL, CKMB, CKMBINDEX, TROPONINI in the last 168 hours. BNP (last 3 results) No results for input(s): PROBNP in the last 8760 hours. HbA1C: No results for input(s): HGBA1C in the last 72 hours. CBG: No results for input(s): GLUCAP in the last 168 hours. Lipid Profile: No results for input(s): CHOL, HDL, LDLCALC, TRIG, CHOLHDL, LDLDIRECT in the last 72 hours. Thyroid Function Tests: No results for input(s): TSH, T4TOTAL, FREET4, T3FREE, THYROIDAB in the last 72 hours. Anemia Panel: No results for input(s): VITAMINB12, FOLATE, FERRITIN, TIBC, IRON, RETICCTPCT in the last 72 hours. Sepsis Labs: No results for input(s): PROCALCITON, LATICACIDVEN in the last 168 hours.  Recent Results (from the past 240 hour(s))  Culture, blood (Routine X 2) w Reflex to ID Panel     Status: None (Preliminary result)   Collection Time: 03/03/16  1:15 AM  Result Value Ref Range Status   Specimen Description BLOOD RIGHT ANTECUBITAL  Final   Special Requests BOTTLES DRAWN AEROBIC AND ANAEROBIC 5CC   Final   Culture NO GROWTH 1 DAY  Final   Report Status PENDING  Incomplete  Culture, blood (Routine X 2) w Reflex to ID Panel     Status: None (Preliminary result)   Collection Time: 03/03/16  1:22 AM  Result Value Ref Range Status    Specimen Description BLOOD LEFT ANTECUBITAL  Final   Special Requests BOTTLES DRAWN AEROBIC AND ANAEROBIC 3CC   Final   Culture NO GROWTH 1 DAY  Final   Report Status PENDING  Incomplete  Urine culture     Status: None   Collection Time: 03/03/16 11:58 AM  Result Value Ref Range Status   Specimen Description URINE, CATHETERIZED  Final   Special Requests NONE  Final   Culture NO GROWTH  Final   Report Status 03/04/2016 FINAL  Final         Radiology Studies: Mr Brain Wo Contrast  Result Date: 03/04/2016 CLINICAL DATA:  Seizure EXAM: MRI HEAD WITHOUT CONTRAST TECHNIQUE: Multiplanar, multiecho pulse sequences of the brain and surrounding structures were obtained without intravenous contrast. COMPARISON:  None. FINDINGS: Image quality degraded by significant motion. The patient  was not able to complete the study Mild atrophy.  Mild ventricular enlargement. Negative for acute infarct. No significant chronic ischemic changes. Negative for mass or fluid collection. No shift of the midline structures Fluid level in the right globe consent compatible with intra-ocular hemorrhage. Left globe normal. Paranasal sinuses clear. IMPRESSION: Incomplete study common degraded by significant motion. No acute intracranial abnormality Intra-ocular hemorrhage on the right. Electronically Signed   By: Marlan Palau M.D.   On: 03/04/2016 12:10   Dg Chest Port 1 View  Result Date: 03/03/2016 CLINICAL DATA:  Seizure and fever. EXAM: PORTABLE CHEST 1 VIEW COMPARISON:  03/31/2014 FINDINGS: Both lungs are clear. Heart and mediastinum are within normal limits. The trachea is midline. Negative for a pneumothorax. No acute bone abnormality. IMPRESSION: No active disease. Electronically Signed   By: Richarda Overlie M.D.   On: 03/03/2016 09:25        Scheduled Meds: . aspirin  81 mg Oral QHS  . atenolol  50 mg Oral Daily  . atorvastatin  40 mg Oral Daily  . enoxaparin (LOVENOX) injection  40 mg Subcutaneous Q24H  .  famotidine  20 mg Oral QHS  . omega-3 acid ethyl esters  1,000 mg Oral QHS  . piperacillin-tazobactam (ZOSYN)  IV  3.375 g Intravenous Q8H  . polyethylene glycol  17 g Oral Daily  . rivastigmine  4.6 mg Transdermal Daily  . sodium chloride flush  3 mL Intravenous Q12H  . vancomycin  1,000 mg Intravenous Q12H  . [START ON 03/06/2016] Vitamin D (Ergocalciferol)  50,000 Units Oral Q Mon   Continuous Infusions: . dextrose 5 % and 0.45% NaCl 75 mL/hr (03/04/16 0024)     LOS: 2 days    Time spent: 35 minutes    Lestat Golob, MD [facility] Pager [phone]  If 7PM-7AM, please contact night-coverage [email or web address] Password [email or web address] 03/04/2016, 3:51 PM
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
## 2016-03-04 NOTE — Progress Notes (Signed)
Subjective:  Helen Cabrera is resting comfortably in bed this morning. She reports no further seizure activity. She appears back to her baseline at this time. She has a known history of dementia and was noted to have features of Parkinson's disease on examination yesterday. This baseline status appears stable at this time. Her neuro imaging was completed and was normal. Her EEG revealed presence of mild posterior background slowing. This is a nonspecific finding and is consistent with mild encephalopathy.  Exam: Vitals:   03/04/16 0915 03/04/16 1325  BP: 129/83 (!) 100/51  Pulse: 88 70  Resp: 18 18  Temp: 98 F (36.7 C) 98.5 F (36.9 C)    HEENT-  Normocephalic, no lesions, without obvious abnormality.  Normal external eye and conjunctiva.  Normal TM's bilaterally.  Normal auditory canals and external ears. Normal external nose, mucus membranes and septum.  Normal pharynx. Cardiovascular- regular rate and rhythm, S1, S2 normal, no murmur, click, rub or gallop, pulses palpable throughout   Lungs- chest clear, no wheezing, rales, normal symmetric air entry, Heart exam - S1, S2 normal, no murmur, no gallop, rate regular Abdomen- soft, non-tender; bowel sounds normal; no masses,  no organomegaly Extremities- less then 2 second capillary refill Lymph-no adenopathy palpable Musculoskeletal-no joint tenderness, deformity or swelling Skin-warm and dry, no hyperpigmentation, vitiligo, or suspicious lesions    Gen: In bed, NAD MS: Baseline confusion. Otherwise awake and responsive. CN: Cranial nerves II through XII are intact. Motor: Motor exam is antigravity in all 4 extremities. Sensory: Sensation is grossly intact. DTR: 1-2+ bilaterally.  Pertinent Labs/Diagnostics: Reviewed.    Impression:   Helen Cabrera appears back to her baseline today. Her EEG reveals mild posterior background slowing consistent with her known history of dementia. The EEG revealed no epileptiform discharges. The neuro  imaging was unremarkable. One possibility is that this presentation could represent syncope followed by some post-syncopal ictal movements as opposed to an epileptic seizure. Should she continue to experience events of this nature additional outpatient workup with 72 hour video ambulatory EEG might be of additional benefit. There is no need to initiate antiepileptic medications given that this is the only event she has experienced.     Recommendations:  1. Helen Cabrera appears back to her baseline at this time. It would be reasonable for her to follow-up with an outpatient neurologist.  2. Helen Cabrera is now cleared for discharge from a neurological perspective once cleared by the medicine service.  3. Neurology will sign off at this point. Please reconsult with any new issues.   Ryson Bacha A. Tasia Catchings, M.D. Neurohospitalist Phone: [phone]   03/04/2016, 1:56 PM
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
## 2016-03-04 NOTE — CV Procedure (Signed)
History: Helen Cabrera is a 60 year old patient with a history of an event suspicious for possible seizure activity.  Sedation: None  Technique: This is a 21 channel routine scalp EEG performed at the bedside with bipolar and monopolar montages arranged in accordance to the international 10/20 system of electrode placement. One channel was dedicated to EKG recording.    Background: The background consists of intermixed alpha and beta activities. There is a well defined posterior dominant rhythm of 6-7 Hz that attenuates with eye opening. Sleep is recorded with normal appearing structures.   Photic stimulation: Not performed.  EEG Abnormalities: Mild posterior background slowing was observed  Clinical Interpretation: This was a mildly abnormal EEG due to the presence of mild posterior background slowing. The presence of mild posterior background slowing is a nonspecific finding and is indicative of mild encephalopathy.   Dr. Gerda Diss. Tasia Catchings, MD Neurohospitalist

## 2016-03-05 LAB — BASIC METABOLIC PANEL
ANION GAP: 9 (ref 5–15)
BUN: 10 mg/dL (ref 6–20)
CALCIUM: 8.7 mg/dL — AB (ref 8.9–10.3)
CO2: 24 mmol/L (ref 22–32)
Chloride: 105 mmol/L (ref 101–111)
Creatinine, Ser: 1.29 mg/dL — ABNORMAL HIGH (ref 0.44–1.00)
GFR, EST AFRICAN AMERICAN: 51 mL/min — AB (ref >60)
GFR, EST NON AFRICAN AMERICAN: 44 mL/min — AB (ref >60)
GLUCOSE: 119 mg/dL — AB (ref 65–99)
POTASSIUM: 3.7 mmol/L (ref 3.5–5.1)
SODIUM: 138 mmol/L (ref 135–145)

## 2016-03-05 LAB — CBC
HCT: 40.6 % (ref 36.0–46.0)
Hemoglobin: 13.2 g/dL (ref 12.0–15.0)
MCH: 28.9 pg (ref 26.0–34.0)
MCHC: 32.5 g/dL (ref 30.0–36.0)
MCV: 88.8 fL (ref 78.0–100.0)
PLATELETS: 195 10*3/uL (ref 150–400)
RBC: 4.57 MIL/uL (ref 3.87–5.11)
RDW: 13.1 % (ref 11.5–15.5)
WBC: 10.7 10*3/uL — AB (ref 4.0–10.5)

## 2016-03-05 NOTE — Progress Notes (Signed)
Discharge orders received.  Discharge instructions and follow-up appointments reviewed with the patient and her husband.  VSS upon discharge.  IV removed and education complete.  All belongings sent with the patient.  Transported out via wheelchair.   Tarl Cephas M, RN 

## 2016-03-05 NOTE — Care Management Note (Signed)
Case Management Note  Patient Details  Name: Helen Cabrera MRN: [id number] Date of Birth: [birth year]  Subjective/Objective:                  Seizure-like activity [facility]) Action/Plan: Discharge planning Expected Discharge Date:  03/05/16              Expected Discharge Plan:  [place]  In-House Referral:     Discharge planning Services  CM Consult  Post Acute Care Choice:  Home Health Choice offered to:  Spouse, Patient  DME Arranged:  3-N-1, Walker rolling DME Agency:  [place] Arranged:  PT, OT [facility] Agency:  [place]  Status of Service:  Completed, signed off  If discussed at [place] of Stay Meetings, dates discussed:    Additional Comments: CM met with pt and spouse of pt to offer choice of home health agency.  Family chooses [facility] to render HHPT/OT.  Referral called to [facility] rep, Tiffany.  CM notified [facility] [employer] rep, Reggie to please deliver the rolling walker and 3n1 so pt can discharge.  No other CM needs were communicated. Dellie Catholic, RN 03/05/2016, 12:04 PM
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
## 2016-03-06 DIAGNOSIS — F0280 Dementia in other diseases classified elsewhere without behavioral disturbance: Secondary | ICD-10-CM | POA: Diagnosis not present

## 2016-03-06 DIAGNOSIS — K589 Irritable bowel syndrome without diarrhea: Secondary | ICD-10-CM | POA: Diagnosis not present

## 2016-03-06 DIAGNOSIS — M6281 Muscle weakness (generalized): Secondary | ICD-10-CM | POA: Diagnosis not present

## 2016-03-06 DIAGNOSIS — R569 Unspecified convulsions: Secondary | ICD-10-CM | POA: Diagnosis not present

## 2016-03-06 DIAGNOSIS — E785 Hyperlipidemia, unspecified: Secondary | ICD-10-CM | POA: Diagnosis not present

## 2016-03-06 DIAGNOSIS — Z9181 History of falling: Secondary | ICD-10-CM | POA: Diagnosis not present

## 2016-03-06 DIAGNOSIS — I10 Essential (primary) hypertension: Secondary | ICD-10-CM | POA: Diagnosis not present

## 2016-03-06 DIAGNOSIS — F329 Major depressive disorder, single episode, unspecified: Secondary | ICD-10-CM | POA: Diagnosis not present

## 2016-03-06 DIAGNOSIS — K5790 Diverticulosis of intestine, part unspecified, without perforation or abscess without bleeding: Secondary | ICD-10-CM | POA: Diagnosis not present

## 2016-03-06 DIAGNOSIS — G309 Alzheimer's disease, unspecified: Secondary | ICD-10-CM | POA: Diagnosis not present

## 2016-03-06 DIAGNOSIS — D649 Anemia, unspecified: Secondary | ICD-10-CM | POA: Diagnosis not present

## 2016-03-06 DIAGNOSIS — M1990 Unspecified osteoarthritis, unspecified site: Secondary | ICD-10-CM | POA: Diagnosis not present

## 2016-03-07 ENCOUNTER — Ambulatory Visit (HOSPITAL_COMMUNITY)
Admission: RE | Admit: 2016-03-07 | Discharge: 2016-03-07 | Disposition: A | Discharge status: Home / Self Care | Payer: Medicare Other | Source: Ambulatory Visit | Attending: Internal Medicine | Admitting: Internal Medicine

## 2016-03-07 ENCOUNTER — Encounter: Payer: Self-pay | Admitting: Internal Medicine

## 2016-03-07 ENCOUNTER — Ambulatory Visit (INDEPENDENT_AMBULATORY_CARE_PROVIDER_SITE_OTHER): Payer: Medicare Other | Admitting: Internal Medicine

## 2016-03-07 VITALS — BP 106/60 | HR 64 | Temp 98.2°F | Resp 16 | Ht 65.0 in | Wt 160.0 lb

## 2016-03-07 DIAGNOSIS — M545 Low back pain: Secondary | ICD-10-CM | POA: Diagnosis not present

## 2016-03-07 DIAGNOSIS — F03918 Unspecified dementia, unspecified severity, with other behavioral disturbance: Secondary | ICD-10-CM

## 2016-03-07 DIAGNOSIS — R569 Unspecified convulsions: Secondary | ICD-10-CM | POA: Diagnosis not present

## 2016-03-07 DIAGNOSIS — G253 Myoclonus: Secondary | ICD-10-CM | POA: Diagnosis not present

## 2016-03-07 DIAGNOSIS — M5489 Other dorsalgia: Secondary | ICD-10-CM

## 2016-03-07 DIAGNOSIS — M546 Pain in thoracic spine: Secondary | ICD-10-CM | POA: Diagnosis not present

## 2016-03-07 DIAGNOSIS — F0391 Unspecified dementia with behavioral disturbance: Secondary | ICD-10-CM | POA: Diagnosis not present
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
## 2016-03-08 ENCOUNTER — Telehealth: Payer: Self-pay | Admitting: *Deleted

## 2016-03-08 LAB — CULTURE, BLOOD (ROUTINE X 2)
CULTURE: NO GROWTH
CULTURE: NO GROWTH

## 2016-03-08 NOTE — Telephone Encounter (Signed)
Darnell with [facility] [id number] called requesting a verbal for OT for left arm to work on mobility and strength.  Per Starlyn Skeans, PA-C, patient was evaluated yesterday 03/07/16 in our office and OT would greatly benefit patient.  A verbal ok was provided to [facility] to start OT.  Direct, written orders will be faxed from [facility] for a signature.

## 2016-03-13 DIAGNOSIS — G309 Alzheimer's disease, unspecified: Secondary | ICD-10-CM | POA: Diagnosis not present

## 2016-03-13 DIAGNOSIS — K589 Irritable bowel syndrome without diarrhea: Secondary | ICD-10-CM | POA: Diagnosis not present

## 2016-03-13 DIAGNOSIS — M6281 Muscle weakness (generalized): Secondary | ICD-10-CM | POA: Diagnosis not present

## 2016-03-13 DIAGNOSIS — F0280 Dementia in other diseases classified elsewhere without behavioral disturbance: Secondary | ICD-10-CM | POA: Diagnosis not present

## 2016-03-13 DIAGNOSIS — K5790 Diverticulosis of intestine, part unspecified, without perforation or abscess without bleeding: Secondary | ICD-10-CM | POA: Diagnosis not present

## 2016-03-13 DIAGNOSIS — R569 Unspecified convulsions: Secondary | ICD-10-CM | POA: Diagnosis not present

## 2016-03-21 DIAGNOSIS — K5790 Diverticulosis of intestine, part unspecified, without perforation or abscess without bleeding: Secondary | ICD-10-CM | POA: Diagnosis not present

## 2016-03-21 DIAGNOSIS — K589 Irritable bowel syndrome without diarrhea: Secondary | ICD-10-CM | POA: Diagnosis not present

## 2016-03-21 DIAGNOSIS — R569 Unspecified convulsions: Secondary | ICD-10-CM | POA: Diagnosis not present

## 2016-03-21 DIAGNOSIS — F0280 Dementia in other diseases classified elsewhere without behavioral disturbance: Secondary | ICD-10-CM | POA: Diagnosis not present

## 2016-03-21 DIAGNOSIS — G309 Alzheimer's disease, unspecified: Secondary | ICD-10-CM | POA: Diagnosis not present

## 2016-03-21 DIAGNOSIS — M6281 Muscle weakness (generalized): Secondary | ICD-10-CM | POA: Diagnosis not present

## 2016-03-24 ENCOUNTER — Ambulatory Visit: Payer: Medicare Other | Admitting: Neurology

## 2016-03-24 DIAGNOSIS — R569 Unspecified convulsions: Secondary | ICD-10-CM | POA: Diagnosis not present

## 2016-03-24 DIAGNOSIS — G309 Alzheimer's disease, unspecified: Secondary | ICD-10-CM | POA: Diagnosis not present

## 2016-03-24 DIAGNOSIS — F0280 Dementia in other diseases classified elsewhere without behavioral disturbance: Secondary | ICD-10-CM | POA: Diagnosis not present

## 2016-03-24 DIAGNOSIS — K589 Irritable bowel syndrome without diarrhea: Secondary | ICD-10-CM | POA: Diagnosis not present

## 2016-03-24 DIAGNOSIS — M6281 Muscle weakness (generalized): Secondary | ICD-10-CM | POA: Diagnosis not present

## 2016-03-24 DIAGNOSIS — K5790 Diverticulosis of intestine, part unspecified, without perforation or abscess without bleeding: Secondary | ICD-10-CM | POA: Diagnosis not present

## 2016-03-30 DIAGNOSIS — F0280 Dementia in other diseases classified elsewhere without behavioral disturbance: Secondary | ICD-10-CM | POA: Diagnosis not present

## 2016-03-30 DIAGNOSIS — G309 Alzheimer's disease, unspecified: Secondary | ICD-10-CM | POA: Diagnosis not present

## 2016-03-30 DIAGNOSIS — K589 Irritable bowel syndrome without diarrhea: Secondary | ICD-10-CM | POA: Diagnosis not present

## 2016-03-30 DIAGNOSIS — M6281 Muscle weakness (generalized): Secondary | ICD-10-CM | POA: Diagnosis not present

## 2016-03-30 DIAGNOSIS — K5790 Diverticulosis of intestine, part unspecified, without perforation or abscess without bleeding: Secondary | ICD-10-CM | POA: Diagnosis not present

## 2016-03-30 DIAGNOSIS — R569 Unspecified convulsions: Secondary | ICD-10-CM | POA: Diagnosis not present

## 2016-03-31 DIAGNOSIS — K5790 Diverticulosis of intestine, part unspecified, without perforation or abscess without bleeding: Secondary | ICD-10-CM | POA: Diagnosis not present

## 2016-03-31 DIAGNOSIS — G309 Alzheimer's disease, unspecified: Secondary | ICD-10-CM | POA: Diagnosis not present

## 2016-03-31 DIAGNOSIS — K589 Irritable bowel syndrome without diarrhea: Secondary | ICD-10-CM | POA: Diagnosis not present

## 2016-03-31 DIAGNOSIS — M6281 Muscle weakness (generalized): Secondary | ICD-10-CM | POA: Diagnosis not present

## 2016-03-31 DIAGNOSIS — R569 Unspecified convulsions: Secondary | ICD-10-CM | POA: Diagnosis not present

## 2016-03-31 DIAGNOSIS — F0280 Dementia in other diseases classified elsewhere without behavioral disturbance: Secondary | ICD-10-CM | POA: Diagnosis not present

## 2016-04-07 DIAGNOSIS — E785 Hyperlipidemia, unspecified: Secondary | ICD-10-CM | POA: Diagnosis not present

## 2016-04-07 DIAGNOSIS — R7989 Other specified abnormal findings of blood chemistry: Secondary | ICD-10-CM | POA: Diagnosis not present

## 2016-04-07 DIAGNOSIS — R12 Heartburn: Secondary | ICD-10-CM | POA: Diagnosis not present

## 2016-04-07 DIAGNOSIS — R918 Other nonspecific abnormal finding of lung field: Secondary | ICD-10-CM | POA: Diagnosis not present

## 2016-04-07 DIAGNOSIS — H5441 Blindness, right eye, normal vision left eye: Secondary | ICD-10-CM | POA: Diagnosis not present

## 2016-04-07 DIAGNOSIS — N289 Disorder of kidney and ureter, unspecified: Secondary | ICD-10-CM | POA: Diagnosis not present

## 2016-04-07 DIAGNOSIS — G40909 Epilepsy, unspecified, not intractable, without status epilepticus: Secondary | ICD-10-CM | POA: Diagnosis not present

## 2016-04-07 DIAGNOSIS — I10 Essential (primary) hypertension: Secondary | ICD-10-CM | POA: Diagnosis not present

## 2016-04-07 DIAGNOSIS — F0390 Unspecified dementia without behavioral disturbance: Secondary | ICD-10-CM | POA: Diagnosis not present

## 2016-04-07 DIAGNOSIS — R4182 Altered mental status, unspecified: Secondary | ICD-10-CM | POA: Diagnosis not present

## 2016-04-07 DIAGNOSIS — K219 Gastro-esophageal reflux disease without esophagitis: Secondary | ICD-10-CM | POA: Diagnosis not present

## 2016-04-13 ENCOUNTER — Ambulatory Visit (INDEPENDENT_AMBULATORY_CARE_PROVIDER_SITE_OTHER): Payer: Medicare Other | Admitting: Internal Medicine

## 2016-04-13 ENCOUNTER — Encounter: Payer: Self-pay | Admitting: Internal Medicine

## 2016-04-13 VITALS — BP 112/64 | HR 60 | Temp 98.0°F | Resp 14 | Ht 65.0 in | Wt 157.0 lb

## 2016-04-13 DIAGNOSIS — R569 Unspecified convulsions: Secondary | ICD-10-CM | POA: Diagnosis not present

## 2016-04-13 NOTE — Progress Notes (Signed)
Assessment and Plan: [phone] (if 7 days high complexity)  hospital visit follow up for Seizures:  -stop exelon patch -check keppra level  -cont 500 mg BID -avoid flashing lights and hyperventilation -patient has appointment set up with Dr. Delice Lesch on Oct 31st 2017  Over 40 minutes of exam, counseling, chart review, and complex, high/moderate level critical decision making was performed this visit.   HPI 60 y.o.female presents for follow up from the hospital. Admit date to the hospital was 03/02/16, patient was discharged from the hospital on 03/05/16 and our office contacted the office the day after discharge to set up a follow up appointment, patient was admitted for: a seizure.  Her husband reports that [place].  He reports that she was sitting comfortably and then her head fell on her husbands shoulder and she was not responsive.  He reports that they did admit her because she didn't return to her normal baseline. He reports that they ran CT scan of her head and also did a full blood workup.  He reports that they stopped the exelon patches.  He did start her on Keppra.  He reports that since stopping the patch her mood has stabilized a little bit more.  He reports that she is very dependent on her husband and he cannot leave her for more than 10-15 minutes.  He reports that the left sided rigidity is improving some.    Images while in the hospital: Dg Thoracic Spine W/swimmers  Result Date: 03/08/2016 CLINICAL DATA:  Upper and lower back pain since seizure 5 days ago EXAM: THORACIC SPINE - 3 VIEWS COMPARISON:  None. FINDINGS: Slight rightward curvature in the mid thoracic spine. No fracture. No subluxation. No significant bone abnormality. IMPRESSION: No acute findings. Electronically Signed   By: Rolm Baptise M.D.   On: 03/08/2016 08:14   Dg Lumbar Spine Complete  Result Date: 03/08/2016 CLINICAL DATA:  Back pain since seizure 5 days ago  EXAM: LUMBAR SPINE - COMPLETE 4+ VIEW COMPARISON:  CT 07/06/2014 FINDINGS: Degenerative facet disease in the mid and lower lumbar spine. Normal alignment. Early degenerative spurring anteriorly. No fracture or subluxation. IMPRESSION: No acute bony abnormality. Electronically Signed   By: Rolm Baptise M.D.   On: 03/08/2016 08:15    Past Medical History:  Diagnosis Date  . Anemia    Iron def   . Bacterial overgrowth syndrome   . Chronic headaches   . Dementia   . Depression   . Diverticulosis of colon (without mention of hemorrhage)   . Family history of malignant neoplasm of gastrointestinal tract   . GERD (gastroesophageal reflux disease)   . Headache(784.0)   . Hypercholesterolemia   . Hypertension   . IBS (irritable bowel syndrome)   . Osteoarthritis   . Pancreatitis   . Unspecified gastritis and gastroduodenitis without mention of hemorrhage   . Vitamin B12 deficiency      Allergies  Allergen Reactions  . Amitriptyline Other (See Comments)    dysphoria  . Carafate [Sucralfate] Other (See Comments)    constipation  . Amitriptyline Other (See Comments)    dysphoria  . Carafate [Sucralfate] Other (See Comments)    constipation  . Codeine Other (See Comments)    Insomnia; agitation; restlessness   . Lexapro [Escitalopram Oxalate] Other (See Comments)    Insomnia; agitation; restlessness   . Paxil [Paroxetine Hcl] Other (See Comments)    Insomnia; agitation; restlessness   .  Prednisone Other (See Comments)    Unknown reaction  . Promethazine Nausea Only  . Zoloft [Sertraline Hcl] Other (See Comments)    Insomnia; agitation; restlessness   . Codeine Other (See Comments)    Insomnia; agitation; restlessness   . Lexapro [Escitalopram Oxalate] Other (See Comments)    Insomnia; agitation; restlessness   . Paxil [Paroxetine Hcl] Other (See Comments)    Insomnia; agitation; restlessness   . Prednisone Other (See Comments)    unknown  . Promethazine Hcl Nausea Only  .  Zoloft [Sertraline Hcl] Other (See Comments)    Insomnia; agitation; restlessness       Current Outpatient Prescriptions on File Prior to Visit  Medication Sig Dispense Refill  . acetaminophen (TYLENOL) 325 MG tablet Take 650 mg by mouth every 6 (six) hours as needed for mild pain, fever or headache.    Marland Kitchen aspirin 81 MG tablet Take 81 mg by mouth at bedtime.     Marland Kitchen atenolol (TENORMIN) 100 MG tablet TAKE 1 TABLET BY MOUTH EVERY DAY FOR BLOOD PRESSURE 90 tablet 0  . atorvastatin (LIPITOR) 40 MG tablet Take 1 tablet (40 mg total) by mouth daily. 30 tablet 5  . Cyanocobalamin (VITAMIN B-12 PO) Take 1 tablet by mouth once a week.    . Omega-3 Fatty Acids (THE VERY FINEST FISH OIL) LIQD Take 30 mLs by mouth at bedtime. Reported on 07/22/2015    . ondansetron (ZOFRAN) 8 MG tablet Take 1 tablet (8 mg total) by mouth every 8 (eight) hours as needed for nausea or vomiting. 20 tablet 1  . polyethylene glycol (MIRALAX / GLYCOLAX) packet Take 17 g by mouth daily. 30 each 2  . ranitidine (ZANTAC) 300 MG tablet Take 1 tablet (300 mg total) by mouth at bedtime. 60 tablet 1  . Vitamin D, Ergocalciferol, (DRISDOL) 50000 units CAPS capsule TAKE 1 CAPSULE DAILY OR AS DIRECTED FOR SEVERE VIT D DEFICIENCY 90 capsule 1   No current facility-administered medications on file prior to visit.     ROS: all negative except above.   Physical Exam: Filed Weights   04/13/16 1120  Weight: 157 lb (71.2 kg)   BP 112/64   Pulse 60   Temp 98 F (36.7 C) (Temporal)   Resp 14   Ht 5\' 5"  (1.651 m)   Wt 157 lb (71.2 kg)   BMI 26.13 kg/m  General Appearance: Well nourished, in no apparent distress. Eyes: Right eye with clouding of the cornea.  Left eye with normal cornea.  Sinuses: No Frontal/maxillary tenderness ENT/Mouth: Ext aud canals clear, TMs without erythema, bulging. No erythema, swelling, or exudate on post pharynx.  Tonsils not swollen or erythematous. Hearing normal.  Neck: Supple, thyroid normal.   Respiratory: Respiratory effort normal, BS equal bilaterally without rales, rhonchi, wheezing or stridor.  Cardio: RRR with no MRGs. Brisk peripheral pulses without edema.  Abdomen: Soft, + BS.  Non tender, no guarding, rebound, hernias, masses. Lymphatics: Non tender without lymphadenopathy.  Musculoskeletal: Left shoulder with minimal lateral passive abduction Skin: Warm, dry without rashes, lesions, ecchymosis.  Neuro: Cranial nerves intact. Normal muscle tone, no cerebellar symptoms. Sensation intact.  Psych: Awake and compliant with exam, minimal speech and responses to direct questions, Flat affect, Not able to determine insight or judgement.      Starlyn Skeans, PA-C 11:38 AM [facility]

## 2016-04-18 LAB — LEVETIRACETAM LEVEL: Keppra (Levetiracetam): 25.3 ug/mL

## 2016-04-27 DIAGNOSIS — H3322 Serous retinal detachment, left eye: Secondary | ICD-10-CM | POA: Diagnosis not present

## 2016-04-27 DIAGNOSIS — H43822 Vitreomacular adhesion, left eye: Secondary | ICD-10-CM | POA: Diagnosis not present

## 2016-05-03 ENCOUNTER — Encounter: Payer: Self-pay | Admitting: Physician Assistant

## 2016-05-03 ENCOUNTER — Ambulatory Visit (INDEPENDENT_AMBULATORY_CARE_PROVIDER_SITE_OTHER): Payer: Medicare Other | Admitting: Physician Assistant

## 2016-05-03 VITALS — BP 122/74 | HR 74 | Temp 97.5°F | Resp 16 | Ht 65.0 in | Wt 154.6 lb

## 2016-05-03 DIAGNOSIS — R7303 Prediabetes: Secondary | ICD-10-CM

## 2016-05-03 DIAGNOSIS — I10 Essential (primary) hypertension: Secondary | ICD-10-CM

## 2016-05-03 DIAGNOSIS — F0280 Dementia in other diseases classified elsewhere without behavioral disturbance: Secondary | ICD-10-CM

## 2016-05-03 DIAGNOSIS — F482 Pseudobulbar affect: Secondary | ICD-10-CM

## 2016-05-03 DIAGNOSIS — R569 Unspecified convulsions: Secondary | ICD-10-CM | POA: Diagnosis not present

## 2016-05-03 DIAGNOSIS — E538 Deficiency of other specified B group vitamins: Secondary | ICD-10-CM | POA: Diagnosis not present

## 2016-05-03 DIAGNOSIS — G301 Alzheimer's disease with late onset: Secondary | ICD-10-CM | POA: Diagnosis not present

## 2016-05-03 DIAGNOSIS — E785 Hyperlipidemia, unspecified: Secondary | ICD-10-CM | POA: Diagnosis not present

## 2016-05-03 DIAGNOSIS — Z79899 Other long term (current) drug therapy: Secondary | ICD-10-CM | POA: Diagnosis not present

## 2016-05-03 LAB — LIPID PANEL
Cholesterol: 165 mg/dL (ref 125–200)
HDL: 55 mg/dL (ref >=46)
LDL CALC: 77 mg/dL (ref <130)
Total CHOL/HDL Ratio: 3 Ratio (ref <=5.0)
Triglycerides: 166 mg/dL — ABNORMAL HIGH (ref <150)
VLDL: 33 mg/dL — AB (ref <30)

## 2016-05-03 LAB — CBC WITH DIFFERENTIAL/PLATELET
Basophils Absolute: 78 cells/uL (ref 0–200)
Basophils Relative: 1 %
EOS ABS: 234 {cells}/uL (ref 15–500)
Eosinophils Relative: 3 %
HEMATOCRIT: 39.4 % (ref 35.0–45.0)
HEMOGLOBIN: 12.9 g/dL (ref 11.7–15.5)
LYMPHS ABS: 1794 {cells}/uL (ref 850–3900)
LYMPHS PCT: 23 %
MCH: 28.9 pg (ref 27.0–33.0)
MCHC: 32.7 g/dL (ref 32.0–36.0)
MCV: 88.1 fL (ref 80.0–100.0)
MONO ABS: 546 {cells}/uL (ref 200–950)
MPV: 11 fL (ref 7.5–12.5)
Monocytes Relative: 7 %
NEUTROS PCT: 66 %
Neutro Abs: 5148 cells/uL (ref 1500–7800)
Platelets: 293 10*3/uL (ref 140–400)
RBC: 4.47 MIL/uL (ref 3.80–5.10)
RDW: 14 % (ref 11.0–15.0)
WBC: 7.8 10*3/uL (ref 3.8–10.8)

## 2016-05-03 LAB — HEPATIC FUNCTION PANEL
ALBUMIN: 4 g/dL (ref 3.6–5.1)
ALK PHOS: 103 U/L (ref 33–130)
ALT: 10 U/L (ref 6–29)
AST: 15 U/L (ref 10–35)
BILIRUBIN INDIRECT: 0.4 mg/dL (ref 0.2–1.2)
Bilirubin, Direct: 0.1 mg/dL (ref <=0.2)
TOTAL PROTEIN: 6.6 g/dL (ref 6.1–8.1)
Total Bilirubin: 0.5 mg/dL (ref 0.2–1.2)

## 2016-05-03 LAB — BASIC METABOLIC PANEL WITH GFR
BUN: 16 mg/dL (ref 7–25)
CALCIUM: 9.3 mg/dL (ref 8.6–10.4)
CO2: 26 mmol/L (ref 20–31)
Chloride: 106 mmol/L (ref 98–110)
Creat: 1.18 mg/dL — ABNORMAL HIGH (ref 0.50–0.99)
GFR, EST AFRICAN AMERICAN: 58 mL/min — AB (ref >=60)
GFR, EST NON AFRICAN AMERICAN: 50 mL/min — AB (ref >=60)
GLUCOSE: 98 mg/dL (ref 65–99)
POTASSIUM: 3.9 mmol/L (ref 3.5–5.3)
Sodium: 143 mmol/L (ref 135–146)

## 2016-05-03 LAB — TSH: TSH: 0.75 mIU/L

## 2016-05-03 LAB — MAGNESIUM: MAGNESIUM: 2.1 mg/dL (ref 1.5–2.5)

## 2016-05-03 LAB — CK: Total CK: 92 U/L (ref 7–177)

## 2016-05-03 MED ORDER — POLYETHYLENE GLYCOL 3350 17 G PO PACK: 17.0000 g | PACK | Freq: Every day | ORAL | 2 refills | Status: AC
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
## 2016-05-04 LAB — HEMOGLOBIN A1C
HEMOGLOBIN A1C: 5.3 % (ref <5.7)
MEAN PLASMA GLUCOSE: 105 mg/dL

## 2016-05-06 LAB — LEVETIRACETAM LEVEL: KEPPRA (LEVETIRACETAM): 21.6 ug/mL

## 2016-05-09 ENCOUNTER — Telehealth: Payer: Self-pay

## 2016-05-09 NOTE — Telephone Encounter (Signed)
KEPPRA [id number] CALLED INTO [facility].

## 2016-05-17 ENCOUNTER — Encounter: Payer: Self-pay | Admitting: Internal Medicine

## 2016-05-23 ENCOUNTER — Encounter: Payer: Self-pay | Admitting: Neurology

## 2016-05-23 ENCOUNTER — Ambulatory Visit (INDEPENDENT_AMBULATORY_CARE_PROVIDER_SITE_OTHER): Payer: Medicare Other | Admitting: Neurology

## 2016-05-23 ENCOUNTER — Telehealth: Payer: Self-pay | Admitting: Physician Assistant

## 2016-05-23 ENCOUNTER — Telehealth: Payer: Self-pay

## 2016-05-23 VITALS — BP 116/72 | HR 67 | Ht 65.0 in | Wt 152.2 lb

## 2016-05-23 DIAGNOSIS — G40309 Generalized idiopathic epilepsy and epileptic syndromes, not intractable, without status epilepticus: Secondary | ICD-10-CM

## 2016-05-23 DIAGNOSIS — G3185 Corticobasal degeneration: Secondary | ICD-10-CM

## 2016-05-23 MED ORDER — VALPROATE SODIUM 250 MG/5ML PO SOLN: ORAL | 6 refills | Status: DC

## 2016-05-23 NOTE — Progress Notes (Signed)
NEUROLOGY CONSULTATION NOTE  Helen Cabrera MRN: [id number] DOB: 1956/03/06  Referring provider: Dr. [name] Primary care provider: Dr. [name]  Reason for consult:  Seizures, dementia  Dear Dr Melford Aase:  Thank you for your kind referral of Helen Cabrera for consultation of the above symptoms. Although her history is well known to you, please allow me to reiterate it for the purpose of our medical record. The patient was accompanied to the clinic by her husband who also provides the history due to her dementia. Records and images were personally reviewed where available.  HISTORY OF PRESENT ILLNESS: This is a pleasant 60 year old right-handed woman with a history of early-onset dementia, hypertension, hyperlipidemia, presenting for new onset seizures. Her husband reports 2 seizures, one in July, and another in August, but on review of records, it appears the first event which brought them to [facility] was on 03/02/16. [place] were at a courthouse when he saw her fall to the ground with whole body jerking, eyes rolled back, gasping breathing for 10-15 minutes. She was brought to [facility], vital signs stable, she was then transferred to [facility] where she had an MRI brain which I personally reviewed, no acute changes seen. There was mild diffuse atrophy with ventriculomegaly. Routine EEG showed mild posterior background slowing. She was discharged home with no seizure medication since this was the first event. She had another episode which her husband reports she was brought to [facility] for, but no records seen. PCP note in September indicated her husband reported that they were [place] the [place] and sitting in the backseat when she suddenly gasped and fell on his shoulder, unresponsive with a little jerking, less intense than the first event. She was brought to [facility] where her husband reports Rivastigmine patch was discontinued and Keppra was  started. She has been taking Keppra 562m BID for the past month or so with no further similar episodes, however her husband is concerned that she is getting more aggravated with her family. Prior to starting Keppra, she had some behavioral issues with paranoia, but her husband reports she is now getting physical with her family, even with her husband. She tells him she is going to leave or tells people to get out of the house. She has a hard time staying still, always wanting to walk around, then getting aggravated when redirected. Her sense of direction is now "way off." He ran out of Keppra 10 days ago and called his PCP, but tells me that she has been taking ?Cephalexin?. He did not bring the bottle and will confirm with [place]   No prior history of seizures. As far as her husband knows, she had a normal birth and early development.  There is no history of febrile convulsions, CNS infections such as meningitis/encephalitis, significant traumatic brain injury, neurosurgical procedures, or family history of seizures. There is a strong family history of Alzheimer's disease in her mother (diagnosed before age 45068, maternal aunt and uncle. She had been working for [employer] years but started making mistakes around age [age] She initially saw neurologist Dr. YKrista Blue records unavailable for review, [facility] At that time, notes indicate she had a head CT showing a 1cm right tentorial meningioma confirmed by MRI. She had normal B12, ESR, RPR, ANA, TSH, Sjogren's antibodies, and SPEP. She had neuropsychological evaluation which suggested that depression and stress were likely playing a role. It  was felt that symptoms likely represent early Alzheimer's dementia worsened by underlying psychiatric issues. MMSE in 01/2013 was 24/30. She was started on Aricept and Namenda, which caused side effects. She has been on the Exelon patch for at least 2 years. She is dependent on her husband for  bathing, dressing. She can feed herself but needs supervision. They have started using Depends because she was wetting the bed at night, but is able to go to the bathroom during the daytime. She has hallucinations of people in the room only at night. She wakes up around 3am, agitated for an hour then goes back to sleep. Her husband has noticed that when she wakes up she cannot communicate as well, stammering and unable to finish her sentences, then once more awake, she is not as bad. She is noted to have myoclonic jerking of the left > right UE, left leg, which her husband reports started while she was still working at age 15, she would drop a drink from her hand. This has progressively worsened where sometimes she has a hand tremor.   PAST MEDICAL HISTORY: Past Medical History:  Diagnosis Date  . Anemia    Iron def   . Bacterial overgrowth syndrome   . Chronic headaches   . Dementia   . Depression   . Diverticulosis of colon (without mention of hemorrhage)   . Family history of malignant neoplasm of gastrointestinal tract   . GERD (gastroesophageal reflux disease)   . Headache(784.0)   . Hypercholesterolemia   . Hypertension   . IBS (irritable bowel syndrome)   . Osteoarthritis   . Pancreatitis   . Unspecified gastritis and gastroduodenitis without mention of hemorrhage   . Vitamin B12 deficiency     PAST SURGICAL HISTORY: Past Surgical History:  Procedure Laterality Date  . ABDOMINAL HYSTERECTOMY    . APPENDECTOMY    . CESAREAN SECTION     x 4  . CESAREAN SECTION     x4  . PARS PLANA VITRECTOMY Right 04/02/2014   Procedure: RIGHT PARS PLANA VITRECTOMY WITH 25 GAUGE/ENDO LASER/LENSECTOMY/INSERTION OF SILICONE OIL,REPAIR COMPLEX RETINAL DETACHMENT,MEMBRANE PEEL, IRIDECTOMY;  Surgeon: Hurman Horn, MD;  Location: [facility];  Service: Ophthalmology;  Laterality: Right;    MEDICATIONS: Current Outpatient Prescriptions on File Prior to Visit  Medication Sig Dispense Refill  .  acetaminophen (TYLENOL) 325 MG tablet Take 650 mg by mouth every 6 (six) hours as needed for mild pain, fever or headache.    Marland Kitchen aspirin 81 MG tablet Take 81 mg by mouth at bedtime.     Marland Kitchen atenolol (TENORMIN) 100 MG tablet TAKE 1 TABLET BY MOUTH EVERY DAY FOR BLOOD PRESSURE 90 tablet 0  . atorvastatin (LIPITOR) 40 MG tablet Take 1 tablet (40 mg total) by mouth daily. 30 tablet 5  . Cyanocobalamin (VITAMIN B-12 PO) Take 1 tablet by mouth once a week.    . levETIRAcetam (KEPPRA) 500 MG tablet Take 500 mg by mouth 2 (two) times daily.    . Omega-3 Fatty Acids (THE VERY FINEST FISH OIL) LIQD Take 30 mLs by mouth at bedtime. Reported on 07/22/2015    . ondansetron (ZOFRAN) 8 MG tablet Take 1 tablet (8 mg total) by mouth every 8 (eight) hours as needed for nausea or vomiting. 20 tablet 1  . polyethylene glycol (MIRALAX / GLYCOLAX) packet Take 17 g by mouth daily. 30 each 2  . ranitidine (ZANTAC) 300 MG tablet Take 1 tablet (300 mg total) by mouth at bedtime. [place]  tablet 1  . Vitamin D, Ergocalciferol, (DRISDOL) 50000 units CAPS capsule TAKE 1 CAPSULE DAILY OR AS DIRECTED FOR SEVERE VIT D DEFICIENCY 90 capsule 1   No current facility-administered medications on file prior to visit.     ALLERGIES: Allergies  Allergen Reactions  . Amitriptyline Other (See Comments)    dysphoria  . Carafate [Sucralfate] Other (See Comments)    constipation  . Amitriptyline Other (See Comments)    dysphoria  . Carafate [Sucralfate] Other (See Comments)    constipation  . Codeine Other (See Comments)    Insomnia; agitation; restlessness   . Lexapro [Escitalopram Oxalate] Other (See Comments)    Insomnia; agitation; restlessness   . Paxil [Paroxetine Hcl] Other (See Comments)    Insomnia; agitation; restlessness   . Prednisone Other (See Comments)    Unknown reaction  . Promethazine Nausea Only  . Zoloft [Sertraline Hcl] Other (See Comments)    Insomnia; agitation; restlessness   . Codeine Other (See Comments)     Insomnia; agitation; restlessness   . Lexapro [Escitalopram Oxalate] Other (See Comments)    Insomnia; agitation; restlessness   . Paxil [Paroxetine Hcl] Other (See Comments)    Insomnia; agitation; restlessness   . Prednisone Other (See Comments)    unknown  . Promethazine Hcl Nausea Only  . Zoloft [Sertraline Hcl] Other (See Comments)    Insomnia; agitation; restlessness     FAMILY HISTORY: Family History  Problem Relation Age of Onset  . Diabetes Mother   . Alcohol abuse Father   . Cirrhosis Father   . Breast cancer Maternal Aunt   . Colon cancer Maternal Aunt   . Stomach cancer Brother   . Esophageal cancer Brother   . Diabetes Brother   . Diabetes Maternal Uncle   . Heart disease      grandfather/grandmother    SOCIAL HISTORY: Social History   Social History  . Marital status: Married    Spouse name: N/A  . Number of children: 4  . Years of education: N/A   Occupational History  . CUSTOMER SERVICE [employer]   Social History Main Topics  . Smoking status: Never Smoker  . Smokeless tobacco: Not on file  . Alcohol use 0.0 oz/week     Comment: occ- very rarely  . Drug use: No  . Sexual activity: Not on file   Other Topics Concern  . Not on file   Social History Narrative   ** Merged History Encounter **        REVIEW OF SYSTEMS unable to obtain due to cognitive changes from dementia.  PHYSICAL EXAM: Vitals:   05/23/16 1256  BP: 116/72  Pulse: 67   General: No acute distress, tearful at times. Throughout the visit, she is noted to have myoclonic jerks of the left arm and leg, some in the right hand Head:  Normocephalic/atraumatic Eyes: Right eye opaque, Fundoscopic exam shows sharp disc on left, no vessel changes, exudates, or hemorrhages Neck: supple, no paraspinal tenderness, full range of motion Back: No paraspinal tenderness Heart: regular rate and rhythm Lungs: Clear to auscultation bilaterally. Vascular: No carotid  bruits. Skin/Extremities: No rash, no edema Neurological Exam: Mental status: awake and alert, can say a few phrases and follow simple commands, but cannot answer orientation questions, unable to name or repeat. Attention appears intact. No dysarthria.  Cranial nerves: CN I: not tested CN II: right eye opaque, no light perception. Left pupils round and reactive to light, visual fields intact but appears to  have some left-sided neglect, fundi unremarkable on left CN III, IV, VI:  full range of motion, no nystagmus, no ptosis CN V: facial sensation intact CN VII: upper and lower face symmetric CN VIII: hearing intact to finger rub CN IX, X: gag intact, uvula midline CN XI: sternocleidomastoid and trapezius muscles intact CN XII: tongue midline Bulk & Tone: increased with +cogwheeling bilaterally, no fasciculations. Motor: 5/5 on right, 4/5 on left with left arm kept at flexion Sensation: unable to test due to mental status Deep Tendon Reflexes: +2 throughout, no ankle clonus Plantar responses: downgoing bilaterally Cerebellar: no incoordination on finger to nose testing Gait: slow and cautious, hunched, reduced arm swing bilaterally, keeping left arm flexed Tremor: none noted today  IMPRESSION: This is a 60 year old right-handed woman with a history of early-onset dementia, hypertension, hyperlipidemia, presenting for new onset seizures. In the early stages of her dementia, she was diagnosed with early onset Alzheimer's disease, however presentation today with asymmetric exam, left hemineglect, myoclonus, cogwheeling, and report of tremor, suggestive of corticobasal degeneration. Seizures are not typically seen with this condition, however patients with dementia do have a higher frequency of seizures. The two episodes are suggestive of generalized seizures, her routine EEG reported diffuse slowing, MRI brain shows diffuse atrophy. She is having more behavioral changes with the Keppra, and will  be switched to Depakote for seizures and behavioral changes with dementia as well. She has difficulties with pills and will start Depakene 217m/5mL, take 1235mBID x 1 week, then increase to 25066mID. Side effects were discussed, we may uptitrate as tolerated/needed. She does not drive. She has 24/7 care with her family. Continue supportive care. It is unclear why rivastigmine was stopped, however her husband has not noticed any significant worsening off the medication for 2 months. Hold off for now. She will follow-up in 3 months.   Thank you for allowing me to participate in the care of this patient. Please do not hesitate to call for any questions or concerns.   KarEllouise Newer.D.  CC: Dr. McKMelford AasemaVicie MuttersA-C

## 2016-05-23 NOTE — Patient Instructions (Signed)
1. Start Depakene 250mg /76mL: Give 2.68mL twice a day for 1 week, then increase to 22mL twice a day 2. Call our office to let us know what is written on the medication bottle 3. Follow-up in 3 months, call for any changes  Seizure Precautions: 1. If medication has been prescribed for you to prevent seizures, take it exactly as directed.  Do not stop taking the medicine without talking to your doctor first, even if you have not had a seizure in a long time.   2. Avoid activities in which a seizure would cause danger to yourself or to others.  Don't operate dangerous machinery, swim alone, or climb in high or dangerous places, such as on ladders, roofs, or girders.  Do not drive unless your doctor says you may.  3. If you have any warning that you may have a seizure, lay down in a safe place where you can't hurt yourself.    4.  No driving for 6 months from last seizure, as per [facility].   Please refer to the following link on the [facility] website for more information: http://www.epilepsyfoundation.org/answerplace/Social/driving/drivingu.cfm   5.  Maintain good sleep hygiene. Avoid alcohol.  6.  Contact your doctor if you have any problems that may be related to the medicine you are taking.  7.  Call 911 and bring the patient back to the ED if:        A.  The seizure lasts longer than 5 minutes.       B.  The patient doesn't awaken shortly after the seizure  C.  The patient has new problems such as difficulty seeing, speaking or moving  D.  The patient was injured during the seizure  E.  The patient has a temperature over 102 F (39C)  F.  The patient vomited and now is having trouble breathing

## 2016-05-23 NOTE — Telephone Encounter (Signed)
Patients husband called and states patient has been off [place] for 10 days and wants to know if she should start  Depakote 5mg  or 2.5mg .   Per Dr. Delice Lesch patient should start 2.5mg  in case side effects.   Patients husband advised.

## 2016-05-23 NOTE — Telephone Encounter (Signed)
Patient and husband present with medication question. Nurse called in [place] 500mg  to [facility] however they filled Cephlexin 500mg  instead. She has since followed up with neurology, they have switched her to Depakote and I have advised her husband to stop the ABX, it was not needed. Patient and husband understand.

## 2016-05-24 ENCOUNTER — Encounter: Payer: Self-pay | Admitting: Neurology

## 2016-07-12 ENCOUNTER — Telehealth: Payer: Self-pay | Admitting: *Deleted

## 2016-07-12 NOTE — Telephone Encounter (Signed)
-----   Message from Starlyn Skeans, [place] sent at 07/12/2016  2:50 PM EST ----- Dr. Delice Lesch says that we can try cutting the current dose of depakote to 125 mg BID.  Not sure that this is coming from the medication.  You can leave the decision up to Mr. Gemberling.    Loma Sousa ----- Message ----- From: Cameron Sprang, MD Sent: 07/11/2016   3:20 PM To: Starlyn Skeans, PA-C  Thanks for update. I have not seen akathisia with Depakote as well, but I guess anything can happen. Try having him reduce dose back to 125mg  BID and see if this helps in any way.  Thanks, Santiago Glad  ----- Message ----- From: Starlyn Skeans, PA-C Sent: 07/11/2016   3:10 PM To: Cameron Sprang, MD  Dr. Delice Lesch,  Mr. Docken and his wife came into the office today and had some questions.  Since starting the liquid depakote she has been up and wandering around a lot more and cannot sit still.  He reports that she is constantly shuffling around.  Per his report it almost sounds like akathesia.  He is not sure whether this is secondary to the medication or if her dementia is worsening.  To my knowledge I have never seen akathesia with depakote.  He also reports that she is having a decreased appetite as well.  They are due to see you in February.  Anything you can recommend for [place] to try before then or would you like to see them back sooner?  Thanks for your excellent care.  You were very well liked by Mr. Fornshell.  Loma Sousa Forcucci PA-C

## 2016-07-13 ENCOUNTER — Other Ambulatory Visit: Payer: Self-pay | Admitting: Internal Medicine

## 2016-07-27 ENCOUNTER — Emergency Department (HOSPITAL_COMMUNITY): Payer: Medicare Other

## 2016-07-27 ENCOUNTER — Emergency Department (HOSPITAL_COMMUNITY)
Admission: EM | Admit: 2016-07-27 | Discharge: 2016-07-28 | Disposition: A | Discharge status: Home / Self Care | Payer: Medicare Other | Attending: Emergency Medicine | Admitting: Emergency Medicine

## 2016-07-27 ENCOUNTER — Encounter (HOSPITAL_COMMUNITY): Payer: Self-pay | Admitting: Emergency Medicine

## 2016-07-27 DIAGNOSIS — Y939 Activity, unspecified: Secondary | ICD-10-CM | POA: Diagnosis not present

## 2016-07-27 DIAGNOSIS — F0390 Unspecified dementia without behavioral disturbance: Secondary | ICD-10-CM | POA: Diagnosis not present

## 2016-07-27 DIAGNOSIS — W01198A Fall on same level from slipping, tripping and stumbling with subsequent striking against other object, initial encounter: Secondary | ICD-10-CM | POA: Diagnosis not present

## 2016-07-27 DIAGNOSIS — Y999 Unspecified external cause status: Secondary | ICD-10-CM | POA: Diagnosis not present

## 2016-07-27 DIAGNOSIS — S0101XA Laceration without foreign body of scalp, initial encounter: Secondary | ICD-10-CM | POA: Diagnosis not present

## 2016-07-27 DIAGNOSIS — S199XXA Unspecified injury of neck, initial encounter: Secondary | ICD-10-CM | POA: Diagnosis not present

## 2016-07-27 DIAGNOSIS — Z79899 Other long term (current) drug therapy: Secondary | ICD-10-CM | POA: Diagnosis not present

## 2016-07-27 DIAGNOSIS — W010XXA Fall on same level from slipping, tripping and stumbling without subsequent striking against object, initial encounter: Secondary | ICD-10-CM

## 2016-07-27 DIAGNOSIS — Y929 Unspecified place or not applicable: Secondary | ICD-10-CM | POA: Insufficient documentation

## 2016-07-27 DIAGNOSIS — Z7982 Long term (current) use of aspirin: Secondary | ICD-10-CM | POA: Diagnosis not present

## 2016-07-27 DIAGNOSIS — I10 Essential (primary) hypertension: Secondary | ICD-10-CM | POA: Diagnosis not present

## 2016-07-27 DIAGNOSIS — S0990XA Unspecified injury of head, initial encounter: Secondary | ICD-10-CM | POA: Diagnosis present

## 2016-07-27 DIAGNOSIS — R296 Repeated falls: Secondary | ICD-10-CM

## 2016-07-27 DIAGNOSIS — S0003XA Contusion of scalp, initial encounter: Secondary | ICD-10-CM | POA: Diagnosis not present

## 2016-07-27 DIAGNOSIS — S0083XA Contusion of other part of head, initial encounter: Secondary | ICD-10-CM | POA: Diagnosis not present

## 2016-07-27 DIAGNOSIS — F0391 Unspecified dementia with behavioral disturbance: Secondary | ICD-10-CM

## 2016-07-27 NOTE — ED Triage Notes (Addendum)
Per family, pt fell out of bed Monday night, there was no bruising on face on Monday, Tuesday morning, above her left eye it was bruised, bruising got worse, pt has extensive bruising on her face. Pt fell again tonight and fell on her back and has a lac to the back of her head. Pt has dementia. Falls were unwitnessed. Pt not on blood thinners. Pt appears weak, husband answering questions.

## 2016-07-28 NOTE — Discharge Instructions (Signed)

## 2016-07-28 NOTE — ED Notes (Signed)
Pt departed in NAD.  

## 2016-07-28 NOTE — ED Provider Notes (Signed)
[facility] DEPT Provider Note   CSN: [id number] Arrival date & time: 07/27/16  1954     History   Chief Complaint Chief Complaint  Patient presents with  . Fall  . Failure To Thrive    HPI Helen Cabrera is a 61 y.o. female with a hx of Anemia, dementia, frequent falls presents to the Emergency Department with family members after several falls this week.  Level V caveat for dementia. History given by patient's spouse and daughter.  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 He reports that she is often attempting to get up and walk on her own. On Monday patient fell attempting to get out of bed and struck her forehead. He reports that she's had progressive ecchymosis over the frontal portion of her face and now down around her eyes. He reports that tonight she tripped over the edge of a recliner and fell backwards striking her head on a piece of exercise equipment. No loss of consciousness. He reports that she was immediately ambulatory without difficulty. Patient has associated laceration to the occiput of her head.    Nursing reports failure to thrive however spouse reports no acute increase in altered mental status frequent falls. He states no cough, no fevers no darkening of her urine or malodor of her urine. He reports that mental status today is baseline for patient. He reports she is often agitated and this is normal.  She requires assistance with all of her ADLs at baseline.   The history is provided by the spouse, medical records and a relative. No language interpreter was used.    Past Medical History:  Diagnosis Date  . Anemia    Iron def   . Bacterial overgrowth syndrome   . Chronic headaches   . Dementia   . Depression   . Diverticulosis of colon (without mention of hemorrhage)   . Family history of malignant neoplasm of gastrointestinal tract   . GERD (gastroesophageal reflux disease)   . Headache(784.0)   .  Hypercholesterolemia   . Hypertension   . IBS (irritable bowel syndrome)   . Osteoarthritis   . Pancreatitis   . Unspecified gastritis and gastroduodenitis without mention of hemorrhage   . Vitamin B12 deficiency     Patient Active Problem List   Diagnosis Date Noted  . Fever   . Seizure-like activity ([place]) 03/02/2016  . Left arm weakness 03/02/2016  . History of depression 03/02/2016  . Seizure ([place]) 03/02/2016  . SDAT (senile dementia of Alzheimer's type) 02/15/2014  . Vitamin D deficiency 10/09/2013  . Prediabetes 10/09/2013  . Medication management 10/09/2013  . ANEMIA-IRON DEFICIENCY 01/25/2010  . Vitamin B 12 deficiency 10/11/2009  . Hyperlipidemia 10/11/2009  . ANXIETY 10/11/2009  . Essential hypertension 10/11/2009  . GERD 10/11/2009  . DIVERTICULOSIS-COLON 10/11/2009  . OSTEOARTHRITIS 10/11/2009    Past Surgical History:  Procedure Laterality Date  . ABDOMINAL HYSTERECTOMY    . APPENDECTOMY    . CESAREAN SECTION     x 4  . CESAREAN SECTION     x4  . PARS PLANA VITRECTOMY Right 04/02/2014   Procedure: RIGHT PARS PLANA VITRECTOMY WITH 25 GAUGE/ENDO LASER/LENSECTOMY/INSERTION OF SILICONE OIL,REPAIR COMPLEX RETINAL DETACHMENT,MEMBRANE PEEL, IRIDECTOMY;  Surgeon: Hurman Horn, MD;  Location: [facility];  Service: Ophthalmology;  Laterality: Right;    OB History    No data available       Home Medications    Prior to Admission medications  Medication Sig Start Date End Date Taking? Authorizing Provider  acetaminophen (TYLENOL) 325 MG tablet Take 650 mg by mouth every 6 (six) hours as needed for mild pain, fever or headache.   Yes Historical Provider, MD  aspirin 81 MG tablet Take 81 mg by mouth at bedtime.    Yes Historical Provider, MD  atenolol (TENORMIN) 100 MG tablet TAKE 1 TABLET EVERY DAY FOR BLOOD PRESSURE Patient taking differently: TAKE 1/2 TABLET EVERY DAY FOR BLOOD PRESSURE 07/13/16  Yes [name], MD  atorvastatin (LIPITOR) 40 MG tablet Take 1  tablet (40 mg total) by mouth daily. Patient taking differently: Take 20 mg by mouth 3 (three) times a week.  03/22/15  Yes [name], MD  Cyanocobalamin (VITAMIN B-12 PO) Take 1 tablet by mouth once a week.   Yes Historical Provider, MD  ondansetron (ZOFRAN) 8 MG tablet Take 1 tablet (8 mg total) by mouth every 8 (eight) hours as needed for nausea or vomiting. 06/02/14  Yes Jennifer Couillard, PA-C  polyethylene glycol (MIRALAX / GLYCOLAX) packet Take 17 g by mouth daily. Patient taking differently: Take 17 g by mouth daily as needed for mild constipation.  05/03/16  Yes Vicie Mutters, PA-C  Valproate Sodium (DEPAKENE) 250 MG/5ML SOLN solution Take 2.5 mL twice a day for 1 week, then increase to 66mL twice a day Patient taking differently: Take 150 mg by mouth 2 (two) times daily.  05/23/16  Yes Cameron Sprang, MD  Vitamin D, Ergocalciferol, (DRISDOL) 50000 units CAPS capsule TAKE 1 CAPSULE DAILY OR AS DIRECTED FOR SEVERE VIT D DEFICIENCY 08/29/15  Yes [name], MD    Family History Family History  Problem Relation Age of Onset  . Diabetes Mother   . Alcohol abuse Father   . Cirrhosis Father   . Breast cancer Maternal Aunt   . Colon cancer Maternal Aunt   . Stomach cancer Brother   . Esophageal cancer Brother   . Diabetes Brother   . Diabetes Maternal Uncle   . Heart disease      grandfather/grandmother    Social History Social History  Substance Use Topics  . Smoking status: Never Smoker  . Smokeless tobacco: Not on file  . Alcohol use 0.0 oz/week     Comment: occ- very rarely     Allergies   Amitriptyline; Carafate [sucralfate]; Amitriptyline; Carafate [sucralfate]; Codeine; Lexapro [escitalopram oxalate]; Paxil [paroxetine hcl]; Prednisone; Promethazine; Zoloft [sertraline hcl]; Codeine; Lexapro [escitalopram oxalate]; Paxil [paroxetine hcl]; Prednisone; Promethazine hcl; and Zoloft [sertraline hcl]   Review of Systems Review of Systems  Unable to perform ROS:  Dementia  HENT: Positive for facial swelling.   Skin: Positive for wound.     Physical Exam Updated Vital Signs BP 120/98   Pulse 90   Temp 97.4 F (36.3 C) (Oral)   Resp 18   SpO2 100%   Physical Exam  Constitutional: She appears well-developed and well-nourished. No distress.  Awake, alert, agitated, restless in the bed  HENT:  Head: Normocephalic. Head is with raccoon's eyes, with contusion and with laceration.    Nose: Sinus tenderness present. No epistaxis.  Mouth/Throat: Oropharynx is clear and moist. No oropharyngeal exudate.  Eyes: Conjunctivae are normal. No scleral icterus.  Right eye with surgical lens removal Left eye pupil round and reactive to light  Neck: Normal range of motion. Neck supple. No spinous process tenderness and no muscular tenderness present. Normal range of motion present.  Cardiovascular: Normal rate, regular rhythm and intact distal pulses.  Pulses:      Radial pulses are 2+ on the right side, and 2+ on the left side.       Dorsalis pedis pulses are 2+ on the right side, and 2+ on the left side.  Pulmonary/Chest: Effort normal and breath sounds normal. No respiratory distress. She has no wheezes.  Equal chest expansion  Abdominal: Soft. Bowel sounds are normal. She exhibits no mass. There is no tenderness. There is no rebound and no guarding.  Musculoskeletal: Normal range of motion. She exhibits no edema.  Patient does not follow commands; however has full passive range of motion of bilateral shoulders, elbows and wrists. Full passive range of motion of both hips knees and ankles. No obvious ecchymosis to the chest, abdomen, back, arms, legs  Neurological: She is alert. GCS eye subscore is 3. GCS verbal subscore is 3. GCS motor subscore is 5.  Baseline patient is largely nonverbal and does not often follow commands She moves all extremities without ataxia  Skin: Skin is warm and dry. She is not diaphoretic.  Psychiatric: She has a normal mood  and affect.  Nursing note and vitals reviewed.    ED Treatments / Results   Radiology Ct Head Wo Contrast  Result Date: 07/27/2016 CLINICAL DATA:  [place] out of bed 4 nights ago. [place] out of bed tonight. Posterior head laceration. Altered mental status, history of dementia, hypercholesterolemia, headache. EXAM: CT HEAD WITHOUT CONTRAST CT MAXILLOFACIAL WITHOUT CONTRAST CT CERVICAL SPINE WITHOUT CONTRAST TECHNIQUE: Multidetector CT imaging of the head, cervical spine, and maxillofacial structures were performed using the standard protocol without intravenous contrast. Multiplanar CT image reconstructions of the cervical spine and maxillofacial structures were also generated. COMPARISON:  None. FINDINGS: CT HEAD FINDINGS- motion degraded examination, repeated 4 times with minimal image quality improvement. BRAIN: Moderate to severe ventriculomegaly, similar to prior examination and advanced for age. Mild ex vacuo dilatation of RIGHT atrium and occipital horn is unchanged. No intraparenchymal hemorrhage, mass effect nor midline shift. Patchy supratentorial white matter hypodensities within normal range for patient's age, though non-specific are most compatible with chronic small vessel ischemic disease. No acute large vascular territory infarcts. No abnormal extra-axial fluid collections. Basal cisterns are patent. 11 mm calcification RIGHT cerebellar tentorium likely represents meningioma without mass effect. VASCULAR: Moderate calcific atherosclerosis of the carotid siphons. SKULL: No skull fracture. Moderate LEFT frontal scalp hematoma and small LEFT parietal scalp hematoma without subcutaneous gas or radiopaque foreign bodies. OTHER: None. CT MAXILLOFACIAL FINDINGS- motion degraded examination. OSSEOUS: The mandible is intact, the condyles are located. No acute facial fracture. No destructive bony lesions. ORBITS: RIGHT ocular globe calcifications corresponding to prior silicon injection. SINUSES: Paranasal  sinuses are well aerated. Nasal septum is midline. Included mastoid aircells are well aerated. SOFT TISSUES: Bilateral mild premalar soft tissue swelling with fat stranding consistent with contusions. No subcutaneous gas or radiopaque foreign bodies. CT CERVICAL SPINE FINDINGS- motion degraded examination. ALIGNMENT: Straightened lordosis. Vertebral bodies in alignment. SKULL BASE AND VERTEBRAE: Cervical vertebral bodies and posterior elements are intact. Mild C3-4 disc height loss and uncovertebral hypertrophy. Mild LEFT C3-4 facet arthropathy. No destructive bony lesions. C1-2 articulation maintained. SOFT TISSUES AND SPINAL CANAL: Nonacute. Enlarged heterogeneous thyroid without dominant nodule. DISC LEVELS: No significant osseous canal stenosis or neural foraminal narrowing. UPPER CHEST: Lung apices are clear. OTHER: None. IMPRESSION: CT HEAD: No acute intracranial process on this motion degraded examination. Multiple scalp hematomas. No skull fracture. Chronic findings include moderate to severe parenchymal brain volume loss and probable  11 mm RIGHT cerebellar tentorium meningioma. CT MAXILLOFACIAL: No acute facial fracture on this motion degraded examination. Bilateral facial contusions. CT CERVICAL SPINE: No acute fracture or malalignment on this motion degraded examination. Thyromegaly. Electronically Signed   By: Elon Alas M.D.   On: 07/27/2016 22:07   Ct Cervical Spine Wo Contrast  Result Date: 07/27/2016 CLINICAL DATA:  [place] out of bed 4 nights ago. [place] out of bed tonight. Posterior head laceration. Altered mental status, history of dementia, hypercholesterolemia, headache. EXAM: CT HEAD WITHOUT CONTRAST CT MAXILLOFACIAL WITHOUT CONTRAST CT CERVICAL SPINE WITHOUT CONTRAST TECHNIQUE: Multidetector CT imaging of the head, cervical spine, and maxillofacial structures were performed using the standard protocol without intravenous contrast. Multiplanar CT image reconstructions of the cervical spine  and maxillofacial structures were also generated. COMPARISON:  None. FINDINGS: CT HEAD FINDINGS- motion degraded examination, repeated 4 times with minimal image quality improvement. BRAIN: Moderate to severe ventriculomegaly, similar to prior examination and advanced for age. Mild ex vacuo dilatation of RIGHT atrium and occipital horn is unchanged. No intraparenchymal hemorrhage, mass effect nor midline shift. Patchy supratentorial white matter hypodensities within normal range for patient's age, though non-specific are most compatible with chronic small vessel ischemic disease. No acute large vascular territory infarcts. No abnormal extra-axial fluid collections. Basal cisterns are patent. 11 mm calcification RIGHT cerebellar tentorium likely represents meningioma without mass effect. VASCULAR: Moderate calcific atherosclerosis of the carotid siphons. SKULL: No skull fracture. Moderate LEFT frontal scalp hematoma and small LEFT parietal scalp hematoma without subcutaneous gas or radiopaque foreign bodies. OTHER: None. CT MAXILLOFACIAL FINDINGS- motion degraded examination. OSSEOUS: The mandible is intact, the condyles are located. No acute facial fracture. No destructive bony lesions. ORBITS: RIGHT ocular globe calcifications corresponding to prior silicon injection. SINUSES: Paranasal sinuses are well aerated. Nasal septum is midline. Included mastoid aircells are well aerated. SOFT TISSUES: Bilateral mild premalar soft tissue swelling with fat stranding consistent with contusions. No subcutaneous gas or radiopaque foreign bodies. CT CERVICAL SPINE FINDINGS- motion degraded examination. ALIGNMENT: Straightened lordosis. Vertebral bodies in alignment. SKULL BASE AND VERTEBRAE: Cervical vertebral bodies and posterior elements are intact. Mild C3-4 disc height loss and uncovertebral hypertrophy. Mild LEFT C3-4 facet arthropathy. No destructive bony lesions. C1-2 articulation maintained. SOFT TISSUES AND SPINAL  CANAL: Nonacute. Enlarged heterogeneous thyroid without dominant nodule. DISC LEVELS: No significant osseous canal stenosis or neural foraminal narrowing. UPPER CHEST: Lung apices are clear. OTHER: None. IMPRESSION: CT HEAD: No acute intracranial process on this motion degraded examination. Multiple scalp hematomas. No skull fracture. Chronic findings include moderate to severe parenchymal brain volume loss and probable 11 mm RIGHT cerebellar tentorium meningioma. CT MAXILLOFACIAL: No acute facial fracture on this motion degraded examination. Bilateral facial contusions. CT CERVICAL SPINE: No acute fracture or malalignment on this motion degraded examination. Thyromegaly. Electronically Signed   By: Elon Alas M.D.   On: 07/27/2016 22:07   Ct Maxillofacial Wo Contrast  Result Date: 07/27/2016 CLINICAL DATA:  [place] out of bed 4 nights ago. [place] out of bed tonight. Posterior head laceration. Altered mental status, history of dementia, hypercholesterolemia, headache. EXAM: CT HEAD WITHOUT CONTRAST CT MAXILLOFACIAL WITHOUT CONTRAST CT CERVICAL SPINE WITHOUT CONTRAST TECHNIQUE: Multidetector CT imaging of the head, cervical spine, and maxillofacial structures were performed using the standard protocol without intravenous contrast. Multiplanar CT image reconstructions of the cervical spine and maxillofacial structures were also generated. COMPARISON:  None. FINDINGS: CT HEAD FINDINGS- motion degraded examination, repeated 4 times with minimal image quality improvement. BRAIN: Moderate to severe  ventriculomegaly, similar to prior examination and advanced for age. Mild ex vacuo dilatation of RIGHT atrium and occipital horn is unchanged. No intraparenchymal hemorrhage, mass effect nor midline shift. Patchy supratentorial white matter hypodensities within normal range for patient's age, though non-specific are most compatible with chronic small vessel ischemic disease. No acute large vascular territory infarcts. No  abnormal extra-axial fluid collections. Basal cisterns are patent. 11 mm calcification RIGHT cerebellar tentorium likely represents meningioma without mass effect. VASCULAR: Moderate calcific atherosclerosis of the carotid siphons. SKULL: No skull fracture. Moderate LEFT frontal scalp hematoma and small LEFT parietal scalp hematoma without subcutaneous gas or radiopaque foreign bodies. OTHER: None. CT MAXILLOFACIAL FINDINGS- motion degraded examination. OSSEOUS: The mandible is intact, the condyles are located. No acute facial fracture. No destructive bony lesions. ORBITS: RIGHT ocular globe calcifications corresponding to prior silicon injection. SINUSES: Paranasal sinuses are well aerated. Nasal septum is midline. Included mastoid aircells are well aerated. SOFT TISSUES: Bilateral mild premalar soft tissue swelling with fat stranding consistent with contusions. No subcutaneous gas or radiopaque foreign bodies. CT CERVICAL SPINE FINDINGS- motion degraded examination. ALIGNMENT: Straightened lordosis. Vertebral bodies in alignment. SKULL BASE AND VERTEBRAE: Cervical vertebral bodies and posterior elements are intact. Mild C3-4 disc height loss and uncovertebral hypertrophy. Mild LEFT C3-4 facet arthropathy. No destructive bony lesions. C1-2 articulation maintained. SOFT TISSUES AND SPINAL CANAL: Nonacute. Enlarged heterogeneous thyroid without dominant nodule. DISC LEVELS: No significant osseous canal stenosis or neural foraminal narrowing. UPPER CHEST: Lung apices are clear. OTHER: None. IMPRESSION: CT HEAD: No acute intracranial process on this motion degraded examination. Multiple scalp hematomas. No skull fracture. Chronic findings include moderate to severe parenchymal brain volume loss and probable 11 mm RIGHT cerebellar tentorium meningioma. CT MAXILLOFACIAL: No acute facial fracture on this motion degraded examination. Bilateral facial contusions. CT CERVICAL SPINE: No acute fracture or malalignment on this  motion degraded examination. Thyromegaly. Electronically Signed   By: Elon Alas M.D.   On: 07/27/2016 22:07    Procedures .Marland KitchenLaceration Repair Date/Time: 07/28/2016 12:15 PM Performed by: Abigail Butts Authorized by: Abigail Butts   Consent:    Consent obtained:  Verbal   Consent given by:  Spouse   Risks discussed:  Pain and infection   Alternatives discussed:  No treatment Laceration details:    Location:  Scalp   Scalp location:  Occipital   Length (cm):  2.5 Repair type:    Repair type:  Simple Pre-procedure details:    Preparation:  Patient was prepped and draped in usual sterile fashion Exploration:    Hemostasis achieved with:  Direct pressure   Wound exploration: entire depth of wound probed and visualized   Treatment:    Area cleansed with:  Shur-Clens   Amount of cleaning:  Standard   Irrigation solution:  Sterile water   Irrigation method:  Syringe Skin repair:    Repair method:  Staples   Number of staples:  3 Approximation:    Approximation:  Close   V[place] border: well-aligned   Post-procedure details:    Dressing:  Open (no dressing)   Patient tolerance of procedure:  Tolerated well, no immediate complications   (including critical care time)  Medications Ordered in ED Medications - No data to display   Initial Impression / Assessment and Plan / ED Course  I have reviewed the triage vital signs and the nursing notes.  Pertinent labs & imaging results that were available during my care of the patient were reviewed by me and considered in my medical  decision making (see chart for details).  Clinical Course     Pt with recurrent falls. Nursing reports failure to thrive however family reports steady decline in mental status but no acute changes. I have offered further evaluation for altered mental status including blood work, chest x-ray and urinalysis. Family has declined, wishing simply for repair of laceration. CT of head face  and neck are without acute abnormality including no intracranial hemorrhage skull fracture, cervical fracture or facial bone fracture.  Discussed with family wound care and follow-up. Also discussed reasons to return to the emergency department including acute change in mental status.  Family states understanding and is in agreement with the plan.  The patient was discussed with and seen by Dr. Christy Gentles who agrees with the treatment plan.   Final Clinical Impressions(s) / ED Diagnoses   Final diagnoses:  Dementia with behavioral disturbance, unspecified dementia type  Falls frequently  Fall on same level from slipping, tripping or stumbling, initial encounter    New Prescriptions New Prescriptions   No medications on file     Abigail Butts, PA-C 07/28/16 0030    Ripley Fraise, MD 07/28/16 [phone]

## 2016-08-10 ENCOUNTER — Ambulatory Visit: Payer: Self-pay | Admitting: Internal Medicine

## 2016-08-10 NOTE — Progress Notes (Signed)
S  N  O  W D  A  Y 

## 2016-08-28 ENCOUNTER — Encounter: Payer: Self-pay | Admitting: Neurology

## 2016-08-28 ENCOUNTER — Ambulatory Visit (INDEPENDENT_AMBULATORY_CARE_PROVIDER_SITE_OTHER): Payer: Medicare Other | Admitting: Neurology

## 2016-08-28 VITALS — BP 116/72 | HR 82 | Ht 65.0 in | Wt 130.2 lb

## 2016-08-28 DIAGNOSIS — G40309 Generalized idiopathic epilepsy and epileptic syndromes, not intractable, without status epilepticus: Secondary | ICD-10-CM

## 2016-08-28 DIAGNOSIS — G3185 Corticobasal degeneration: Secondary | ICD-10-CM | POA: Diagnosis not present

## 2016-08-28 NOTE — Patient Instructions (Signed)
1. Continue on lower dose of Depakote 2. Continue all your other medications 3. If mood worsens, call our office and we will consider another medication 4. Follow-up in 6 months  Seizure Precautions: 1. If medication has been prescribed for you to prevent seizures, take it exactly as directed.  Do not stop taking the medicine without talking to your doctor first, even if you have not had a seizure in a long time.   2. Avoid activities in which a seizure would cause danger to yourself or to others.  Don't operate dangerous machinery, swim alone, or climb in high or dangerous places, such as on ladders, roofs, or girders.  Do not drive unless your doctor says you may.  3. If you have any warning that you may have a seizure, lay down in a safe place where you can't hurt yourself.    4.  No driving for 6 months from last seizure, as per [facility].   Please refer to the following link on the [facility] website for more information: http://[email or web address]/[email or web address]/[email or web address]   5.  Maintain good sleep hygiene. Avoid alcohol.  6.  Contact your doctor if you have any problems that may be related to the medicine you are taking.  7.  Call 911 and bring the patient back to the ED if:        A.  The seizure lasts longer than 5 minutes.       B.  The patient doesn't awaken shortly after the seizure  C.  The patient has new problems such as difficulty seeing, speaking or moving  D.  The patient was injured during the seizure  E.  The patient has a temperature over 102 F (39C)  F.  The patient vomited and now is having trouble breathing

## 2016-08-28 NOTE — Progress Notes (Signed)
NEUROLOGY FOLLOW UP OFFICE NOTE  [name] [id number]  HISTORY OF PRESENT ILLNESS: I had the pleasure of seeing Helen Cabrera in follow-up in the neurology clinic on 08/28/2016.  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 She is again accompanied by her husband who helps supplement the history today.  Records and images were personally reviewed where available.  On her last visit, she was noted to have an asymmetric exam, left hemineglect, myoclonus, cogwheeling, and report of tremor. She was also reported to have more behavioral changes with Keppra. She was switched to Depakote, her husband had called the PCP office to report that on the higher dose she was up and wandering a lot more, unable to sit still. Dose was reduced to 155m BID, he states this improved a little, she can sit still a little longer, but continues to have periods where she gets up and walks constantly, sometimes walking 30 minutes, sits down, then stands back up again. She gets agitated when he tries to get her to stay still, screaming. She has had 2 falls since her last visit, bruising her forehead. With one fall last January she had a head CT which I personally reviewed, no acute changes, there was moderate to severe ventriculomegaly, mild ex vacuo dilatation of right atrium and occipital horn. There was an 11 mm calcification right cerebellar tentorium likely representing meningioma without mass effect.  HPI 13/31/2017: This is a pleasant 61yo RH woman with a history of early-onset dementia, hypertension, hyperlipidemia, who presented for new onset seizures. Her husband reports 2 seizures, one in July, and another in August, but on review of records, it appears [facility] [place] when he saw her fall to the ground with whole body jerking,  eyes rolled back, gasping breathing for 10-15 minutes. [facility] I personally reviewed, no acute changes seen. There was mild diffuse atrophy with ventriculomegaly. Routine EEG showed mild posterior background slowing. She was discharged home with no seizure medication since this was the first event. She had another episode which [facility] but [facility] seen. PCP note in September indicated her husband reported that [place] and sitting in the backseat when she suddenly gasped and fell on his shoulder, unresponsive with a little jerking, less intense than the first event. [facility] and Keppra was started. She has been taking Keppra 5053mBID for the past month or so with no further similar episodes, however her husband is concerned that she is getting more aggravated with her family. Prior to starting Keppra, she had some behavioral issues with paranoia, but her husband reports she is now getting physical with her family, even with her husband. She tells him she is going to leave or tells people to get out of the house. She has a hard time staying still, always wanting to walk around, then getting aggravated when redirected. Her sense of direction is now "way off." He ran out of Keppra 10 days ago and called his PCP, but tells me that she has been taking ?Cephalexin?. He did not bring the bottle and will  confirm with [place].   No prior history of seizures. As far as her husband knows, she had a normal birth and early development.  There is no history of febrile convulsions, CNS infections such as meningitis/encephalitis, significant traumatic brain injury, neurosurgical procedures, or family history of seizures. There is a strong family history of  Alzheimer's disease in her mother (diagnosed before age 16), maternal aunt and uncle. She had been working for [employer] for 40 years but started making mistakes around age 61 or 49. She initially saw neurologist Dr. Krista Blue, records unavailable for review, then saw Dr. Dimas Millin at [facility] in 2013. At that time, notes indicate she had a head CT showing a 1cm right tentorial meningioma confirmed by MRI. She had normal B12, ESR, RPR, ANA, TSH, Sjogren's antibodies, and SPEP. She had neuropsychological evaluation which suggested that depression and stress were likely playing a role. It was felt that symptoms likely represent early Alzheimer's dementia worsened by underlying psychiatric issues. MMSE in 01/2013 was 24/30. She was started on Aricept and Namenda, which caused side effects. She has been on the Exelon patch for at least 2 years. She is dependent on her husband for bathing, dressing. She can feed herself but needs supervision. They have started using Depends because she was wetting the bed at night, but is able to go to the bathroom during the daytime. She has hallucinations of people in the room only at night. She wakes up around 3am, agitated for an hour then goes back to sleep. Her husband has noticed that when she wakes up she cannot communicate as well, stammering and unable to finish her sentences, then once more awake, she is not as bad. She is noted to have myoclonic jerking of the left > right UE, left leg, which her husband reports started while she was still working at age 61, she would drop a drink from her hand. This has progressively worsened where sometimes she has a hand tremor.  PAST MEDICAL HISTORY: Past Medical History:  Diagnosis Date  . Anemia    Iron def   . Bacterial overgrowth syndrome   . Chronic headaches   . Dementia   . Depression   . Diverticulosis of colon (without mention of hemorrhage)   . Family history of malignant neoplasm of gastrointestinal tract   . GERD  (gastroesophageal reflux disease)   . Headache(784.0)   . Hypercholesterolemia   . Hypertension   . IBS (irritable bowel syndrome)   . Osteoarthritis   . Pancreatitis   . Unspecified gastritis and gastroduodenitis without mention of hemorrhage   . Vitamin B12 deficiency     MEDICATIONS:   Outpatient Encounter Prescriptions as of 08/28/2016  Medication Sig  . acetaminophen (TYLENOL) 325 MG tablet Take 650 mg by mouth every 6 (six) hours as needed for mild pain, fever or headache.  Marland Kitchen aspirin 81 MG tablet Take 81 mg by mouth at bedtime.   Marland Kitchen atenolol (TENORMIN) 100 MG tablet Take 100 mg by mouth.  Marland Kitchen atorvastatin (LIPITOR) 40 MG tablet Take 20 mg by mouth.  . Cyanocobalamin (VITAMIN B-12 PO) Take 1 tablet by mouth once a week.  . ondansetron (ZOFRAN) 8 MG tablet Take 1 tablet (8 mg total) by mouth every 8 (eight) hours as needed for nausea or vomiting.  . polyethylene glycol (MIRALAX / GLYCOLAX) packet Take 17 g by mouth daily. (Patient taking differently: Take 17 g by mouth daily as needed for mild constipation. )  . Valproate Sodium (DEPAKENE) 250 MG/5ML  SOLN solution Take 2.5 mL twice a day for 1 week, then increase to 82m twice a day (Patient taking differently: Take 150 mg by mouth 2 (two) times daily. )  . Vitamin D, Ergocalciferol, (DRISDOL) 50000 units CAPS capsule TAKE 1 CAPSULE DAILY OR AS DIRECTED FOR SEVERE VIT D DEFICIENCY  . rivastigmine (EXELON) 4.6 mg/24hr Place 4.6 mg onto the skin.   No facility-administered encounter medications on file as of 08/28/2016.    ALLERGIES: Allergies  Allergen Reactions  . Amitriptyline Other (See Comments)    dysphoria  . Carafate [Sucralfate] Other (See Comments)    constipation  . Amitriptyline Other (See Comments)    dysphoria  . Carafate [Sucralfate] Other (See Comments)    constipation  . Codeine Other (See Comments)    Insomnia; agitation; restlessness   . Lexapro [Escitalopram Oxalate] Other (See Comments)    Insomnia; agitation;  restlessness   . Paxil [Paroxetine Hcl] Other (See Comments)    Insomnia; agitation; restlessness   . Prednisone Other (See Comments)    Unknown reaction  . Promethazine Nausea Only  . Zoloft [Sertraline Hcl] Other (See Comments)    Insomnia; agitation; restlessness   . Codeine Other (See Comments)    Insomnia; agitation; restlessness   . Lexapro [Escitalopram Oxalate] Other (See Comments)    Insomnia; agitation; restlessness   . Paxil [Paroxetine Hcl] Other (See Comments)    Insomnia; agitation; restlessness   . Prednisone Other (See Comments)    unknown  . Promethazine Hcl Nausea Only  . Zoloft [Sertraline Hcl] Other (See Comments)    Insomnia; agitation; restlessness     FAMILY HISTORY: Family History  Problem Relation Age of Onset  . Diabetes Mother   . Alcohol abuse Father   . Cirrhosis Father   . Breast cancer Maternal Aunt   . Colon cancer Maternal Aunt   . Stomach cancer Brother   . Esophageal cancer Brother   . Diabetes Brother   . Diabetes Maternal Uncle   . Heart disease      grandfather/grandmother    SOCIAL HISTORY: Social History   Social History  . Marital status: Married    Spouse name: N/A  . Number of children: 4  . Years of education: N/A   Occupational History  . CUSTOMER SERVICE [employer]   Social History Main Topics  . Smoking status: Never Smoker  . Smokeless tobacco: Not on file  . Alcohol use 0.0 oz/week     Comment: occ- very rarely  . Drug use: No  . Sexual activity: Not on file   Other Topics Concern  . Not on file   Social History Narrative   ** Merged History Encounter **        REVIEW OF SYSTEMS unable to obtain due to mental status  PHYSICAL EXAM: Vitals:   08/28/16 1329  BP: 116/72  Pulse: 82   General: No acute distress, smiles when spoken to, smiles when instructed to but does not consistently follow commands. Throughout the visit, she is again noted to have myoclonic jerks of the left hand and left side of  face, improved from previous visit Head:  Normocephalic/atraumatic Eyes: Right eye opaque Neck: supple, no paraspinal tenderness, full range of motion Back: No paraspinal tenderness Heart: regular rate and rhythm Lungs: Clear to auscultation bilaterally. Vascular: No carotid bruits. Skin/Extremities: No rash, no edema Neurological Exam: Mental status: awake and alert, no verbal output today. Attention appears intact Cranial nerves: CN I: not tested CN II: right  eye opaque, no light perception. Left pupil round and reactive to light, visual fields intact but again appears to have some left-sided neglect CN III, IV, VI:  full range of motion, no nystagmus, no ptosis CN V: facial sensation intact CN VII: upper and lower face symmetric CN VIII: hearing intact to finger rub CN IX, X: gag intact, uvula midline CN XI: sternocleidomastoid and trapezius muscles intact CN XII: tongue midline Bulk & Tone: increased with +cogwheeling bilaterally, no fasciculations. Motor: 5/5 on right, 4/5 on left with left arm kept at flexion (similar to prior) Sensation: unable to test due to mental status Deep Tendon Reflexes: +2 throughout, no ankle clonus Plantar responses: downgoing bilaterally Cerebellar: no incoordination on finger to nose testing Gait: slow and cautious, hunched, reduced arm swing bilaterally, keeping left arm flexed (similar to prior) Tremor: none noted today  IMPRESSION: This is a 61 yo RH woman with a history of early-onset dementia, hypertension, hyperlipidemia, with new onset seizures. In the early stages of her dementia, she was diagnosed with early onset Alzheimer's disease, however presentation today with asymmetric exam, left hemineglect, myoclonus, cogwheeling, and report of tremor, suggestive of corticobasal degeneration. Seizures are not typically seen with this condition, however patients with dementia do have a higher frequency of seizures. The two episodes are suggestive  of generalized seizures, her routine EEG reported diffuse slowing, MRI brain shows diffuse atrophy. She has been switched to Depakote, no further seizures but seems to be having more restlessness and agitation. We discussed consideration for adding an SSRI but she has had side effects of increased restlessness and agitation in the past to several SSRIs. Her husband would like to stay on current medication and knows to call for any changes. Continue supportive care. I suspect restlessness and agitation is related more to progression of her neurodegenerative condition rather than medication side effect. She has 24/7 care, her husband states he is fine with no need for external help at this time. She will follow-up in 6 months.   Thank you for allowing me to participate in her care.  Please do not hesitate to call for any questions or concerns.  The duration of this appointment visit was 25 minutes of face-to-face time with the patient.  Greater than 50% of this time was spent in counseling, explanation of diagnosis, planning of further management, and coordination of care.   Ellouise Newer, M.D.   CC: Dr. Melford Aase

## 2016-09-20 ENCOUNTER — Encounter: Payer: Self-pay | Admitting: Internal Medicine

## 2016-09-20 ENCOUNTER — Ambulatory Visit (INDEPENDENT_AMBULATORY_CARE_PROVIDER_SITE_OTHER): Payer: Medicare Other | Admitting: Internal Medicine

## 2016-09-20 VITALS — BP 128/60 | HR 67 | Temp 97.6°F | Resp 14 | Ht 65.0 in | Wt 132.4 lb

## 2016-09-20 DIAGNOSIS — F0391 Unspecified dementia with behavioral disturbance: Secondary | ICD-10-CM

## 2016-09-20 DIAGNOSIS — Z79899 Other long term (current) drug therapy: Secondary | ICD-10-CM | POA: Diagnosis not present

## 2016-09-20 DIAGNOSIS — R569 Unspecified convulsions: Secondary | ICD-10-CM | POA: Diagnosis not present

## 2016-09-20 DIAGNOSIS — R634 Abnormal weight loss: Secondary | ICD-10-CM

## 2016-09-20 DIAGNOSIS — T148XXA Other injury of unspecified body region, initial encounter: Secondary | ICD-10-CM

## 2016-09-20 LAB — CBC WITH DIFFERENTIAL/PLATELET
BASOS ABS: 61 {cells}/uL (ref 0–200)
Basophils Relative: 1 %
EOS ABS: 122 {cells}/uL (ref 15–500)
Eosinophils Relative: 2 %
HCT: 34 % — ABNORMAL LOW (ref 35.0–45.0)
HEMOGLOBIN: 11 g/dL — AB (ref 11.7–15.5)
LYMPHS ABS: 1281 {cells}/uL (ref 850–3900)
Lymphocytes Relative: 21 %
MCH: 30.5 pg (ref 27.0–33.0)
MCHC: 32.4 g/dL (ref 32.0–36.0)
MCV: 94.2 fL (ref 80.0–100.0)
MONOS PCT: 8 %
MPV: 10.2 fL (ref 7.5–12.5)
Monocytes Absolute: 488 cells/uL (ref 200–950)
NEUTROS ABS: 4148 {cells}/uL (ref 1500–7800)
NEUTROS PCT: 68 %
Platelets: 264 10*3/uL (ref 140–400)
RBC: 3.61 MIL/uL — ABNORMAL LOW (ref 3.80–5.10)
RDW: 13.6 % (ref 11.0–15.0)
WBC: 6.1 10*3/uL (ref 3.8–10.8)

## 2016-09-20 LAB — TSH: TSH: 0.61 mIU/L

## 2016-09-20 NOTE — Patient Instructions (Signed)

## 2016-09-20 NOTE — Progress Notes (Signed)
[facility]   [name], M.D.    Helen Cabrera. Helen Cabrera, P.A.-C      Helen Cabrera, P.A.-C  [facility]                [address] [place], [place] [id number] Telephone [phone] Telefax [phone]  Subjective:    Patient ID: Helen Cabrera, female    DOB: 16-Feb-1956, 61 y.o.   MRN: [id number]  HPI  Patient is a very nice 61 yo MWF with rapidly progressive Dementia who is meticulously cared for by her husband who brings her in today to check a "lump" on her proximal Left shin related to a trip and fall in the ir yard 1 week ago. He denies c/o pain or noted limping. Patient missed her routine Jan OV due to office closed for snow. Patient is also followed closely by Dr Delice Lesch for her Dementia and  recent onset focal  Seizures. Patients communication skills have rapidly worsened over the last year and she seems unable to comprehend simple questions or follow simple 1-step commands.     Patient is noted to have last about 30-35 # over the last 8-9 months and husband reports her intake is good.   Medication Sig  . acetaminophen (TYLENOL) 325 MG tablet Take 650 mg by mouth every 6 (six) hours as needed for mild pain, fever or headache.  Marland Kitchen aspirin 81 MG tablet Take 81 mg by mouth at bedtime.   Marland Kitchen atenolol (TENORMIN) 100 MG tablet Take 100 mg by mouth.  Marland Kitchen atorvastatin (LIPITOR) 40 MG tablet Take 20 mg by mouth.  . Cyanocobalamin (VITAMIN B-12 PO) Take 1 tablet by mouth once a week.  . ondansetron (ZOFRAN) 8 MG tablet Take 1 tablet (8 mg total) by mouth every 8 (eight) hours as needed for nausea or vomiting.  . polyethylene glycol (MIRALAX / GLYCOLAX) packet Take 17 g by mouth daily. (Patient taking differently: Take 17 g by mouth daily as needed for mild constipation. )  . Valproate Sodium (DEPAKENE) 250 MG/5ML SOLN solution Take 2.5 mL twice a day for 1 week, then increase to 54mL twice a day (Patient taking  differently: Take 150 mg by mouth 2 (two) times daily. )  . Vitamin D, Ergocalciferol, (DRISDOL) 50000 units CAPS capsule TAKE 1 CAPSULE DAILY OR AS DIRECTED FOR SEVERE VIT D DEFICIENCY  . rivastigmine (EXELON) 4.6 mg/24hr Place 4.6 mg onto the skin.    Allergies  Allergen Reactions  . Amitriptyline Other (See Comments)    dysphoria  . Carafate [Sucralfate] Other (See Comments)    constipation  . Amitriptyline Other (See Comments)    dysphoria  . Carafate [Sucralfate] Other (See Comments)    constipation  . Codeine Other (See Comments)    Insomnia; agitation; restlessness   . Lexapro [Escitalopram Oxalate] Other (See Comments)    Insomnia; agitation; restlessness   . Paxil [Paroxetine Hcl] Other (See Comments)    Insomnia; agitation; restlessness   . Prednisone Other (See Comments)    Unknown reaction  . Promethazine Nausea Only  . Zoloft [Sertraline Hcl] Other (See Comments)    Insomnia; agitation; restlessness   . Codeine Other (See Comments)    Insomnia; agitation; restlessness   . Lexapro [Escitalopram Oxalate] Other (See Comments)    Insomnia; agitation; restlessness   . Paxil [Paroxetine Hcl] Other (See Comments)    Insomnia;  agitation; restlessness   . Prednisone Other (See Comments)    unknown  . Promethazine Hcl Nausea Only  . Zoloft [Sertraline Hcl] Other (See Comments)    Insomnia; agitation; restlessness    Past Medical History:  Diagnosis Date  . Anemia    Iron def   . Bacterial overgrowth syndrome   . Chronic headaches   . Dementia   . Depression   . Diverticulosis of colon (without mention of hemorrhage)   . Family history of malignant neoplasm of gastrointestinal tract   . GERD (gastroesophageal reflux disease)   . Headache(784.0)   . Hypercholesterolemia   . Hypertension   . IBS (irritable bowel syndrome)   . Osteoarthritis   . Pancreatitis   . Unspecified gastritis and gastroduodenitis without mention of hemorrhage   . Vitamin B12 deficiency     Review of Systems Unreliable per patient and negative per spouse.    Objective:   Physical Exam  BP 128/60   Pulse 67   Temp 97.6 F (36.4 C)   Resp 14   Ht 5\' 5"  (1.651 m)   Wt 132 lb 6.4 oz (60.1 kg)   SpO2 97%   BMI 22.03 kg/m   Masked facies.will not initiate or follow eye contact.  HEENT - Eac's patent. TM's Nl. EOM's full. PERRLA. NasoOroPharynx clear. Neck - supple. Nl Thyroid. Carotids 2+ & No bruits, nodes, JVD Chest - Clears. Cor - Nl HS. RRR w/o sig MGR. PP 1(+). No edema. MS- FROM w/o deformities. Generalized decrease in muscle power and bulk with increased tone and mild cog wheeling.  Stooped posture with shuffling broad based gail supported by husband. Gait Nl. Neuro - No obvious Cr N abnormalities.  Nl w/o focal abnormalities. No tremor. Psyche - Mental status flat & indifferent.  No intelligible communication or speech.  Skin - there is a small hematoma in the left  Infra- patellar area with knee ROM appearing full & normal w/o signs of effusion or skin infection.     Assessment & Plan:   1. Hematoma, Lt knee   2. Loss of weight  - discussed diet & supplements with husband. - CBC with Differential/Platelet - BASIC METABOLIC PANEL WITH GFR - TSH  3. Seizures (HCC)  - Valproic acid level  4. Dementia  - discussed with husband potential need for Hospice in the upcoming future  5. Medication management  - CBC with Differential/Platelet - BASIC METABOLIC PANEL WITH GFR - Magnesium - TSH - Valproic acid level

## 2016-09-21 LAB — BASIC METABOLIC PANEL WITH GFR
BUN: 26 mg/dL — AB (ref 7–25)
CHLORIDE: 105 mmol/L (ref 98–110)
CO2: 25 mmol/L (ref 20–31)
CREATININE: 0.97 mg/dL (ref 0.50–0.99)
Calcium: 8.5 mg/dL — ABNORMAL LOW (ref 8.6–10.4)
GFR, Est African American: 73 mL/min (ref >=60)
GFR, Est Non African American: 63 mL/min (ref >=60)
Glucose, Bld: 96 mg/dL (ref 65–99)
Potassium: 3.8 mmol/L (ref 3.5–5.3)
Sodium: 143 mmol/L (ref 135–146)

## 2016-09-21 LAB — MAGNESIUM: Magnesium: 1.9 mg/dL (ref 1.5–2.5)

## 2016-09-21 LAB — VALPROIC ACID LEVEL: Valproic Acid Lvl: 45.6 ug/mL — ABNORMAL LOW (ref 50.0–100.0)

## 2016-10-17 ENCOUNTER — Ambulatory Visit (INDEPENDENT_AMBULATORY_CARE_PROVIDER_SITE_OTHER): Payer: Medicare Other | Admitting: Physician Assistant

## 2016-10-17 ENCOUNTER — Encounter: Payer: Self-pay | Admitting: Physician Assistant

## 2016-10-17 VITALS — BP 120/70 | HR 61 | Temp 97.3°F | Resp 16 | Ht 65.0 in | Wt 127.4 lb

## 2016-10-17 DIAGNOSIS — R569 Unspecified convulsions: Secondary | ICD-10-CM | POA: Diagnosis not present

## 2016-10-17 DIAGNOSIS — F411 Generalized anxiety disorder: Secondary | ICD-10-CM

## 2016-10-17 DIAGNOSIS — K219 Gastro-esophageal reflux disease without esophagitis: Secondary | ICD-10-CM | POA: Diagnosis not present

## 2016-10-17 DIAGNOSIS — E782 Mixed hyperlipidemia: Secondary | ICD-10-CM

## 2016-10-17 DIAGNOSIS — G301 Alzheimer's disease with late onset: Secondary | ICD-10-CM

## 2016-10-17 DIAGNOSIS — I10 Essential (primary) hypertension: Secondary | ICD-10-CM | POA: Diagnosis not present

## 2016-10-17 DIAGNOSIS — Z8659 Personal history of other mental and behavioral disorders: Secondary | ICD-10-CM | POA: Diagnosis not present

## 2016-10-17 DIAGNOSIS — Z0000 Encounter for general adult medical examination without abnormal findings: Secondary | ICD-10-CM

## 2016-10-17 DIAGNOSIS — K5730 Diverticulosis of large intestine without perforation or abscess without bleeding: Secondary | ICD-10-CM

## 2016-10-17 DIAGNOSIS — E538 Deficiency of other specified B group vitamins: Secondary | ICD-10-CM | POA: Diagnosis not present

## 2016-10-17 DIAGNOSIS — E559 Vitamin D deficiency, unspecified: Secondary | ICD-10-CM

## 2016-10-17 DIAGNOSIS — M1990 Unspecified osteoarthritis, unspecified site: Secondary | ICD-10-CM

## 2016-10-17 DIAGNOSIS — D509 Iron deficiency anemia, unspecified: Secondary | ICD-10-CM | POA: Diagnosis not present

## 2016-10-17 DIAGNOSIS — Z79899 Other long term (current) drug therapy: Secondary | ICD-10-CM

## 2016-10-17 DIAGNOSIS — R6889 Other general symptoms and signs: Secondary | ICD-10-CM | POA: Diagnosis not present

## 2016-10-17 DIAGNOSIS — F0280 Dementia in other diseases classified elsewhere without behavioral disturbance: Secondary | ICD-10-CM

## 2016-10-17 DIAGNOSIS — Z0001 Encounter for general adult medical examination with abnormal findings: Secondary | ICD-10-CM

## 2016-10-17 LAB — CBC WITH DIFFERENTIAL/PLATELET
BASOS ABS: 0 {cells}/uL (ref 0–200)
Basophils Relative: 0 %
EOS ABS: 138 {cells}/uL (ref 15–500)
Eosinophils Relative: 2 %
HCT: 36.4 % (ref 35.0–45.0)
Hemoglobin: 11.9 g/dL (ref 11.7–15.5)
LYMPHS PCT: 22 %
Lymphs Abs: 1518 cells/uL (ref 850–3900)
MCH: 30.7 pg (ref 27.0–33.0)
MCHC: 32.7 g/dL (ref 32.0–36.0)
MCV: 94.1 fL (ref 80.0–100.0)
MONO ABS: 621 {cells}/uL (ref 200–950)
MPV: 10.7 fL (ref 7.5–12.5)
Monocytes Relative: 9 %
NEUTROS PCT: 67 %
Neutro Abs: 4623 cells/uL (ref 1500–7800)
Platelets: 255 10*3/uL (ref 140–400)
RBC: 3.87 MIL/uL (ref 3.80–5.10)
RDW: 12.8 % (ref 11.0–15.0)
WBC: 6.9 10*3/uL (ref 3.8–10.8)
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
## 2016-10-17 NOTE — Patient Instructions (Addendum)
Try the melatonin 5mg -20mg  dissolvable or gummy 30 mins before bed   [facility]  7 a.m.-6:30 p.m., Monday 7 a.m.-5 p.m., Tuesday-Friday Schedule an appointment by calling [phone].  Encourage you to get the 3D Mammogram  The 3D Mammogram is much more specific and sensitive to pick up breast cancer. For women with fibrocystic breast or lumpy breast it can be hard to determine if it is cancer or not but the 3D mammogram is able to tell this difference which cuts back on unneeded additional tests or scary call backs.   - over 40% increase in detection of breast cancer - over 40% reduction in false positives.  - fewer call backs - reduced anxiety - improved outcomes - [facility]

## 2016-10-18 ENCOUNTER — Telehealth: Payer: Self-pay | Admitting: Internal Medicine

## 2016-10-18 LAB — BASIC METABOLIC PANEL WITHOUT GFR
BUN: 31 mg/dL — ABNORMAL HIGH (ref 7–25)
CO2: 28 mmol/L (ref 20–31)
Calcium: 8.9 mg/dL (ref 8.6–10.4)
Chloride: 103 mmol/L (ref 98–110)
Creat: 0.94 mg/dL (ref 0.50–0.99)
GFR, Est African American: 76 mL/min
GFR, Est Non African American: 66 mL/min
Glucose, Bld: 100 mg/dL — ABNORMAL HIGH (ref 65–99)
Potassium: 3.9 mmol/L (ref 3.5–5.3)
Sodium: 139 mmol/L (ref 135–146)

## 2016-10-18 LAB — HEPATIC FUNCTION PANEL
ALT: 7 U/L (ref 6–29)
AST: 11 U/L (ref 10–35)
Albumin: 3.8 g/dL (ref 3.6–5.1)
Alkaline Phosphatase: 53 U/L (ref 33–130)
Bilirubin, Direct: 0.1 mg/dL
Indirect Bilirubin: 0.4 mg/dL (ref 0.2–1.2)
Total Bilirubin: 0.5 mg/dL (ref 0.2–1.2)
Total Protein: 6.1 g/dL (ref 6.1–8.1)

## 2016-10-18 LAB — LIPID PANEL
Cholesterol: 200 mg/dL — ABNORMAL HIGH (ref <200)
HDL: 65 mg/dL (ref >50)
LDL CALC: 116 mg/dL — AB (ref <100)
TRIGLYCERIDES: 94 mg/dL (ref <150)
Total CHOL/HDL Ratio: 3.1 Ratio (ref <5.0)
VLDL: 19 mg/dL (ref <30)

## 2016-10-18 LAB — FERRITIN: FERRITIN: 69 ng/mL (ref 20–288)

## 2016-10-18 LAB — TSH: TSH: 0.7 mIU/L

## 2016-10-18 LAB — IRON AND TIBC
%SAT: 22 % (ref 11–50)
Iron: 59 ug/dL (ref 45–160)
TIBC: 263 ug/dL (ref 250–450)
UIBC: 204 ug/dL (ref 125–400)

## 2016-10-18 LAB — MAGNESIUM: Magnesium: 2 mg/dL (ref 1.5–2.5)

## 2016-10-18 NOTE — Progress Notes (Signed)
Pt aware of lab results & voiced understanding of those results.

## 2016-10-18 NOTE — Telephone Encounter (Signed)
returning call for labs results

## 2016-10-18 NOTE — Telephone Encounter (Signed)
Spoke with husband & he is aware of lab results.

## 2016-11-04 ENCOUNTER — Other Ambulatory Visit: Payer: Self-pay | Admitting: Internal Medicine

## 2016-12-14 ENCOUNTER — Encounter: Payer: Self-pay | Admitting: Internal Medicine

## 2017-01-07 ENCOUNTER — Other Ambulatory Visit: Payer: Self-pay | Admitting: Internal Medicine

## 2017-01-17 ENCOUNTER — Encounter: Payer: Self-pay | Admitting: Internal Medicine

## 2017-01-17 ENCOUNTER — Ambulatory Visit (INDEPENDENT_AMBULATORY_CARE_PROVIDER_SITE_OTHER): Payer: Medicare Other | Admitting: Internal Medicine

## 2017-01-17 ENCOUNTER — Other Ambulatory Visit: Payer: Self-pay | Admitting: Internal Medicine

## 2017-01-17 VITALS — BP 126/78 | HR 78 | Temp 97.7°F | Resp 16 | Ht 63.0 in | Wt 116.0 lb

## 2017-01-17 DIAGNOSIS — F03918 Unspecified dementia, unspecified severity, with other behavioral disturbance: Secondary | ICD-10-CM

## 2017-01-17 DIAGNOSIS — Z1212 Encounter for screening for malignant neoplasm of rectum: Secondary | ICD-10-CM | POA: Diagnosis not present

## 2017-01-17 DIAGNOSIS — Z79899 Other long term (current) drug therapy: Secondary | ICD-10-CM

## 2017-01-17 DIAGNOSIS — I10 Essential (primary) hypertension: Secondary | ICD-10-CM

## 2017-01-17 DIAGNOSIS — R569 Unspecified convulsions: Secondary | ICD-10-CM | POA: Diagnosis not present

## 2017-01-17 DIAGNOSIS — R7309 Other abnormal glucose: Secondary | ICD-10-CM | POA: Diagnosis not present

## 2017-01-17 DIAGNOSIS — E559 Vitamin D deficiency, unspecified: Secondary | ICD-10-CM | POA: Diagnosis not present

## 2017-01-17 DIAGNOSIS — Z136 Encounter for screening for cardiovascular disorders: Secondary | ICD-10-CM | POA: Diagnosis not present

## 2017-01-17 DIAGNOSIS — E782 Mixed hyperlipidemia: Secondary | ICD-10-CM | POA: Diagnosis not present

## 2017-01-17 DIAGNOSIS — F0391 Unspecified dementia with behavioral disturbance: Secondary | ICD-10-CM | POA: Diagnosis not present

## 2017-01-17 DIAGNOSIS — R7303 Prediabetes: Secondary | ICD-10-CM | POA: Diagnosis not present

## 2017-01-17 LAB — CBC WITH DIFFERENTIAL/PLATELET
BASOS ABS: 59 {cells}/uL (ref 0–200)
Basophils Relative: 1 %
EOS ABS: 59 {cells}/uL (ref 15–500)
Eosinophils Relative: 1 %
HEMATOCRIT: 36.2 % (ref 35.0–45.0)
HEMOGLOBIN: 11.8 g/dL (ref 11.7–15.5)
LYMPHS ABS: 1593 {cells}/uL (ref 850–3900)
Lymphocytes Relative: 27 %
MCH: 29.3 pg (ref 27.0–33.0)
MCHC: 32.6 g/dL (ref 32.0–36.0)
MCV: 89.8 fL (ref 80.0–100.0)
MONO ABS: 531 {cells}/uL (ref 200–950)
MPV: 10.1 fL (ref 7.5–12.5)
Monocytes Relative: 9 %
NEUTROS ABS: 3658 {cells}/uL (ref 1500–7800)
Neutrophils Relative %: 62 %
Platelets: 275 10*3/uL (ref 140–400)
RBC: 4.03 MIL/uL (ref 3.80–5.10)
RDW: 13.8 % (ref 11.0–15.0)
WBC: 5.9 10*3/uL (ref 3.8–10.8)

## 2017-01-17 LAB — TSH: TSH: 0.61 m[IU]/L

## 2017-01-17 NOTE — Patient Instructions (Signed)

## 2017-01-17 NOTE — Progress Notes (Signed)
[facility] [name], M.D.      Helen Cabrera. Silverio Lay, P.A.-C [facility]                [address] [place], [place] [address] Telephone [phone] Telefax [phone]  Comprehensive Evaluation &  Examination     This very nice 61 y.o. MWF presents for a comprehensive evaluation and management of multiple medical co-morbidities.  Patient has been followed for HTN, Prediabetes, Hyperlipidemia and Vitamin D Deficiency.     Patient has a rapidly progressive presenile dementia classified as a "Corticobasal Degeneration " by Dr Delice Lesch who also follows her. In July last year the patient presented with new onset seizures. Patient requires total care in all ADL's provided by her caretaker husband and assisted by her children. Patient is not able to follow 1 step commands or communicate in any meaningful fashion and requires much supervision.       HTN predates since 2008. Patient's BP has been controlled at home and patient denies any cardiac symptoms as chest pain, palpitations, shortness of breath, dizziness or ankle swelling. Today's BP is at goal - 126/78.      Patient's hyperlipidemia is controlled with diet and medications. Patient denies myalgias or other medication SE's. Last lipids were not at goal: Lab Results  Component Value Date   CHOL 200 (H) 10/17/2016   HDL 65 10/17/2016   LDLCALC 116 (H) 10/17/2016   TRIG 94 10/17/2016   CHOLHDL 3.1 10/17/2016      Patient has prediabetes (A1c 6.0% in 2010) and has lost about 30-35# since that time due to declining mental status altho her caretaker husband reports she has a voracious appetite and intake. Last A1c was at goal: Lab Results  Component Value Date   HGBA1C 5.3 05/03/2016      Finally, patient has history of Vitamin D Deficiency ("15" in 2008) and last Vitamin D was at goal: Lab Results  Component Value Date   VD25OH 74 01/27/2016    Current Outpatient Prescriptions on File Prior to Visit  Medication Sig  . acetaminophen (TYLENOL) 325 MG tablet Take 650 mg by mouth every 6 (six) hours as needed for mild pain, fever or headache.  Marland Kitchen aspirin 81 MG tablet Take 81 mg by mouth at bedtime.   Marland Kitchen atenolol (TENORMIN) 100 MG tablet TAKE 1 TABLET EVERY DAY FOR BLOOD PRESSURE  . atorvastatin (LIPITOR) 40 MG tablet Take 20 mg by mouth.  . Cyanocobalamin (VITAMIN B-12 PO) Take 1 tablet by mouth once a week.  . ondansetron (ZOFRAN) 8 MG tablet Take 1 tablet (8 mg total) by mouth every 8 (eight) hours as needed for nausea or vomiting.  . polyethylene glycol (MIRALAX / GLYCOLAX) packet Take 17 g by mouth daily. (Patient taking differently: Take 17 g by mouth daily as needed for mild constipation. )  . Valproate Sodium (DEPAKENE) 250 MG/5ML SOLN solution Take 2.5 mL twice a day for 1 week, then increase to 92mL twice a day (Patient taking differently: Take 150 mg by mouth 2 (two) times daily. )  . Vitamin D, Ergocalciferol, (DRISDOL) 50000 units CAPS capsule TAKE 1 CAPSULE DAILY OR AS DIRECTED FOR SEVERE VIT D DEFICIENCY   No current facility-administered medications on file prior to visit.    Allergies  Allergen Reactions  . Amitriptyline Other (See Comments)    dysphoria  . Carafate [  Sucralfate] Other (See Comments)    constipation  . Amitriptyline Other (See Comments)    dysphoria  . Carafate [Sucralfate] Other (See Comments)    constipation  . Codeine Other (See Comments)    Insomnia; agitation; restlessness   . Lexapro [Escitalopram Oxalate] Other (See Comments)    Insomnia; agitation; restlessness   . Paxil [Paroxetine Hcl] Other (See Comments)    Insomnia; agitation; restlessness   . Prednisone Other (See Comments)    Unknown reaction  . Promethazine Nausea Only  . Zoloft [Sertraline Hcl] Other (See Comments)    Insomnia; agitation; restlessness   . Codeine Other (See Comments)    Insomnia; agitation; restlessness   .  Lexapro [Escitalopram Oxalate] Other (See Comments)    Insomnia; agitation; restlessness   . Paxil [Paroxetine Hcl] Other (See Comments)    Insomnia; agitation; restlessness   . Prednisone Other (See Comments)    unknown  . Promethazine Hcl Nausea Only  . Zoloft [Sertraline Hcl] Other (See Comments)    Insomnia; agitation; restlessness    Past Medical History:  Diagnosis Date  . Anemia    Iron def   . Bacterial overgrowth syndrome   . Chronic headaches   . Dementia   . Depression   . Diverticulosis of colon (without mention of hemorrhage)   . Family history of malignant neoplasm of gastrointestinal tract   . GERD (gastroesophageal reflux disease)   . Headache(784.0)   . Hypercholesterolemia   . Hypertension   . IBS (irritable bowel syndrome)   . Osteoarthritis   . Pancreatitis   . Unspecified gastritis and gastroduodenitis without mention of hemorrhage   . Vitamin B12 deficiency    Health Maintenance  Topic Date Due  . MAMMOGRAM  10/24/2015  . INFLUENZA VACCINE  02/01/2018 (Originally 02/21/2017)  . COLONOSCOPY  02/27/2022  . TETANUS/TDAP  04/03/2022  . Hepatitis C Screening  Completed  . HIV Screening  Completed   Immunization History  Administered Date(s) Administered  . PPD Test 04/16/2014  . Pneumococcal Polysaccharide-23 04/03/2012  . Tdap 04/03/2012   Past Surgical History:  Procedure Laterality Date  . ABDOMINAL HYSTERECTOMY    . APPENDECTOMY    . CESAREAN SECTION     x 4  . CESAREAN SECTION     x4  . PARS PLANA VITRECTOMY Right 04/02/2014   Procedure: RIGHT PARS PLANA VITRECTOMY WITH 25 GAUGE/ENDO LASER/LENSECTOMY/INSERTION OF SILICONE OIL,REPAIR COMPLEX RETINAL DETACHMENT,MEMBRANE PEEL, IRIDECTOMY;  Surgeon: Hurman Horn, MD;  Location: [facility];  Service: Ophthalmology;  Laterality: Right;   Family History  Problem Relation Age of Onset  . Diabetes Mother   . Alcohol abuse Father   . Cirrhosis Father   . Breast cancer Maternal Aunt   . Colon cancer  Maternal Aunt   . Stomach cancer Brother   . Esophageal cancer Brother   . Diabetes Brother   . Diabetes Maternal Uncle   . Heart disease Unknown        grandfather/grandmother   Social History  Substance Use Topics  . Smoking status: Never Smoker  . Smokeless tobacco: Never Used  . Alcohol use 0.0 oz/week     Comment: occ- very rarely    ROS  Patient 's severe Dementia precludes reliable responses, but 10 point systems review negative per her husband.    Physical Exam  BP 126/78   Pulse 78   Temp 97.7 F (36.5 C)   Resp 16   Ht 5\' 3"  (1.6 m)   Wt 116 lb (  52.6 kg)   BMI 20.55 kg/m   General Appearance: Appears chronically ill and in no apparent distress.Masked facies. Eyes: Dense Rt corneal scarring obscuring the pupil an Lt pupil is responsive.   Sinuses: No frontal/maxillary tenderness ENT/Mouth: EACs patent / TMs  nl. Nares clear without erythema, swelling, mucoid exudates. Oral hygiene is good. No erythema, swelling, or exudate. Tongue normal. Hearing seems intact. Neck: Supple, thyroid normal. No bruits, nodes or JVD. Respiratory: Respiratory effort normal.  BS equal and clear bilateral without rales, rhonci, wheezing or stridor. Cardio: Heart sounds are normal with regular rate and rhythm and no murmurs, rubs or gallops. Peripheral pulses are normal and equal bilaterally without edema. No aortic or femoral bruits. Chest: symmetric with normal excursions and percussion. Breasts: Symmetric, without lumps, nipple discharge, retractions, or fibrocystic changes.  Abdomen: Flat, soft with bowel sounds active. Non-tender w/o hernias, masses or organomegaly.  Musculoskeletal: Generalized decrease in muscle power and bulk with increased tone and mild cog wheeling and occasional myoclonic type twitching.  Stooped posture with shuffling broad based gait.  Skin: Warm and dry without rashes, lesions, cyanosis, clubbing or  ecchymosis.  Neuro: Cranial nerves intact, reflexes  hyperactive. Sensation intact.   Assessment and Plan  1. Essential hypertension  - EKG 12-Lead - Urinalysis, Routine w reflex microscopic - Microalbumin / creatinine urine ratio - CBC with Differential/Platelet - BASIC METABOLIC PANEL WITH GFR - Magnesium - TSH  2. Hyperlipidemia, mixed  - EKG 12-Lead - Hepatic function panel - Lipid panel - TSH  3. Prediabetes  - EKG 12-Lead - Hemoglobin A1c - Insulin, random  4. Vitamin D deficiency  - VITAMIN D 25 Hydroxy  5. Abnormal glucose  - Hemoglobin A1c - Insulin, random  6. Seizure ([place])  - Valproic acid level  7. Presenile dementia with behavioral disturbance  - Long discussion w/husband re: her guarded outlook & prognosis and he is agreeable for Palliative Care /Hospice consultation.   8. Screening for rectal cancer  - POC Hemoccult Bld/Stl  9. Screening for ischemic heart disease  - EKG 12-Lead  10. Medication management  - Urinalysis, Routine w reflex microscopic - Microalbumin / creatinine urine ratio - CBC with Differential/Platelet - BASIC METABOLIC PANEL WITH GFR - Hepatic function panel - Magnesium - Lipid panel - TSH - Hemoglobin A1c - Insulin, random - VITAMIN D 25 Hydroxy       Patient was counseled in prudent diet to achieve/maintain BMI less than 25 for weight control, BP monitoring, regular exercise and medications. Discussed med's effects and SE's. Screening labs and tests as requested with regular follow-up as recommended. Over 40 minutes of exam, counseling, chart review and high complex critical decision making was performed.

## 2017-01-18 LAB — LIPID PANEL
CHOL/HDL RATIO: 3.2 ratio (ref <5.0)
Cholesterol: 233 mg/dL — ABNORMAL HIGH (ref <200)
HDL: 72 mg/dL (ref >50)
LDL CALC: 144 mg/dL — AB (ref <100)
Triglycerides: 85 mg/dL (ref <150)
VLDL: 17 mg/dL (ref <30)

## 2017-01-18 LAB — HEPATIC FUNCTION PANEL
ALBUMIN: 3.7 g/dL (ref 3.6–5.1)
ALK PHOS: 50 U/L (ref 33–130)
ALT: 7 U/L (ref 6–29)
AST: 11 U/L (ref 10–35)
BILIRUBIN DIRECT: 0.1 mg/dL (ref <=0.2)
Indirect Bilirubin: 0.4 mg/dL (ref 0.2–1.2)
Total Bilirubin: 0.5 mg/dL (ref 0.2–1.2)
Total Protein: 6.1 g/dL (ref 6.1–8.1)

## 2017-01-18 LAB — MAGNESIUM: MAGNESIUM: 1.9 mg/dL (ref 1.5–2.5)

## 2017-01-18 LAB — BASIC METABOLIC PANEL WITH GFR
BUN: 23 mg/dL (ref 7–25)
CHLORIDE: 103 mmol/L (ref 98–110)
CO2: 26 mmol/L (ref 20–31)
CREATININE: 0.81 mg/dL (ref 0.50–0.99)
Calcium: 8.8 mg/dL (ref 8.6–10.4)
GFR, Est African American: >89 mL/min (ref >=60)
GFR, Est Non African American: 79 mL/min (ref >=60)
Glucose, Bld: 89 mg/dL (ref 65–99)
Potassium: 4 mmol/L (ref 3.5–5.3)
Sodium: 141 mmol/L (ref 135–146)

## 2017-01-18 LAB — INSULIN, RANDOM: Insulin: 7.1 u[IU]/mL (ref 2.0–19.6)

## 2017-01-18 LAB — VITAMIN D 25 HYDROXY (VIT D DEFICIENCY, FRACTURES): VIT D 25 HYDROXY: 107 ng/mL — AB (ref 30–100)

## 2017-01-18 LAB — VALPROIC ACID LEVEL: Valproic Acid Lvl: 43.5 mg/L — ABNORMAL LOW (ref 50.0–100.0)

## 2017-01-18 LAB — HEMOGLOBIN A1C
HEMOGLOBIN A1C: 5.5 % (ref <5.7)
Mean Plasma Glucose: 111 mg/dL

## 2017-01-31 DIAGNOSIS — R531 Weakness: Secondary | ICD-10-CM | POA: Diagnosis not present

## 2017-02-04 ENCOUNTER — Encounter (HOSPITAL_COMMUNITY): Payer: Self-pay

## 2017-02-04 ENCOUNTER — Emergency Department (HOSPITAL_COMMUNITY): Payer: Medicare Other

## 2017-02-04 ENCOUNTER — Emergency Department (HOSPITAL_COMMUNITY)
Admission: EM | Admit: 2017-02-04 | Discharge: 2017-02-05 | Disposition: A | Discharge status: Home / Self Care | Payer: Medicare Other | Attending: Emergency Medicine | Admitting: Emergency Medicine

## 2017-02-04 DIAGNOSIS — I10 Essential (primary) hypertension: Secondary | ICD-10-CM | POA: Diagnosis not present

## 2017-02-04 DIAGNOSIS — Z23 Encounter for immunization: Secondary | ICD-10-CM | POA: Diagnosis not present

## 2017-02-04 DIAGNOSIS — Y939 Activity, unspecified: Secondary | ICD-10-CM | POA: Insufficient documentation

## 2017-02-04 DIAGNOSIS — S0101XA Laceration without foreign body of scalp, initial encounter: Secondary | ICD-10-CM | POA: Diagnosis not present

## 2017-02-04 DIAGNOSIS — Y998 Other external cause status: Secondary | ICD-10-CM | POA: Diagnosis not present

## 2017-02-04 DIAGNOSIS — E7800 Pure hypercholesterolemia, unspecified: Secondary | ICD-10-CM | POA: Insufficient documentation

## 2017-02-04 DIAGNOSIS — Z79899 Other long term (current) drug therapy: Secondary | ICD-10-CM | POA: Insufficient documentation

## 2017-02-04 DIAGNOSIS — Y92000 Kitchen of unspecified non-institutional (private) residence as  the place of occurrence of the external cause: Secondary | ICD-10-CM | POA: Insufficient documentation

## 2017-02-04 DIAGNOSIS — M25512 Pain in left shoulder: Secondary | ICD-10-CM | POA: Diagnosis not present

## 2017-02-04 DIAGNOSIS — W19XXXA Unspecified fall, initial encounter: Secondary | ICD-10-CM | POA: Insufficient documentation

## 2017-02-04 DIAGNOSIS — S4992XA Unspecified injury of left shoulder and upper arm, initial encounter: Secondary | ICD-10-CM | POA: Diagnosis not present

## 2017-02-04 DIAGNOSIS — F0390 Unspecified dementia without behavioral disturbance: Secondary | ICD-10-CM | POA: Diagnosis not present

## 2017-02-04 MED ORDER — LIDOCAINE-EPINEPHRINE-TETRACAINE (LET) SOLUTION
3.0000 mL | Freq: Once | NASAL | Status: AC
  Administered 2017-02-04: 3 mL via TOPICAL
  Filled 2017-02-04: qty 3

## 2017-02-04 MED ORDER — ACETAMINOPHEN 160 MG/5ML PO SOLN
500.0000 mg | Freq: Once | ORAL | Status: AC
  Administered 2017-02-04: 500 mg via ORAL
  Filled 2017-02-04: qty 20.3

## 2017-02-04 NOTE — ED Provider Notes (Signed)
[facility] DEPT Provider Note   CSN: [id number] Arrival date & time: 02/04/17  2144     History   Chief Complaint Chief Complaint  Patient presents with  . Head Laceration    HPI   Blood pressure (!) 145/96, pulse 87, temperature 98.2 F (36.8 C), temperature source Oral, resp. rate 16, SpO2 97 %.  Helen Cabrera is a 61 y.o. female with past medical history significant for dementia, accompanied by family complaining of a fall occurring prior to arrival tonight, this was not witnessed. Her husband found her on the kitchen floor she hit her head on the wooden floor, he doesn't think there was any loss of consciousness. She's not anticoagulated. She is mentating at her baseline. He is unsure when her last tetanus shot is. He notes an abrasion to her left scapular area and she has a large laceration on her scalp, bleeding was not difficult to control at home. Level 5 caveat secondary to dementia.   Past Medical History:  Diagnosis Date  . Anemia    Iron def   . Bacterial overgrowth syndrome   . Chronic headaches   . Dementia   . Depression   . Diverticulosis of colon (without mention of hemorrhage)   . Family history of malignant neoplasm of gastrointestinal tract   . GERD (gastroesophageal reflux disease)   . Headache(784.0)   . Hypercholesterolemia   . Hypertension   . IBS (irritable bowel syndrome)   . Osteoarthritis   . Pancreatitis   . Unspecified gastritis and gastroduodenitis without mention of hemorrhage   . Vitamin B12 deficiency     Patient Active Problem List   Diagnosis Date Noted  . Presenile dementia with behavioral disturbance 01/17/2017  . Left arm weakness 03/02/2016  . History of depression 03/02/2016  . Seizure ([place]) 03/02/2016  . SDAT (senile dementia of Alzheimer's type) 02/15/2014  . Vitamin D deficiency 10/09/2013  . Medication management 10/09/2013  . Iron deficiency anemia 01/25/2010  . Vitamin B 12 deficiency 10/11/2009  .  Hyperlipidemia 10/11/2009  . Anxiety state 10/11/2009  . Essential hypertension 10/11/2009  . GERD 10/11/2009  . Diverticulosis of colon 10/11/2009  . Osteoarthritis 10/11/2009    Past Surgical History:  Procedure Laterality Date  . ABDOMINAL HYSTERECTOMY    . APPENDECTOMY    . CESAREAN SECTION     x 4  . CESAREAN SECTION     x4  . PARS PLANA VITRECTOMY Right 04/02/2014   Procedure: RIGHT PARS PLANA VITRECTOMY WITH 25 GAUGE/ENDO LASER/LENSECTOMY/INSERTION OF SILICONE OIL,REPAIR COMPLEX RETINAL DETACHMENT,MEMBRANE PEEL, IRIDECTOMY;  Surgeon: Hurman Horn, MD;  Location: [facility];  Service: Ophthalmology;  Laterality: Right;    OB History    No data available       Home Medications    Prior to Admission medications   Medication Sig Start Date End Date Taking? Authorizing Provider  atenolol (TENORMIN) 100 MG tablet TAKE 1 TABLET EVERY DAY FOR BLOOD PRESSURE Patient taking differently: TAKE 50 mg EVERY DAY FOR BLOOD PRESSURE 01/08/17  Yes Vicie Mutters, PA-C  Valproate Sodium (DEPAKENE) 250 MG/5ML SOLN solution Take 2.5 mL twice a day for 1 week, then increase to 48mL twice a day Patient taking differently: Take 150 mg by mouth 2 (two) times daily.  05/23/16  Yes Cameron Sprang, MD  ondansetron (ZOFRAN) 8 MG tablet Take 1 tablet (8 mg total) by mouth every 8 (eight) hours as needed for nausea or vomiting. Patient not taking: Reported on 02/04/2017 06/02/14  Couillard, Jennifer, PA-C  polyethylene glycol (MIRALAX / GLYCOLAX) packet Take 17 g by mouth daily. Patient not taking: Reported on 02/04/2017 05/03/16   Vicie Mutters, PA-C  Vitamin D, Ergocalciferol, (DRISDOL) 50000 units CAPS capsule TAKE 1 CAPSULE DAILY OR AS DIRECTED FOR SEVERE VIT D DEFICIENCY Patient not taking: Reported on 02/04/2017 08/29/15   [name], MD    Family History Family History  Problem Relation Age of Onset  . Diabetes Mother   . Alcohol abuse Father   . Cirrhosis Father   . Breast cancer  Maternal Aunt   . Colon cancer Maternal Aunt   . Stomach cancer Brother   . Esophageal cancer Brother   . Diabetes Brother   . Diabetes Maternal Uncle   . Heart disease Unknown        grandfather/grandmother    Social History Social History  Substance Use Topics  . Smoking status: Never Smoker  . Smokeless tobacco: Never Used  . Alcohol use 0.0 oz/week     Comment: occ- very rarely     Allergies   Amitriptyline; Carafate [sucralfate]; Amitriptyline; Carafate [sucralfate]; Codeine; Lexapro [escitalopram oxalate]; Paxil [paroxetine hcl]; Prednisone; Promethazine; Zoloft [sertraline hcl]; Codeine; Lexapro [escitalopram oxalate]; Paxil [paroxetine hcl]; Prednisone; Promethazine hcl; and Zoloft [sertraline hcl]   Review of Systems Review of Systems  Unable to perform ROS: Dementia     Physical Exam Updated Vital Signs BP (!) 153/69 (BP Location: Left Arm)   Pulse 88   Temp 98.2 F (36.8 C) (Oral)   Resp 14   SpO2 100%   Physical Exam  Constitutional: She appears well-developed and well-nourished. No distress.  HENT:  Head: Normocephalic.  Mouth/Throat: Oropharynx is clear and moist.  Right cornea opacified, left reactive.  No intraoral trauma, no nosebleeds, no hemotympanum  8 cm full-thickness scalp laceration in the occipital area.  Eyes: Pupils are equal, round, and reactive to light. EOM are normal.  Neck: Normal range of motion.  Cardiovascular: Normal rate, regular rhythm and intact distal pulses.   Pulmonary/Chest: Effort normal and breath sounds normal.  Abdominal: Soft. There is no tenderness.  Musculoskeletal: Normal range of motion.  No obvious C-spine tenderness palpation, no step-offs.  Neurological: She is alert.  Nonverbal, moving all extremities, no focal bony tenderness to major joints  Skin: She is not diaphoretic.  Psychiatric: She has a normal mood and affect.  Nursing note and vitals reviewed.    ED Treatments / Results  Labs (all labs  ordered are listed, but only abnormal results are displayed) Labs Reviewed - No data to display  EKG  EKG Interpretation None       Radiology Dg Shoulder Left  Result Date: 02/04/2017 CLINICAL DATA:  Left shoulder pain after fall this evening. EXAM: LEFT SHOULDER - 2+ VIEW COMPARISON:  None. FINDINGS: There is no evidence of fracture or dislocation. There is no evidence of arthropathy or other focal bone abnormality. Soft tissues are unremarkable. IMPRESSION: Negative. Electronically Signed   By: Ashley Royalty M.D.   On: 02/04/2017 23:16    Procedures .Marland KitchenLaceration Repair Date/Time: 02/05/2017 1:08 AM Performed by: Monico Blitz Authorized by: Monico Blitz   Consent:    Consent given by:  Spouse Anesthesia (see MAR for exact dosages):    Anesthesia method:  Topical application   Topical anesthetic:  LET Laceration details:    Location:  Scalp   Scalp location:  Occipital   Length (cm):  8 Repair type:    Repair type:  Simple Pre-procedure details:  Preparation:  Patient was prepped and draped in usual sterile fashion Treatment:    Area cleansed with:  Saline   Amount of cleaning:  Standard Skin repair:    Repair method:  Staples Approximation:    Approximation:  Close   Vermilion border: well-aligned   Post-procedure details:    Dressing:  Antibiotic ointment   (including critical care time)  Medications Ordered in ED Medications  bacitracin ointment 1 application (not administered)  lidocaine-EPINEPHrine-tetracaine (LET) solution (3 mLs Topical Given 02/04/17 2343)  acetaminophen (TYLENOL) solution 500 mg (500 mg Oral Given 02/04/17 2343)  Tdap (BOOSTRIX) injection 0.5 mL (0.5 mLs Intramuscular Given 02/05/17 0130)     Initial Impression / Assessment and Plan / ED Course  I have reviewed the triage vital signs and the nursing notes.  Pertinent labs & imaging results that were available during my care of the patient were reviewed by me and considered in  my medical decision making (see chart for details).     Vitals:   02/04/17 2151 02/05/17 0106  BP: (!) 145/96 (!) 153/69  Pulse: 87 88  Resp: 16 14  Temp: 98.2 F (36.8 C)   TempSrc: Oral   SpO2: 97% 100%    Medications  bacitracin ointment 1 application (not administered)  lidocaine-EPINEPHrine-tetracaine (LET) solution (3 mLs Topical Given 02/04/17 2343)  acetaminophen (TYLENOL) solution 500 mg (500 mg Oral Given 02/04/17 2343)  Tdap (BOOSTRIX) injection 0.5 mL (0.5 mLs Intramuscular Given 02/05/17 0130)    Helen Cabrera is 61 y.o. female presenting with Fall at home, she has dementia. She is mentating at her baseline as per family members. She has a large laceration to the scalp. She also has a small abrasion on the left scapular area. Tetanus is updated and wound is cleaned and closed and counseled her husband on wound care.  After having discussion with husband, he states that he is medical power of attorney and this patient is a DNR/DNI. Unfortunately, she cannot remain still enough to obtain neuro imaging. Her husband understands that we cannot rule out neurohemorrhaging. He understands the risks associated with this. He chooses not to go forward with sedation at this time. He had we have had an extensive discussion on return precautions and he verbalizes understanding and teach back technique.  Evaluation does not show pathology that would require ongoing emergent intervention or inpatient treatment. Pt is hemodynamically stable and mentating appropriately. Discussed findings and plan with patient/guardian, who agrees with care plan. All questions answered. Return precautions discussed and outpatient follow up given.      Final Clinical Impressions(s) / ED Diagnoses   Final diagnoses:  Laceration of scalp, initial encounter  Fall, initial encounter    New Prescriptions Discharge Medication List as of 02/05/2017  1:33 AM       Renji Berwick, Charna Elizabeth 02/05/17 0232     Horton, Barbette Hair, MD 02/05/17 0630

## 2017-02-04 NOTE — ED Triage Notes (Signed)
Onset 30 min PTA pt fell backwards landing on head.  Around 4" Laceration to back of head.  Bleeding controlled.

## 2017-02-04 NOTE — ED Notes (Signed)
Dr. Vanita Panda notified of pt hx of dementia and large laceration to scalp. VO for head CT received

## 2017-02-05 ENCOUNTER — Emergency Department (HOSPITAL_COMMUNITY): Payer: Medicare Other

## 2017-02-05 DIAGNOSIS — S0101XA Laceration without foreign body of scalp, initial encounter: Secondary | ICD-10-CM | POA: Diagnosis not present

## 2017-02-05 MED ORDER — TETANUS-DIPHTH-ACELL PERTUSSIS 5-2.5-18.5 LF-MCG/0.5 IM SUSP
0.5000 mL | Freq: Once | INTRAMUSCULAR | Status: AC
  Administered 2017-02-05: 0.5 mL via INTRAMUSCULAR
  Filled 2017-02-05: qty 0.5

## 2017-02-05 MED ORDER — BACITRACIN ZINC 500 UNIT/GM EX OINT: 1.0000 "application " | TOPICAL_OINTMENT | Freq: Once | CUTANEOUS | Status: DC

## 2017-02-05 NOTE — Discharge Instructions (Addendum)
Hesitate to return to the emergency department for any new, worsening or concerning symptoms.   Keep wound dry and do not remove dressing for 24 hours if possible. After that, wash gently morning and night (every 12 hours) with soap and water. Use a topical antibiotic ointment and cover with a bandaid or gauze.    Do NOT use rubbing alcohol or hydrogen peroxide, do not soak the area   Present to your primary care doctor or the urgent care of your choice, or the ED for suture removal in 10 days.   Every attempt was made to remove foreign body (contaminants) from the wound.  However, there is always a chance that some may remain in the wound. This can  increase your risk of infection.   If you see signs of infection (warmth, redness, tenderness, pus, sharp increase in pain, fever, red streaking in the skin) immediately return to the emergency department.

## 2017-02-05 NOTE — ED Notes (Signed)
EDP aware pt unable to tolerate CT scan

## 2017-02-21 ENCOUNTER — Ambulatory Visit (INDEPENDENT_AMBULATORY_CARE_PROVIDER_SITE_OTHER): Payer: Medicare Other | Admitting: Physician Assistant

## 2017-02-21 VITALS — BP 118/66 | Temp 97.7°F | Wt 116.0 lb

## 2017-02-21 DIAGNOSIS — Z4802 Encounter for removal of sutures: Secondary | ICD-10-CM | POA: Diagnosis not present

## 2017-02-22 NOTE — Progress Notes (Signed)
Subjective:    Helen Cabrera is a 61 y.o. female who obtained a laceration 10 days ago, which required closure with 5 sutures.She denies pain, redness, or drainage from the wound.  BMI is Body mass index is 20.55 kg/m., she is working on diet and exercise. Weight is holding steady at this time. Had hospice evaluation but did not qualify at this time, will monitor.  Wt Readings from Last 3 Encounters:  02/21/17 116 lb (52.6 kg)  01/17/17 116 lb (52.6 kg)  10/17/16 127 lb 6.4 oz (57.8 kg)    The following portions of the patient's history were reviewed and updated as appropriate: allergies, current medications, past family history, past medical history, past social history, past surgical history and problem list.  Review of Systems Pertinent items are noted in HPI.    Objective:    BP 118/66   Temp 97.7 F (36.5 C)   Wt 116 lb (52.6 kg)   BMI 20.55 kg/m  Injury exam:  A 7 cm laceration noted on the right posterior scalp is healing well, without evidence of infection.    Assessment:    Laceration is healing well, without evidence of infection.    Plan:     1. 5 sutures were removed. 2. Wound care discussed. 3. Follow up as needed.

## 2017-02-26 ENCOUNTER — Ambulatory Visit (INDEPENDENT_AMBULATORY_CARE_PROVIDER_SITE_OTHER): Payer: Medicare Other | Admitting: Neurology

## 2017-02-26 VITALS — HR 70 | Ht 63.0 in | Wt 119.4 lb

## 2017-02-26 DIAGNOSIS — G3185 Corticobasal degeneration: Secondary | ICD-10-CM

## 2017-02-26 DIAGNOSIS — G40309 Generalized idiopathic epilepsy and epileptic syndromes, not intractable, without status epilepticus: Secondary | ICD-10-CM | POA: Diagnosis not present

## 2017-02-26 DIAGNOSIS — F482 Pseudobulbar affect: Secondary | ICD-10-CM | POA: Diagnosis not present

## 2017-02-26 MED ORDER — VALPROATE SODIUM 250 MG/5ML PO SOLN: ORAL | 6 refills | Status: DC

## 2017-02-26 NOTE — Progress Notes (Signed)
NEUROLOGY FOLLOW UP OFFICE NOTE  Helen Cabrera [id number]  HISTORY OF PRESENT ILLNESS: I had the pleasure of seeing Helen Cabrera in follow-up in the neurology clinic on 02/26/2017.  The patient was last seen 6 months ago for new onset seizures in the setting of a neurodegenerative condition (?corticobasal degeneration). She is again accompanied by her husband who helps supplement the history today. Since her last visit, her husband denies any seizures, she is taking Depakote 261m/5mL, taking 352mBID with no side effects. She continues to have 24/7 care. She is minimally verbal in the office today, she said "hi" when husband asked her to say "hi" to me. Otherwise she walks aimlessless around the room, suddenly bursting out crying with head bent down, left arm bent at the elbow with myoclonic jerks. Her husband reports a fall last 7/15, he was right beside her but was unable to catch her. She required several scalp staples. He has noticed she is more stiff and rigid. She tries to open doors in the house. She cannot eat with a spoon but can feed herself when food is put in her right hand. She has incontinence and wears pull ups. Her husband reports sleep is good, he gives her liquid Tylenol every night. She is not as irritable as she used to be, and talks more per husband.   HPI 13/31/2017: This is a pleasant [age]o RH woman with a history of early-onset dementia, hypertension, hyperlipidemia, who presented for new onset seizures. Her husband reports 2 seizures, one in July, and another in August, but on review of records, it appears the [facility] event which [facility]. They were at [place] courthouse when he saw her fall to the ground with whole body jerking, eyes rolled back, gasping breathing for 10-15 minutes. [facility] which I personally reviewed, no acute  changes seen. There was mild diffuse atrophy with ventriculomegaly. Routine EEG showed mild posterior background slowing. She was discharged home with no seizure medication since this was the first event. She had another episode which her husband reports [facility], but no records seen. PCP note in September indicated her husband reported that they [place] the [place] and sitting in the backseat when she suddenly gasped and fell on his shoulder, unresponsive with a little jerking, less intense than the first event. [facility] Rivastigmine patch was discontinued and Keppra was started. She has been taking Keppra 50062mID for the past month or so with no further similar episodes, however her husband is concerned that she is getting more aggravated with her family. Prior to starting Keppra, she had some behavioral issues with paranoia, but her husband reports she is now getting physical with her family, even with her husband. She tells him she is going to leave or tells people to get out of the house. She has a hard time staying still, always wanting to walk around, then getting aggravated when redirected. Her sense of direction is now "way off." He ran out of Keppra 10 days ago and called his PCP, but tells me that she has been taking ?Cephalexin?. He did not bring the bottle and will confirm with us.[place] No prior history of seizures. As far as her husband knows, she had a normal birth and  early development.  There is no history of febrile convulsions, CNS infections such as meningitis/encephalitis, significant traumatic brain injury, neurosurgical procedures, or family history of seizures. There is a strong family history of Alzheimer's disease in her mother (diagnosed before age 60), maternal aunt and uncle. She had been working for [employer] for 40 years but started making mistakes around age 61 or 25. She initially saw neurologist  Dr. Krista Blue, records unavailable for review, then saw Dr. Dimas Millin at [facility] in 2013. At that time, notes indicate she had a head CT showing a 1cm right tentorial meningioma confirmed by MRI. She had normal B12, ESR, RPR, ANA, TSH, Sjogren's antibodies, and SPEP. She had neuropsychological evaluation which suggested that depression and stress were likely playing a role. It was felt that symptoms likely represent early Alzheimer's dementia worsened by underlying psychiatric issues. MMSE in 01/2013 was 24/30. She was started on Aricept and Namenda, which caused side effects. She has been on the Exelon patch for at least 2 years. She is dependent on her husband for bathing, dressing. She can feed herself but needs supervision. They have started using Depends because she was wetting the bed at night, but is able to go to the bathroom during the daytime. She has hallucinations of people in the room only at night. She wakes up around 3am, agitated for an hour then goes back to sleep. Her husband has noticed that when she wakes up she cannot communicate as well, stammering and unable to finish her sentences, then once more awake, she is not as bad. She is noted to have myoclonic jerking of the left > right UE, left leg, which her husband reports started while she was still working at age 61, she would drop a drink from her hand. This has progressively worsened where sometimes she has a hand tremor.  PAST MEDICAL HISTORY: Past Medical History:  Diagnosis Date  . Anemia    Iron def   . Bacterial overgrowth syndrome   . Chronic headaches   . Dementia   . Depression   . Diverticulosis of colon (without mention of hemorrhage)   . Family history of malignant neoplasm of gastrointestinal tract   . GERD (gastroesophageal reflux disease)   . Headache(784.0)   . Hypercholesterolemia   . Hypertension   . IBS (irritable bowel syndrome)   . Osteoarthritis   . Pancreatitis   . Unspecified gastritis and gastroduodenitis  without mention of hemorrhage   . Vitamin B12 deficiency     MEDICATIONS:   Outpatient Encounter Prescriptions as of 02/26/2017  Medication Sig Note  . Acetaminophen (TYLENOL 8 HOUR PO) Take by mouth. 02/21/2017: Gives 50m at night to help with sleep  . atenolol (TENORMIN) 100 MG tablet TAKE 1 TABLET EVERY DAY FOR BLOOD PRESSURE (Patient taking differently: TAKE 50 mg EVERY DAY FOR BLOOD PRESSURE) 02/04/2017: Crushes and mixes in the Valproate Sodium Liquid  . OVER THE COUNTER MEDICATION  02/21/2017: Vitamin B reported by pt's husband  . polyethylene glycol (MIRALAX / GLYCOLAX) packet Take 17 g by mouth daily. 02/21/2017: As needed  . Valproate Sodium (DEPAKENE) 250 MG/5ML SOLN solution Take 2.5 mL twice a day for 1 week, then increase to 580mtwice a day (Patient taking differently: Take 150 mg by mouth 2 (two) times daily. )   . Vitamin D, Ergocalciferol, (DRISDOL) 50000 units CAPS capsule TAKE 1 CAPSULE DAILY OR AS DIRECTED FOR SEVERE VIT D DEFICIENCY    No facility-administered encounter medications on file as of 02/26/2017.  ALLERGIES: Allergies  Allergen Reactions  . Amitriptyline Other (See Comments)    dysphoria  . Carafate [Sucralfate] Other (See Comments)    constipation  . Amitriptyline Other (See Comments)    dysphoria  . Carafate [Sucralfate] Other (See Comments)    constipation  . Codeine Other (See Comments)    Insomnia; agitation; restlessness   . Lexapro [Escitalopram Oxalate] Other (See Comments)    Insomnia; agitation; restlessness   . Paxil [Paroxetine Hcl] Other (See Comments)    Insomnia; agitation; restlessness   . Prednisone Other (See Comments)    Unknown reaction  . Promethazine Nausea Only  . Zoloft [Sertraline Hcl] Other (See Comments)    Insomnia; agitation; restlessness   . Codeine Other (See Comments)    Insomnia; agitation; restlessness   . Lexapro [Escitalopram Oxalate] Other (See Comments)    Insomnia; agitation; restlessness   . Paxil [Paroxetine  Hcl] Other (See Comments)    Insomnia; agitation; restlessness   . Prednisone Other (See Comments)    unknown  . Promethazine Hcl Nausea Only  . Zoloft [Sertraline Hcl] Other (See Comments)    Insomnia; agitation; restlessness     FAMILY HISTORY: Family History  Problem Relation Age of Onset  . Diabetes Mother   . Alcohol abuse Father   . Cirrhosis Father   . Breast cancer Maternal Aunt   . Colon cancer Maternal Aunt   . Stomach cancer Brother   . Esophageal cancer Brother   . Diabetes Brother   . Diabetes Maternal Uncle   . Heart disease Unknown        grandfather/grandmother    SOCIAL HISTORY: Social History   Social History  . Marital status: Married    Spouse name: N/A  . Number of children: 4  . Years of education: N/A   Occupational History  . CUSTOMER SERVICE [employer]   Social History Main Topics  . Smoking status: Never Smoker  . Smokeless tobacco: Never Used  . Alcohol use 0.0 oz/week     Comment: occ- very rarely  . Drug use: No  . Sexual activity: Not on file   Other Topics Concern  . Not on file   Social History Narrative   ** Merged History Encounter **        REVIEW OF SYSTEMS unable to obtain due to mental status  PHYSICAL EXAM: Vitals:   02/26/17 1450  Pulse: 70   General: She is pacing around the room, comes up to me when instructed by husband and says "hi" then would suddenly cry then stop quickly. Does not follow any commands. She is again noted to have myoclonic jerks of the left hand and left side of face. Head:  Normocephalic/atraumatic Eyes: Right eye opaque Neck: supple, no paraspinal tenderness, full range of motion Back: No paraspinal tenderness Heart: regular rate and rhythm Lungs: Clear to auscultation bilaterally. Vascular: No carotid bruits. Skin/Extremities: No rash, no edema Neurological Exam: Mental status: awake and alert, no verbal output today, does not follow commands Cranial nerves: CN I: not tested CN  II: right eye opaque, no light perception. Left pupil round and reactive to light, visual fields intact but again appears to have some left-sided neglect CN III, IV, VI:  full range of motion, no nystagmus, no ptosis CN V: facial sensation intact CN VII: upper and lower face symmetric CN VIII: hearing intact to finger rub CN IX, X: gag intact, uvula midline CN XI: sternocleidomastoid and trapezius muscles intact CN XII: tongue midline Bulk &  Tone: increased with +cogwheeling bilaterally, no fasciculations. Motor: 5/5 on right, 4/5 on left with left arm kept at flexion (similar to prior) Sensation: unable to test due to mental status Deep Tendon Reflexes: +2 throughout, no ankle clonus Plantar responses: downgoing bilaterally Cerebellar: no incoordination on finger to nose testing Gait: slow and cautious, hunched, reduced arm swing bilaterally, keeping left arm flexed (similar to prior) Tremor: none noted today  IMPRESSION: This is a 61 yo RH woman with a history of early-onset dementia, hypertension, hyperlipidemia, with new onset seizures. In the early stages of her dementia, she was diagnosed with early onset Alzheimer's disease, however presentation today with asymmetric exam, left hemineglect, myoclonus, cogwheeling, and report of tremor, suggestive of corticobasal degeneration. Seizures are not typically seen with this condition, however patients with dementia do have a higher frequency of seizures. The two episodes are suggestive of generalized seizures, her routine EEG reported diffuse slowing, MRI brain shows diffuse atrophy. She has been switched to Depakote, no further seizures, continue current dose. She is more restless today, pacing throughout the visit then suddenly bursting out crying. We will do a trial of Nuedexta, side effects were discussed. Her husband was given samples and will call our office if he notices any improvement so refills can be sent. Continue supportive care. She  will follow-up in 6 months.   Thank you for allowing me to participate in her care.  Please do not hesitate to call for any questions or concerns.  The duration of this appointment visit was 25 minutes of face-to-face time with the patient.  Greater than 50% of this time was spent in counseling, explanation of diagnosis, planning of further management, and coordination of care.   Ellouise Newer, M.D.   CC: Dr. Melford Aase

## 2017-02-26 NOTE — Patient Instructions (Signed)
1. Try the Nuedexta and let us know if helpful, we will send refills if this is helpful 2. Continue all your other medications 3. Follow-up in 6 months, call for any changes

## 2017-02-27 ENCOUNTER — Encounter: Payer: Self-pay | Admitting: Neurology

## 2017-02-27 DIAGNOSIS — G40309 Generalized idiopathic epilepsy and epileptic syndromes, not intractable, without status epilepticus: Secondary | ICD-10-CM | POA: Insufficient documentation

## 2017-02-27 DIAGNOSIS — G3185 Corticobasal degeneration: Secondary | ICD-10-CM | POA: Insufficient documentation

## 2017-03-12 ENCOUNTER — Telehealth: Payer: Self-pay | Admitting: Neurology

## 2017-03-12 ENCOUNTER — Other Ambulatory Visit: Payer: Self-pay

## 2017-03-12 MED ORDER — DEXTROMETHORPHAN-QUINIDINE 20-10 MG PO CAPS: ORAL_CAPSULE | ORAL | 6 refills | Status: DC

## 2017-03-12 NOTE — Telephone Encounter (Signed)
Pls let him know I sent the Rx, thanks!

## 2017-03-12 NOTE — Addendum Note (Signed)
Addended by: Cameron Sprang on: 03/12/2017 02:05 PM   Modules accepted: Orders

## 2017-03-12 NOTE — Telephone Encounter (Signed)
Patient husband called and needs [place] to call in the medication she gave them samples of for mood problems he does not know the name of the medication. He would like it called in to the [facility] on [address]

## 2017-03-12 NOTE — Telephone Encounter (Signed)
Nuedexta.    Will send Rx, just give me the info :)

## 2017-03-13 NOTE — Telephone Encounter (Signed)
Spoke to pt's husband, Gwyndolyn Saxon, relaying message below. He states that [facility] contacted him within minutes of Dr. Delice Lesch send ing Rx to let him know that they are out of Spiro but will have shipment come in today around 3 pm.

## 2017-03-14 ENCOUNTER — Telehealth: Payer: Self-pay | Admitting: Neurology

## 2017-03-14 DIAGNOSIS — R531 Weakness: Secondary | ICD-10-CM | POA: Diagnosis not present

## 2017-03-14 NOTE — Telephone Encounter (Signed)
PT's spouse has a question regarding the medication dosage, should it be once a day or twice a day, did not say what the name was

## 2017-03-15 NOTE — Telephone Encounter (Signed)
LMOM with Nuedexta instructions - 1 capsule BID

## 2017-03-15 NOTE — Telephone Encounter (Signed)
Pt's husband returned my call.  He states that Dr. Delice Lesch told him to only give pt 1 capsule in the morning but when he picked up the Rx it stated 1 cap BID.  I let him know that with the samples given in the office, we were most likely trying to see how the pt reacted to the medication but that the general instructions are BID.  Husband states that he will give the Rx accordingly.  He also states that pt seems to be doing good on the Neudexta.  Her mood swings have "calmed down".

## 2017-03-23 DIAGNOSIS — M79672 Pain in left foot: Secondary | ICD-10-CM | POA: Diagnosis not present

## 2017-04-05 ENCOUNTER — Other Ambulatory Visit: Payer: Self-pay | Admitting: Physician Assistant
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
## 2017-05-01 ENCOUNTER — Ambulatory Visit: Payer: Self-pay | Admitting: Physician Assistant

## 2017-05-03 DIAGNOSIS — H179 Unspecified corneal scar and opacity: Secondary | ICD-10-CM | POA: Diagnosis not present

## 2017-05-03 DIAGNOSIS — W2212XA Striking against or struck by front passenger side automobile airbag, initial encounter: Secondary | ICD-10-CM | POA: Diagnosis not present

## 2017-05-03 DIAGNOSIS — H43822 Vitreomacular adhesion, left eye: Secondary | ICD-10-CM | POA: Diagnosis not present

## 2017-05-03 DIAGNOSIS — H3322 Serous retinal detachment, left eye: Secondary | ICD-10-CM | POA: Diagnosis not present

## 2017-05-07 ENCOUNTER — Other Ambulatory Visit: Payer: Self-pay

## 2017-05-07 ENCOUNTER — Ambulatory Visit (INDEPENDENT_AMBULATORY_CARE_PROVIDER_SITE_OTHER): Payer: Medicare Other | Admitting: Physician Assistant

## 2017-05-07 ENCOUNTER — Encounter: Payer: Self-pay | Admitting: Physician Assistant

## 2017-05-07 VITALS — BP 118/70 | HR 75 | Temp 97.5°F | Resp 18 | Ht 63.0 in | Wt 115.6 lb

## 2017-05-07 DIAGNOSIS — G3185 Corticobasal degeneration: Secondary | ICD-10-CM

## 2017-05-07 DIAGNOSIS — F0280 Dementia in other diseases classified elsewhere without behavioral disturbance: Secondary | ICD-10-CM | POA: Diagnosis not present

## 2017-05-07 DIAGNOSIS — I10 Essential (primary) hypertension: Secondary | ICD-10-CM | POA: Diagnosis not present

## 2017-05-07 DIAGNOSIS — Z79899 Other long term (current) drug therapy: Secondary | ICD-10-CM

## 2017-05-07 DIAGNOSIS — E559 Vitamin D deficiency, unspecified: Secondary | ICD-10-CM | POA: Diagnosis not present

## 2017-05-07 DIAGNOSIS — R569 Unspecified convulsions: Secondary | ICD-10-CM | POA: Diagnosis not present

## 2017-05-07 DIAGNOSIS — G301 Alzheimer's disease with late onset: Secondary | ICD-10-CM | POA: Diagnosis not present

## 2017-05-07 DIAGNOSIS — E782 Mixed hyperlipidemia: Secondary | ICD-10-CM | POA: Diagnosis not present

## 2017-05-07 DIAGNOSIS — G40309 Generalized idiopathic epilepsy and epileptic syndromes, not intractable, without status epilepticus: Secondary | ICD-10-CM

## 2017-05-07 MED ORDER — VALPROATE SODIUM 250 MG/5ML PO SOLN: ORAL | 6 refills | Status: DC

## 2017-05-07 MED ORDER — VITAMIN D (ERGOCALCIFEROL) 1.25 MG (50000 UNIT) PO CAPS: ORAL_CAPSULE | ORAL | 1 refills | Status: DC
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
## 2017-05-07 NOTE — Patient Instructions (Addendum)
Stay on B12 Can do vitamin D 5000 once a day   Can start seeing every 6 months

## 2017-05-08 LAB — HEPATIC FUNCTION PANEL
AG Ratio: 1.3 (calc) (ref 1.0–2.5)
ALT: 11 U/L (ref 6–29)
AST: 17 U/L (ref 10–35)
Albumin: 3.7 g/dL (ref 3.6–5.1)
Alkaline phosphatase (APISO): 58 U/L (ref 33–130)
Bilirubin, Direct: 0.1 mg/dL (ref 0.0–0.2)
GLOBULIN: 2.8 g/dL (ref 1.9–3.7)
Indirect Bilirubin: 0.3 mg/dL (calc) (ref 0.2–1.2)
TOTAL PROTEIN: 6.5 g/dL (ref 6.1–8.1)
Total Bilirubin: 0.4 mg/dL (ref 0.2–1.2)

## 2017-05-08 LAB — CBC WITH DIFFERENTIAL/PLATELET
BASOS PCT: 0.6 %
Basophils Absolute: 43 cells/uL (ref 0–200)
EOS PCT: 1 %
Eosinophils Absolute: 71 cells/uL (ref 15–500)
HCT: 37.9 % (ref 35.0–45.0)
Hemoglobin: 12.6 g/dL (ref 11.7–15.5)
Lymphs Abs: 1463 cells/uL (ref 850–3900)
MCH: 29.8 pg (ref 27.0–33.0)
MCHC: 33.2 g/dL (ref 32.0–36.0)
MCV: 89.6 fL (ref 80.0–100.0)
MONOS PCT: 7.6 %
MPV: 11 fL (ref 7.5–12.5)
Neutro Abs: 4984 cells/uL (ref 1500–7800)
Neutrophils Relative %: 70.2 %
PLATELETS: 227 10*3/uL (ref 140–400)
RBC: 4.23 10*6/uL (ref 3.80–5.10)
RDW: 12.3 % (ref 11.0–15.0)
TOTAL LYMPHOCYTE: 20.6 %
WBC mixed population: 540 cells/uL (ref 200–950)
WBC: 7.1 10*3/uL (ref 3.8–10.8)

## 2017-05-08 LAB — BASIC METABOLIC PANEL WITH GFR
BUN: 22 mg/dL (ref 7–25)
CO2: 27 mmol/L (ref 20–32)
CREATININE: 0.99 mg/dL (ref 0.50–0.99)
Calcium: 8.9 mg/dL (ref 8.6–10.4)
Chloride: 106 mmol/L (ref 98–110)
GFR, Est African American: 71 mL/min/{1.73_m2} (ref >=60)
GFR, Est Non African American: 61 mL/min/{1.73_m2} (ref >=60)
Glucose, Bld: 113 mg/dL — ABNORMAL HIGH (ref 65–99)
Potassium: 4.3 mmol/L (ref 3.5–5.3)
Sodium: 143 mmol/L (ref 135–146)

## 2017-05-08 LAB — TSH: TSH: 0.57 m[IU]/L (ref 0.40–4.50)

## 2017-05-08 LAB — VITAMIN D 25 HYDROXY (VIT D DEFICIENCY, FRACTURES): Vit D, 25-Hydroxy: 74 ng/mL (ref 30–100)

## 2017-05-09 NOTE — Progress Notes (Signed)
Pt aware of lab results & voiced understanding of those results.

## 2017-08-07 ENCOUNTER — Ambulatory Visit (INDEPENDENT_AMBULATORY_CARE_PROVIDER_SITE_OTHER): Payer: Medicare Other | Admitting: Internal Medicine

## 2017-08-07 ENCOUNTER — Encounter: Payer: Self-pay | Admitting: Internal Medicine

## 2017-08-07 VITALS — BP 118/70 | HR 76 | Temp 97.6°F | Resp 17 | Ht 63.0 in | Wt 118.6 lb

## 2017-08-07 DIAGNOSIS — E559 Vitamin D deficiency, unspecified: Secondary | ICD-10-CM

## 2017-08-07 DIAGNOSIS — R7303 Prediabetes: Secondary | ICD-10-CM | POA: Diagnosis not present

## 2017-08-07 DIAGNOSIS — E782 Mixed hyperlipidemia: Secondary | ICD-10-CM

## 2017-08-07 DIAGNOSIS — G3185 Corticobasal degeneration: Secondary | ICD-10-CM

## 2017-08-07 DIAGNOSIS — R569 Unspecified convulsions: Secondary | ICD-10-CM | POA: Diagnosis not present

## 2017-08-07 DIAGNOSIS — R7309 Other abnormal glucose: Secondary | ICD-10-CM

## 2017-08-07 DIAGNOSIS — Z79899 Other long term (current) drug therapy: Secondary | ICD-10-CM | POA: Diagnosis not present

## 2017-08-07 DIAGNOSIS — F0280 Dementia in other diseases classified elsewhere without behavioral disturbance: Secondary | ICD-10-CM | POA: Diagnosis not present

## 2017-08-07 DIAGNOSIS — I10 Essential (primary) hypertension: Secondary | ICD-10-CM | POA: Diagnosis not present

## 2017-08-07 NOTE — Progress Notes (Signed)
This very nice 62 y.o. MWF presents for 3 month follow up with Hypertension, Hyperlipidemia, Pre-Diabetes and Vitamin D Deficiency.     Other problems include progressive severe Dementia designated "Corticobasal Degeneration" by Dr Delice Lesch. Patient sadly appears the Epitome of human despair with loss of all personal mental faculties. She requires 24 hour supervision and 100% assistance in all personal ADL's and and is fed, bathed, dressed and cared for by her husband who I can only describe as an angel in human form.      Patient is treated for HTN & BP has been controlled at home. Today's BP is at goal -  118/70. Patient has had no reported chest pain, dyspnea, dizziness or dependent edema noted by her spouse.     Hyperlipidemia has not controlled with diet nor aggressively treated due to her downhill course.  Last Lipids were not at goal: Lab Results  Component Value Date   CHOL 233 (H) 01/17/2017   HDL 72 01/17/2017   LDLCALC 144 (H) 01/17/2017   TRIG 85 01/17/2017   CHOLHDL 3.2 01/17/2017      Also, the patient has history of  PreDiabetes A1c 6.0% / 2010) and seeming has resolved with weight loss as her condition has deteriorated.  Last A1c was Normal & at goal. Lab Results  Component Value Date   HGBA1C 5.5 01/17/2017      Further, the patient also has history of Vitamin D Deficiency ("15"/2008)  and supplements vitamin D without any suspected side-effects. Last vitamin D was at goal:  Lab Results  Component Value Date   VD25OH 74 05/07/2017   Current Outpatient Medications on File Prior to Visit  Medication Sig  . TYLENOL 8 HOUR) Take by mouth.  Marland Kitchen atenolol  100 MG  TAKE 1 TAB EVERY DAY   . polyethylene glycol  17 g Take  daily.  Marland Kitchen  [id number])176 MG/5ML SOLN Take 3 mL twice a day  . Vitamin D 50000 units CAPS  TAKE 1 CAPSULE DAILY   Allergies  Allergen Reactions  . Amitriptyline Other (See Comments)    dysphoria  . Carafate [Sucralfate] Other (See Comments)   constipation  . Amitriptyline Other (See Comments)    dysphoria  . Carafate [Sucralfate] Other (See Comments)    constipation  . Codeine Other (See Comments)    Insomnia; agitation; restlessness   . Lexapro [Escitalopram Oxalate] Other (See Comments)    Insomnia; agitation; restlessness   . Paxil [Paroxetine Hcl] Other (See Comments)    Insomnia; agitation; restlessness   . Prednisone Other (See Comments)    Unknown reaction  . Promethazine Nausea Only  . Zoloft [Sertraline Hcl] Other (See Comments)    Insomnia; agitation; restlessness   . Codeine Other (See Comments)    Insomnia; agitation; restlessness   . Lexapro [Escitalopram Oxalate] Other (See Comments)    Insomnia; agitation; restlessness   . Paxil [Paroxetine Hcl] Other (See Comments)    Insomnia; agitation; restlessness   . Prednisone Other (See Comments)    unknown  . Promethazine Hcl Nausea Only  . Zoloft [Sertraline Hcl] Other (See Comments)    Insomnia; agitation; restlessness    PMHx:   Past Medical History:  Diagnosis Date  . Anemia    Iron def   . Bacterial overgrowth syndrome   . Chronic headaches   . Dementia   . Depression   . Diverticulosis of colon (without mention of hemorrhage)   . Family history of malignant neoplasm of  gastrointestinal tract   . GERD (gastroesophageal reflux disease)   . Headache(784.0)   . Hypercholesterolemia   . Hypertension   . IBS (irritable bowel syndrome)   . Osteoarthritis   . Pancreatitis   . Unspecified gastritis and gastroduodenitis without mention of hemorrhage   . Vitamin B12 deficiency    Immunization History  Administered Date(s) Administered  . PPD Test 04/16/2014  . Pneumococcal Polysaccharide-23 04/03/2012  . Tdap 04/03/2012, 02/05/2017   Past Surgical History:  Procedure Laterality Date  . ABDOMINAL HYSTERECTOMY    . APPENDECTOMY    . CESAREAN SECTION     x 4  . CESAREAN SECTION     x4  . PARS PLANA VITRECTOMY Right 04/02/2014   Procedure:  RIGHT PARS PLANA VITRECTOMY WITH 25 GAUGE/ENDO LASER/LENSECTOMY/INSERTION OF SILICONE OIL,REPAIR COMPLEX RETINAL DETACHMENT,MEMBRANE PEEL, IRIDECTOMY;  Surgeon: Hurman Horn, MD;  Location: [facility];  Service: Ophthalmology;  Laterality: Right;   FHx:    Reviewed / unchanged  SHx:    Reviewed / unchanged  Systems Review:   Symptom review is impossible to to her severe dementia, but husband responds negative to 10 systems review.   Physical Exam  BP 118/70   Pulse 76   Temp 97.6 F (36.4 C)   Resp 17   Ht 5\' 3"  (1.6 m)   Wt 118 lb 9.6 oz (53.8 kg)   SpO2 96%   BMI 21.01 kg/m   Appears malnourished, well groomed  and in somewhat agitated up and about exploring the room with a shuffling gait.  Eyes: PERRLA, EOMs, conjunctiva no swelling or erythema. Sinuses: No frontal/maxillary tenderness ENT/Mouth: EAC's clear, TM's nl w/o erythema, bulging. Nares clear w/o erythema, swelling, exudates. Oropharynx clear without erythema or exudates. Oral hygiene is good. Tongue normal, non obstructing. Hearing intact.  Neck: Supple. Thyroid nl. Car 2+/2+ without bruits, nodes or JVD. Chest: Respirations nl with BS clear & equal w/o rales, rhonchi, wheezing or stridor.  Cor: Heart sounds normal w/ regular rate and rhythm without sig. murmurs, gallops, clicks or rubs. Peripheral pulses normal and equal  without edema.  Abdomen: Soft & bowel sounds normal. Non-tender w/o guarding, rebound, hernias, masses or organomegaly.  Lymphatics: Unremarkable.  Musculoskeletal:  Generalized muscle wasting with decreased power and bulk, but with increased tone with mild asymmetric cog-wheeling or occasional twitching type myoclonus. No obvious tremor. Stooped posture with gait broad based & shuffling.  Skin: Warm, dry without exposed rashes, lesions or ecchymosis apparent.  Neuro: Cranial nerves intact, reflexes equal bilaterally. Sensory-motor testing grossly intact. Tendon reflexes grossly hyperreactive. (+) Snout and  Palmomental responses.  Pysch: Cannot follow simple commands.   Assessment and Plan:  1. Essential hypertension  2. Hyperlipidemia, mixed  3. Prediabetes  4. Vitamin D deficiency  5. Abnormal glucose  6. Dementia, corticobasal degeneration   7. Seizure ([place])  8. Medication management       Labs were unable to be obtained today consequent of her agitation and lack of cooperation.  Over 45 minutes of exam, counseling of husband chart review was performed. Apparently Hospice declined involvement until such time as he become bedridden or unable to eat.

## 2017-08-07 NOTE — Patient Instructions (Signed)

## 2017-09-03 ENCOUNTER — Other Ambulatory Visit: Payer: Self-pay

## 2017-09-03 ENCOUNTER — Ambulatory Visit (INDEPENDENT_AMBULATORY_CARE_PROVIDER_SITE_OTHER): Payer: Medicare Other | Admitting: Neurology

## 2017-09-03 DIAGNOSIS — G40309 Generalized idiopathic epilepsy and epileptic syndromes, not intractable, without status epilepticus: Secondary | ICD-10-CM

## 2017-09-03 DIAGNOSIS — G3185 Corticobasal degeneration: Secondary | ICD-10-CM | POA: Diagnosis not present

## 2017-09-03 MED ORDER — VALPROATE SODIUM 250 MG/5ML PO SOLN: ORAL | 11 refills | Status: DC

## 2017-09-03 NOTE — Patient Instructions (Signed)
1. Continue Depakote 250mg /36mL, take 73mL twice a day 2. Follow-up in 1 year, call for any changes

## 2017-09-03 NOTE — Progress Notes (Signed)
NEUROLOGY FOLLOW UP OFFICE NOTE  Helen BRADWAY [id number]  DOB: [birth year]  HISTORY OF PRESENT ILLNESS: I had the pleasure of seeing Helen Cabrera in follow-up in the neurology clinic on 09/03/2017.  The patient was last seen 6 months ago. She had new onset seizures in the setting of a neurodegenerative condition (?corticobasal degeneration). She is again accompanied by her husband who provides the history as she is mostly non-verbal and does not follow commands. Since her last visit, her husband denies any seizures. She continues on Depakote 248m/5mL, taking 329mBID with no side effects. She continues to have 24/7 care. He reports that there has not been much change since her last visit. She was walking around aimlessly in the room suddenly bursting out crying on her last visit (similar to today), and was given samples for Nuedexta. Her husband states this did not help. She is again ambulating aimlessly in the room, intermittently crying. She would say "hi" and "bye" when instructed by her husband. She does not follow other commands. She continues to have left arm myoclonic jerks. He reports she has had a few falls. He reports her appetite is good, she can feed herself with her hands, but cannot use utensils. She is total care with ADLs otherwise. Her husband reports sleep is good, he gives her liquid Tylenol every night.   HPI 13/31/2017: [name] with a history of early-onset dementia, hypertension, hyperlipidemia, who presented for new onset seizures. Her husband reports 2 seizures, one in July, and another in August, but on review of records, it appears the [facility]. [place] were at [place] courthouse when he saw her fall to the ground with whole body jerking, eyes rolled back, gasping breathing for 10-15 minutes. [facility] which I personally reviewed, no acute changes seen. There was mild diffuse atrophy with ventriculomegaly. Routine EEG showed mild posterior background slowing. She was discharged home with no seizure medication since this was the first event. She had another episode which her husband [facility] but no records seen. PCP note in September indicated her husband reported that they [place] the [place] and sitting in the backseat when she suddenly gasped and fell on his shoulder, unresponsive with a little jerking, less intense than the first event. [facility] was discontinued and Keppra was started. She has been taking Keppra 50023mID for the past month or so with no further similar episodes, however her husband is concerned that she is getting more aggravated with her family. Prior to starting Keppra, she had some behavioral issues with paranoia, but her husband reports she is now getting physical with her family, even with her husband. She tells him she is going to leave or tells people to get out of the house. She has a hard time staying still, always wanting to walk around, then getting aggravated when redirected. Her sense of direction is now "way off." He ran out of Keppra 10 days ago and called his PCP, but tells me that she has been taking ?Cephalexin?. He did not bring the bottle and will confirm with us.[place] No prior history of seizures. As far as her husband knows, she had a normal birth and  early development.  There is no history of febrile convulsions, CNS infections such as meningitis/encephalitis, significant traumatic brain injury, neurosurgical procedures, or family history of seizures. There is a strong family history of Alzheimer's disease in her mother (diagnosed before age 60), maternal aunt and uncle. She had been working for [employer] for 40 years but started making  mistakes around age 62 or 63. She initially saw neurologist Dr. Yan, records unavailable for review, then saw Dr. Malone at [facility] in 2013. At that time, notes indicate she had a head CT showing a 1cm right tentorial meningioma confirmed by MRI. She had normal B12, ESR, RPR, ANA, TSH, Sjogren's antibodies, and SPEP. She had neuropsychological evaluation which suggested that depression and stress were likely playing a role. It was felt that symptoms likely represent early Alzheimer's dementia worsened by underlying psychiatric issues. MMSE in 01/2013 was 24/30. She was started on Aricept and Namenda, which caused side effects. She has been on the Exelon patch for at least 2 years. She is dependent on her husband for bathing, dressing. She can feed herself but needs supervision. They have started using Depends because she was wetting the bed at night, but is able to go to the bathroom during the daytime. She has hallucinations of people in the room only at night. She wakes up around 3am, agitated for an hour then goes back to sleep. Her husband has noticed that when she wakes up she cannot communicate as well, stammering and unable to finish her sentences, then once more awake, she is not as bad. She is noted to have myoclonic jerking of the left > right UE, left leg, which her husband reports started while she was still working at age 54, she would drop a drink from her hand. This has progressively worsened where sometimes she has a hand tremor.  PAST MEDICAL HISTORY: Past Medical History:  Diagnosis Date  . Anemia    Iron def   . Bacterial overgrowth syndrome   . Chronic headaches   . Dementia   . Depression   . Diverticulosis of colon (without mention of hemorrhage)   . Family history of malignant neoplasm of gastrointestinal tract   . GERD (gastroesophageal reflux disease)   . Headache(784.0)   . Hypercholesterolemia   . Hypertension   . IBS (irritable bowel syndrome)   . Osteoarthritis   .  Pancreatitis   . Unspecified gastritis and gastroduodenitis without mention of hemorrhage   . Vitamin B12 deficiency     MEDICATIONS:   Outpatient Encounter Medications as of 09/03/2017  Medication Sig Note  . Acetaminophen (TYLENOL 8 HOUR PO) Take by mouth. 02/21/2017: Gives 4ml at night to help with sleep  . atenolol (TENORMIN) 100 MG tablet TAKE 1 TABLET EVERY DAY FOR BLOOD PRESSURE 05/07/2017: Takes half tablet  . OVER THE COUNTER MEDICATION  02/21/2017: Vitamin B reported by pt's husband  . polyethylene glycol (MIRALAX / GLYCOLAX) packet Take 17 g by mouth daily. 02/21/2017: As needed  . Valproate Sodium (DEPAKENE) 250 MG/5ML SOLN solution Take 3 mL twice a day   . Vitamin D, Ergocalciferol, (DRISDOL) 50000 units CAPS capsule TAKE 1 CAPSULE DAILY OR AS DIRECTED FOR SEVERE VIT D DEFICIENCY    No facility-administered encounter medications on file as of 09/03/2017.    ALLERGIES: Allergies  Allergen Reactions  . Amitriptyline Other (See Comments)    dysphoria  . Carafate [Sucralfate] Other (See Comments)    constipation  . Amitriptyline Other (See Comments)    dysphoria  .   Carafate [Sucralfate] Other (See Comments)    constipation  . Codeine Other (See Comments)    Insomnia; agitation; restlessness   . Lexapro [Escitalopram Oxalate] Other (See Comments)    Insomnia; agitation; restlessness   . Paxil [Paroxetine Hcl] Other (See Comments)    Insomnia; agitation; restlessness   . Prednisone Other (See Comments)    Unknown reaction  . Promethazine Nausea Only  . Zoloft [Sertraline Hcl] Other (See Comments)    Insomnia; agitation; restlessness   . Codeine Other (See Comments)    Insomnia; agitation; restlessness   . Lexapro [Escitalopram Oxalate] Other (See Comments)    Insomnia; agitation; restlessness   . Paxil [Paroxetine Hcl] Other (See Comments)    Insomnia; agitation; restlessness   . Prednisone Other (See Comments)    unknown  . Promethazine Hcl Nausea Only  . Zoloft  [Sertraline Hcl] Other (See Comments)    Insomnia; agitation; restlessness     FAMILY HISTORY: Family History  Problem Relation Age of Onset  . Diabetes Mother   . Alcohol abuse Father   . Cirrhosis Father   . Breast cancer Maternal Aunt   . Colon cancer Maternal Aunt   . Stomach cancer Brother   . Esophageal cancer Brother   . Diabetes Brother   . Diabetes Maternal Uncle   . Heart disease Unknown        grandfather/grandmother    SOCIAL HISTORY: Social History   Socioeconomic History  . Marital status: Married    Spouse name: Not on file  . Number of children: 4  . Years of education: Not on file  . Highest education level: Not on file  Social Needs  . Financial resource strain: Not on file  . Food insecurity - worry: Not on file  . Food insecurity - inability: Not on file  . Transportation needs - medical: Not on file  . Transportation needs - non-medical: Not on file  Occupational History  . Occupation: CUSTOMER SERVICE    Employer: [employer]  Tobacco Use  . Smoking status: Never Smoker  . Smokeless tobacco: Never Used  Substance and Sexual Activity  . Alcohol use: Yes    Alcohol/week: 0.0 oz    Comment: occ- very rarely  . Drug use: No  . Sexual activity: Not on file  Other Topics Concern  . Not on file  Social History Narrative   ** Merged History Encounter **        REVIEW OF SYSTEMS unable to obtain due to mental status  PHYSICAL EXAM: There were no vitals filed for this visit. (unable to obtain, patient became agitated when touched by staff) General: She is again pacing around the room, comes up to me as I walk in, then starts crying. Says "hi" and "bye" with mild dysarthria, does not follow any commands. She is again noted to have myoclonic jerks of the left hand and left side of face. Head:  Normocephalic/atraumatic Eyes: Right eye opaque Neck: supple, no paraspinal tenderness, full range of motion Back: No paraspinal tenderness Heart: regular  rate and rhythm Lungs: Clear to auscultation bilaterally. Vascular: No carotid bruits. Skin/Extremities: No rash, no edema Neurological Exam: Mental status: awake and alert, no verbal output today, does not follow commands Cranial nerves: CN I: not tested CN II: right eye opaque, no light perception. Left pupil round and reactive to light, visual fields intact but again appears to have some left-sided neglect CN III, IV, VI:  full range of motion, no nystagmus, no ptosis  CN VII: upper and lower face symmetric Bulk & Tone: increased with +cogwheeling bilaterally, no fasciculations. Motor: 5/5 on right, 4/5 on left with left arm kept at flexion (similar to prior) Sensation: unable to test due to mental status Gait: slow and cautious, hunched, reduced arm swing bilaterally, keeping left arm flexed (similar to prior) Tremor: none noted today  IMPRESSION: This is a 61 yo RH woman with a history of early-onset dementia, hypertension, hyperlipidemia, with new onset seizures. In the early stages of her dementia, she was diagnosed with early onset Alzheimer's disease, however presentation over her follow-up visits showed asymmetric exam, left hemineglect, myoclonus, cogwheeling, and report of tremor, suggestive of corticobasal degeneration. Seizures are not typically seen with this condition, however patients with dementia do have a higher frequency of seizures. The two episodes are suggestive of generalized seizures, her routine EEG reported diffuse slowing, MRI brain shows diffuse atrophy. She is taking Depakote 250mg/5mL 3mL BID without side effects, continue current dose. We discussed that if behavioral issues increase, we could try increasing Depakote. His husband would like to hold off for now. She has 24/7 care, continue supportive care. She will follow-up in 1 year, husband knows to call for any changes.   Thank you for allowing me to participate in her care.  Please do not hesitate to call for  any questions or concerns.  The duration of this appointment visit was 15 minutes of face-to-face time with the patient.  Greater than 50% of this time was spent in counseling, explanation of diagnosis, planning of further management, and coordination of care.   Karen Aquino, M.D.   CC: Dr. McKeown      

## 2017-09-04 ENCOUNTER — Encounter: Payer: Self-pay | Admitting: Neurology

## 2017-09-12 ENCOUNTER — Encounter: Payer: Self-pay | Admitting: Internal Medicine

## 2017-10-03 ENCOUNTER — Other Ambulatory Visit: Payer: Self-pay | Admitting: *Deleted

## 2017-10-03 MED ORDER — ATENOLOL 100 MG PO TABS: ORAL_TABLET | ORAL | 1 refills | Status: AC

## 2017-10-13 ENCOUNTER — Emergency Department (HOSPITAL_COMMUNITY): Payer: Medicare Other

## 2017-10-13 ENCOUNTER — Encounter (HOSPITAL_COMMUNITY): Payer: Self-pay

## 2017-10-13 ENCOUNTER — Emergency Department (HOSPITAL_COMMUNITY)
Admission: EM | Admit: 2017-10-13 | Discharge: 2017-10-13 | Disposition: A | Discharge status: Home / Self Care | Payer: Medicare Other | Attending: Emergency Medicine | Admitting: Emergency Medicine

## 2017-10-13 DIAGNOSIS — F0390 Unspecified dementia without behavioral disturbance: Secondary | ICD-10-CM | POA: Diagnosis not present

## 2017-10-13 DIAGNOSIS — G3185 Corticobasal degeneration: Secondary | ICD-10-CM | POA: Diagnosis not present

## 2017-10-13 DIAGNOSIS — I10 Essential (primary) hypertension: Secondary | ICD-10-CM | POA: Diagnosis not present

## 2017-10-13 DIAGNOSIS — G40309 Generalized idiopathic epilepsy and epileptic syndromes, not intractable, without status epilepticus: Secondary | ICD-10-CM | POA: Insufficient documentation

## 2017-10-13 DIAGNOSIS — Z79899 Other long term (current) drug therapy: Secondary | ICD-10-CM | POA: Insufficient documentation

## 2017-10-13 DIAGNOSIS — G40909 Epilepsy, unspecified, not intractable, without status epilepticus: Secondary | ICD-10-CM | POA: Diagnosis not present

## 2017-10-13 DIAGNOSIS — S3993XA Unspecified injury of pelvis, initial encounter: Secondary | ICD-10-CM | POA: Diagnosis not present

## 2017-10-13 DIAGNOSIS — R569 Unspecified convulsions: Secondary | ICD-10-CM | POA: Diagnosis not present

## 2017-10-13 LAB — COMPREHENSIVE METABOLIC PANEL WITH GFR
ALT: 10 U/L — ABNORMAL LOW (ref 14–54)
AST: 22 U/L (ref 15–41)
Albumin: 3.7 g/dL (ref 3.5–5.0)
Alkaline Phosphatase: 61 U/L (ref 38–126)
Anion gap: 11 (ref 5–15)
BUN: 22 mg/dL — ABNORMAL HIGH (ref 6–20)
CO2: 26 mmol/L (ref 22–32)
Calcium: 9 mg/dL (ref 8.9–10.3)
Chloride: 102 mmol/L (ref 101–111)
Creatinine, Ser: 0.88 mg/dL (ref 0.44–1.00)
GFR calc Af Amer: >60 mL/min
GFR calc non Af Amer: >60 mL/min
Glucose, Bld: 133 mg/dL — ABNORMAL HIGH (ref 65–99)
Potassium: 4 mmol/L (ref 3.5–5.1)
Sodium: 139 mmol/L (ref 135–145)
Total Bilirubin: 0.5 mg/dL (ref 0.3–1.2)
Total Protein: 6.6 g/dL (ref 6.5–8.1)

## 2017-10-13 LAB — URINALYSIS, ROUTINE W REFLEX MICROSCOPIC
BACTERIA UA: NONE SEEN
BILIRUBIN URINE: NEGATIVE
Glucose, UA: NEGATIVE mg/dL
KETONES UR: NEGATIVE mg/dL
NITRITE: NEGATIVE
Protein, ur: NEGATIVE mg/dL
SPECIFIC GRAVITY, URINE: 1.008 (ref 1.005–1.030)
pH: 9 — ABNORMAL HIGH (ref 5.0–8.0)

## 2017-10-13 LAB — CBC WITH DIFFERENTIAL/PLATELET
Basophils Absolute: 0 10*3/uL (ref 0.0–0.1)
Basophils Relative: 1 %
Eosinophils Absolute: 0.1 10*3/uL (ref 0.0–0.7)
Eosinophils Relative: 1 %
HCT: 39.3 % (ref 36.0–46.0)
Hemoglobin: 12.6 g/dL (ref 12.0–15.0)
Lymphocytes Relative: 35 %
Lymphs Abs: 2.7 10*3/uL (ref 0.7–4.0)
MCH: 29.9 pg (ref 26.0–34.0)
MCHC: 32.1 g/dL (ref 30.0–36.0)
MCV: 93.1 fL (ref 78.0–100.0)
Monocytes Absolute: 0.5 10*3/uL (ref 0.1–1.0)
Monocytes Relative: 7 %
Neutro Abs: 4.5 10*3/uL (ref 1.7–7.7)
Neutrophils Relative %: 56 %
Platelets: 264 10*3/uL (ref 150–400)
RBC: 4.22 MIL/uL (ref 3.87–5.11)
RDW: 13.2 % (ref 11.5–15.5)
WBC: 7.8 10*3/uL (ref 4.0–10.5)

## 2017-10-13 LAB — I-STAT CHEM 8, ED
BUN: 21 mg/dL — AB (ref 6–20)
CHLORIDE: 103 mmol/L (ref 101–111)
CREATININE: 0.8 mg/dL (ref 0.44–1.00)
Calcium, Ion: 1.11 mmol/L — ABNORMAL LOW (ref 1.15–1.40)
Glucose, Bld: 107 mg/dL — ABNORMAL HIGH (ref 65–99)
HCT: 36 % (ref 36.0–46.0)
Hemoglobin: 12.2 g/dL (ref 12.0–15.0)
Potassium: 3.9 mmol/L (ref 3.5–5.1)
Sodium: 140 mmol/L (ref 135–145)
TCO2: 28 mmol/L (ref 22–32)

## 2017-10-13 LAB — VALPROIC ACID LEVEL: Valproic Acid Lvl: 36 ug/mL — ABNORMAL LOW (ref 50.0–100.0)

## 2017-10-13 MED ORDER — LORAZEPAM 2 MG/ML IJ SOLN
1.0000 mg | Freq: Once | INTRAMUSCULAR | Status: AC
  Administered 2017-10-13: 1 mg via INTRAVENOUS
  Filled 2017-10-13: qty 1

## 2017-10-13 MED ORDER — SODIUM CHLORIDE 0.9 % IV SOLN
1.0000 g | Freq: Once | INTRAVENOUS | Status: AC
  Administered 2017-10-13: 1 g via INTRAVENOUS
  Filled 2017-10-13: qty 10

## 2017-10-13 MED ORDER — SODIUM CHLORIDE 0.9 % IV BOLUS (SEPSIS)
500.0000 mL | Freq: Once | INTRAVENOUS | Status: AC
  Administered 2017-10-13: 500 mL via INTRAVENOUS

## 2017-10-13 MED ORDER — VALPROATE SODIUM 250 MG/5ML PO SOLN: 1000.0000 mg | Freq: Two times a day (BID) | ORAL | 11 refills | Status: DC

## 2017-10-13 MED ORDER — CEPHALEXIN 250 MG/5ML PO SUSR: 500.0000 mg | Freq: Two times a day (BID) | ORAL | 0 refills | Status: AC

## 2017-10-13 MED ORDER — VALPROATE SODIUM 500 MG/5ML IV SOLN
750.0000 mg | Freq: Once | INTRAVENOUS | Status: AC
  Administered 2017-10-13: 750 mg via INTRAVENOUS
  Filled 2017-10-13: qty 7.5

## 2017-10-13 NOTE — ED Notes (Addendum)
Wasted 1 mg Ativan with [place], charge nurse d/t name no longer in pyxis.

## 2017-10-13 NOTE — ED Triage Notes (Signed)
To room via EMS.  Pt had witnessed seizure, pt was in standing position, sat down and then fell backwards, whole body tensed up with some shaking.  Lasted <1 min.  Pt has h/o dementia is non verbal, cries out.

## 2017-10-13 NOTE — Discharge Instructions (Signed)
Please follow with your primary care doctor in the next 2 days for a check-up. They must obtain records for further management.  ° °Do not hesitate to return to the Emergency Department for any new, worsening or concerning symptoms.  ° °

## 2017-10-13 NOTE — ED Provider Notes (Signed)
[facility] EMERGENCY DEPARTMENT Provider Note   CSN: [id number] Arrival date & time: 10/13/17  1127     History   Chief Complaint Chief Complaint  Patient presents with  . Seizures    HPI   Blood pressure (!) 149/89, pulse 90, temperature 99.3 F (37.4 C), temperature source Axillary, resp. rate 16, SpO2 97 %.  Helen Cabrera is a 62 y.o. female with past medical history significant for neurodegenerative condition (?corticobasal degeneration)., cared for by her husband at home.  Her baseline she is nonverbal, can walk and needs assistance to feed herself, incontinent of urine. she has a history of seizure disorder, compliant with her Depakote  (750 BID which is administered by her husband.  She has been in her normal state of health, she had a mild upper respiratory infection several weeks ago, son had sinusitis.  Eating and drinking normally.  She had seizure first onset 2 years ago, last seizure was 1 year ago.  Level 5 caveat secondary to altered mental status.  Per her husband he was doing laundry he looked down and she was slumped on the ground.  She became rigid and had a [place] grass, she was incontinent to urine.  This is typical of her seizures, and lasted only several minutes.  She is back to her baseline per her husband.  She is followed by Dr. Delice Lesch of neurology.  Past Medical History:  Diagnosis Date  . Anemia    Iron def   . Bacterial overgrowth syndrome   . Chronic headaches   . Dementia   . Depression   . Diverticulosis of colon (without mention of hemorrhage)   . Family history of malignant neoplasm of gastrointestinal tract   . GERD (gastroesophageal reflux disease)   . Headache(784.0)   . Hypercholesterolemia   . Hypertension   . IBS (irritable bowel syndrome)   . Osteoarthritis   . Pancreatitis   . Unspecified gastritis and gastroduodenitis without mention of hemorrhage   . Vitamin B12 deficiency     Patient Active Problem List   Diagnosis Date Noted  . Corticobasal degeneration 02/27/2017  . Epilepsy, generalized, convulsive ([place]) 02/27/2017  . Left arm weakness 03/02/2016  . History of depression 03/02/2016  . Vitamin D deficiency 10/09/2013  . Medication management 10/09/2013  . Iron deficiency anemia 01/25/2010  . Vitamin B 12 deficiency 10/11/2009  . Hyperlipidemia 10/11/2009  . Essential hypertension 10/11/2009  . GERD 10/11/2009  . Diverticulosis of colon 10/11/2009  . Osteoarthritis 10/11/2009    Past Surgical History:  Procedure Laterality Date  . ABDOMINAL HYSTERECTOMY    . APPENDECTOMY    . CESAREAN SECTION     x 4  . CESAREAN SECTION     x4  . PARS PLANA VITRECTOMY Right 04/02/2014   Procedure: RIGHT PARS PLANA VITRECTOMY WITH 25 GAUGE/ENDO LASER/LENSECTOMY/INSERTION OF SILICONE OIL,REPAIR COMPLEX RETINAL DETACHMENT,MEMBRANE PEEL, IRIDECTOMY;  Surgeon: Hurman Horn, MD;  Location: [facility];  Service: Ophthalmology;  Laterality: Right;     OB History   None      Home Medications    Prior to Admission medications   Medication Sig Start Date End Date Taking? Authorizing Provider  atenolol (TENORMIN) 100 MG tablet TAKE 1 TABLET EVERY DAY FOR BLOOD PRESSURE Patient taking differently: Take 50 mg by mouth at bedtime.  10/03/17  Yes [name], MD  guaiFENesin (MUCINEX) 600 MG 12 hr tablet Take 600 mg by mouth daily as needed.   Yes [provider]  polyethylene glycol (MIRALAX / GLYCOLAX) packet Take 17 g by mouth daily. 05/03/16  Yes Vicie Mutters, PA-C  vitamin B-12 (CYANOCOBALAMIN) 100 MCG tablet Take 100 mcg by mouth daily.   Yes [provider]  Vitamin D, Ergocalciferol, (DRISDOL) 50000 units CAPS capsule TAKE 1 CAPSULE DAILY OR AS DIRECTED FOR SEVERE VIT D DEFICIENCY 05/07/17  Yes Vicie Mutters, PA-C  cephALEXin (KEFLEX) 250 MG/5ML suspension Take 10 mLs (500 mg total) by mouth 2 (two) times daily for 10 days. 10/13/17 10/23/17  Kyndall Amero, Elmyra Ricks, PA-C  Valproate  Sodium (DEPAKENE) 250 MG/5ML SOLN solution Take 20 mLs (1,000 mg total) by mouth 2 (two) times daily. Take 3 mL twice a day 10/13/17   Lexus Shampine, Elmyra Ricks, PA-C    Family History Family History  Problem Relation Age of Onset  . Diabetes Mother   . Alcohol abuse Father   . Cirrhosis Father   . Breast cancer Maternal Aunt   . Colon cancer Maternal Aunt   . Stomach cancer Brother   . Esophageal cancer Brother   . Diabetes Brother   . Diabetes Maternal Uncle   . Heart disease Unknown        grandfather/grandmother    Social History Social History   Tobacco Use  . Smoking status: Never Smoker  . Smokeless tobacco: Never Used  Substance Use Topics  . Alcohol use: Yes    Alcohol/week: 0.0 oz    Comment: occ- very rarely  . Drug use: No     Allergies   Amitriptyline; Carafate [sucralfate]; Amitriptyline; Carafate [sucralfate]; Codeine; Lexapro [escitalopram oxalate]; Paxil [paroxetine hcl]; Prednisone; Promethazine; Zoloft [sertraline hcl]; Codeine; Lexapro [escitalopram oxalate]; Paxil [paroxetine hcl]; Prednisone; Promethazine hcl; and Zoloft [sertraline hcl]   Review of Systems Review of Systems  A complete review of systems was obtained and all systems are negative except as noted in the HPI and PMH.   Physical Exam Updated Vital Signs BP 138/68   Pulse 85   Temp 99.1 F (37.3 C) (Oral)   Resp (!) 22   SpO2 99%   Physical Exam  Constitutional: She appears well-developed and well-nourished. No distress.  HENT:  Head: Normocephalic and atraumatic.  Mouth/Throat: Oropharynx is clear and moist.  Eyes: Pupils are equal, round, and reactive to light. Conjunctivae and EOM are normal.  Neck: Normal range of motion.  Cardiovascular: Normal rate, regular rhythm and intact distal pulses.  Pulmonary/Chest: Effort normal and breath sounds normal.  Abdominal: Soft. There is no tenderness.  Musculoskeletal: Normal range of motion.  Neurological: She is alert.  Screaming  intermittently, nonverbal  Skin: She is not diaphoretic.  Psychiatric:  Intermittent screaming  Nursing note and vitals reviewed.    ED Treatments / Results  Labs (all labs ordered are listed, but only abnormal results are displayed) Labs Reviewed  VALPROIC ACID LEVEL - Abnormal; Notable for the following components:      Result Value   Valproic Acid Lvl 36 (*)    All other components within normal limits  COMPREHENSIVE METABOLIC PANEL - Abnormal; Notable for the following components:   Glucose, Bld 133 (*)    BUN 22 (*)    ALT 10 (*)    All other components within normal limits  URINALYSIS, ROUTINE W REFLEX MICROSCOPIC - Abnormal; Notable for the following components:   Color, Urine STRAW (*)    pH 9.0 (*)    Hgb urine dipstick SMALL (*)    Leukocytes, UA LARGE (*)    Squamous Epithelial / LPF 0-5 (*)  All other components within normal limits  I-STAT CHEM 8, ED - Abnormal; Notable for the following components:   BUN 21 (*)    Glucose, Bld 107 (*)    Calcium, Ion 1.11 (*)    All other components within normal limits  URINE CULTURE  URINE CULTURE  CBC WITH DIFFERENTIAL/PLATELET  CBG MONITORING, ED    EKG None  Radiology Dg Pelvis 1-2 Views  Result Date: 10/13/2017 CLINICAL DATA:  Witnessed seizure from standing position, fell backwards, duration less than 1 minute, history of dementia EXAM: PELVIS - 1-2 VIEW COMPARISON:  Portable exam 1234 hours without priors for comparison FINDINGS: Mild osseous demineralization. Minimal narrowing of the hip joints bilaterally. No acute fracture, dislocation, or bone destruction. IMPRESSION: No acute osseous abnormalities. Electronically Signed   By: Lavonia Dana M.D.   On: 10/13/2017 13:00   Ct Head Wo Contrast  Result Date: 10/13/2017 CLINICAL DATA:  Witnessed seizure. EXAM: CT HEAD WITHOUT CONTRAST TECHNIQUE: Contiguous axial images were obtained from the base of the skull through the vertex without intravenous contrast.  COMPARISON:  July 27, 2016 FINDINGS: Brain: No subdural, epidural, or subarachnoid hemorrhage. The cerebellum, brainstem, and basal cisterns are normal. Ventricles and sulci remain prominent. No mass effect or midline shift. No acute cortical ischemia or infarct. Vascular: No hyperdense vessel or unexpected calcification. Skull: Normal. Negative for fracture or focal lesion. Sinuses/Orbits: No acute finding. Other: High attenuation material in the right globe is stable since January 2018. No acute intracranial abnormalities. IMPRESSION: No cause for the patient's symptoms identified. Electronically Signed   By: Dorise Bullion III M.D   On: 10/13/2017 13:37    Procedures Procedures (including critical care time)  Medications Ordered in ED Medications  valproate (DEPACON) 750 mg in dextrose 5 % 50 mL IVPB (has no administration in time range)  cefTRIAXone (ROCEPHIN) 1 g in sodium chloride 0.9 % 100 mL IVPB (1 g Intravenous New Bag/Given 10/13/17 1608)  LORazepam (ATIVAN) injection 1 mg (1 mg Intravenous Given 10/13/17 1158)  sodium chloride 0.9 % bolus 500 mL (0 mLs Intravenous Stopped 10/13/17 1557)     Initial Impression / Assessment and Plan / ED Course  I have reviewed the triage vital signs and the nursing notes.  Pertinent labs & imaging results that were available during my care of the patient were reviewed by me and considered in my medical decision making (see chart for details).     Vitals:   10/13/17 1345 10/13/17 1400 10/13/17 1430 10/13/17 1500  BP: 130/69 132/76 126/75 138/68  Pulse: 82 81 87 85  Resp: (!) 21 16 14  (!) 22  Temp:      TempSrc:      SpO2: 96% 96% 98% 99%    Medications  valproate (DEPACON) 750 mg in dextrose 5 % 50 mL IVPB (has no administration in time range)  cefTRIAXone (ROCEPHIN) 1 g in sodium chloride 0.9 % 100 mL IVPB (1 g Intravenous New Bag/Given 10/13/17 1608)  LORazepam (ATIVAN) injection 1 mg (1 mg Intravenous Given 10/13/17 1158)  sodium  chloride 0.9 % bolus 500 mL (0 mLs Intravenous Stopped 10/13/17 1557)    Helen Cabrera is 61 y.o. female presenting with seizure onset this morning, compliant with her Depakote.  She is mentating at her baseline as per husband, baseline is complicated by neurodegenerative disorder.  She is nonverbal, she can walk but she wails most of the time.  Depakote level subtherapeutic.  Neuro consult from Dr. Malen Gauze appreciated: He recommends  loading her with 750 mg and increasing the regular dosage to 1000 mg twice daily.  Urine with large leukocytes.  Urine culture pending, will give Rocephin and DC home on Keflex.  Evaluation does not show pathology that would require ongoing emergent intervention or inpatient treatment. Pt is hemodynamically stable and mentating appropriately. Discussed findings and plan with patient/guardian, who agrees with care plan. All questions answered. Return precautions discussed and outpatient follow up given.    Final Clinical Impressions(s) / ED Diagnoses   Final diagnoses:  Epilepsy, generalized, convulsive ([place])  Corticobasal degeneration    ED Discharge Orders        Ordered    cephALEXin (KEFLEX) 250 MG/5ML suspension  2 times daily     10/13/17 1614    Valproate Sodium (DEPAKENE) 250 MG/5ML SOLN solution  2 times daily     10/13/17 1614       Mirabella Hilario, Charna Elizabeth 10/13/17 1616    Quintella Reichert, MD 10/15/17 [phone]

## 2017-10-13 NOTE — ED Notes (Signed)
Patient was able to walk down the hall with assistance from two people for balance.  Patient took small steps and was rigid.  Per husband patient seems to be weaker than usual, he states she did not have any food this morning.

## 2017-10-14 LAB — URINE CULTURE

## 2017-10-15 ENCOUNTER — Telehealth: Payer: Self-pay | Admitting: Neurology

## 2017-10-16 ENCOUNTER — Encounter: Payer: Self-pay | Admitting: Neurology

## 2017-10-16 ENCOUNTER — Ambulatory Visit (INDEPENDENT_AMBULATORY_CARE_PROVIDER_SITE_OTHER): Payer: Medicare Other | Admitting: Neurology

## 2017-10-16 DIAGNOSIS — G3185 Corticobasal degeneration: Secondary | ICD-10-CM

## 2017-10-16 DIAGNOSIS — G40309 Generalized idiopathic epilepsy and epileptic syndromes, not intractable, without status epilepticus: Secondary | ICD-10-CM

## 2017-10-16 MED ORDER — VALPROATE SODIUM 250 MG/5ML PO SOLN: ORAL | 11 refills | Status: AC

## 2017-10-16 NOTE — Patient Instructions (Addendum)
1. Take Depakote 250mg /28mL: 50mL in AM, 48mL in PM 2. Continue antibiotics 3. Follow-up as scheduled in February 2020, call for any changes

## 2017-10-16 NOTE — Progress Notes (Signed)
NEUROLOGY FOLLOW UP OFFICE NOTE  Helen Cabrera [id number]  DOB: 24-Jan-1956  HISTORY OF PRESENT ILLNESS: I had the pleasure of seeing Helen Cabrera in follow-up in the neurology clinic on 10/16/2017.  The patient was last seen a month ago. She had new onset seizures in the setting of a neurodegenerative condition (?corticobasal degeneration). She is again accompanied by her husband who provides the history as she is mostly non-verbal and does not follow commands. Since her last visit, she was in the ER last 10/13/17 with another seizure. Her husband had given her her usual Depakote dose that morning, then as they were walking around the house she fell and he saw her have a tonic seizure with increased salivation. Her face was red, breathing was raspy after. She was slightly more responsive when EMS arrived. She was brought to [facility] where Depakote level was 36 (she is on a low dose of Depakote 269m/5mL, taking 361mBID). She was also found to have a UTI and discharged home on antibiotic. Her husband was instructed to increase Depakote to 100030mID, however he reports that she was still out of it until yesterday and wonders if this is due to Ativan given in ER or higher Depakote dose. She is now back to baseline, walking around the office, unresponsive with her left arm flexed over her abdomen. Husband reports she is eating well. She continues to have 24/7 care. She has not been sleeping as well since the seizure.   HPI 13/31/2017: This is a pleasant [age] RH woman with a history of early-onset dementia, hypertension, hyperlipidemia, who presented for new onset seizures. Her husband reports 2 seizures, one in July, and another in August, but on review of records, it appears the first event which [facility]. [place] were at [place] courthouse when he saw her fall to the ground with whole body jerking, eyes rolled back, gasping breathing for 10-15 minutes. [facility] had an MRI brain which I personally reviewed, no acute changes seen. There was mild diffuse atrophy with ventriculomegaly. Routine EEG showed mild posterior background slowing. She was discharged home with no seizure medication since this was the first event. She had another episode which her husband reports [facility], but no records seen. PCP note in September indicated her husband reported that [place] and sitting in the backseat when she suddenly gasped and fell on his shoulder, unresponsive with a little jerking, less intense than the first event. [facility] her husband reports Rivastigmine patch was discontinued and Keppra was started. She has been taking Keppra 500m65mD for the past month or so with no further similar episodes, however her husband is concerned that she is getting more aggravated with her family. Prior to starting Keppra, she had some behavioral issues with paranoia, but her husband reports she is now getting physical with her family, even with her husband. She tells him she is going to leave or tells people to get out of the house. She has a hard time staying still, always wanting to walk around, then getting aggravated when redirected. Her sense of direction is now "way off." He ran out of Keppra 10 days ago and called his PCP, but tells me that she has been taking ?Cephalexin?. He did not bring the bottle and  will confirm with [place].   No prior history of seizures. As far as her husband knows, she had a normal birth and early development.  There is no history of febrile convulsions, CNS infections such as meningitis/encephalitis, significant traumatic brain injury, neurosurgical procedures, or family history of seizures. There is a strong family history of Alzheimer's disease in her mother (diagnosed before age 32),  maternal aunt and uncle. She had been working for [employer] for 40 years but started making mistakes around age 71 or 61. She initially saw neurologist Dr. Krista Blue, records unavailable for review, then saw Dr. Dimas Millin at [facility] in 2013. At that time, notes indicate she had a head CT showing a 1cm right tentorial meningioma confirmed by MRI. She had normal B12, ESR, RPR, ANA, TSH, Sjogren's antibodies, and SPEP. She had neuropsychological evaluation which suggested that depression and stress were likely playing a role. It was felt that symptoms likely represent early Alzheimer's dementia worsened by underlying psychiatric issues. MMSE in 01/2013 was 24/30. She was started on Aricept and Namenda, which caused side effects. She has been on the Exelon patch for at least 2 years. She is dependent on her husband for bathing, dressing. She can feed herself but needs supervision. They have started using Depends because she was wetting the bed at night, but is able to go to the bathroom during the daytime. She has hallucinations of people in the room only at night. She wakes up around 3am, agitated for an hour then goes back to sleep. Her husband has noticed that when she wakes up she cannot communicate as well, stammering and unable to finish her sentences, then once more awake, she is not as bad. She is noted to have myoclonic jerking of the left > right UE, left leg, which her husband reports started while she was still working at age 57, she would drop a drink from her hand. This has progressively worsened where sometimes she has a hand tremor.  PAST MEDICAL HISTORY: Past Medical History:  Diagnosis Date  . Anemia    Iron def   . Bacterial overgrowth syndrome   . Chronic headaches   . Dementia   . Depression   . Diverticulosis of colon (without mention of hemorrhage)   . Family history of malignant neoplasm of gastrointestinal tract   . GERD (gastroesophageal reflux disease)   . Headache(784.0)   .  Hypercholesterolemia   . Hypertension   . IBS (irritable bowel syndrome)   . Osteoarthritis   . Pancreatitis   . Unspecified gastritis and gastroduodenitis without mention of hemorrhage   . Vitamin B12 deficiency     MEDICATIONS:   Outpatient Encounter Medications as of 10/16/2017  Medication Sig Note  . atenolol (TENORMIN) 100 MG tablet TAKE 1 TABLET EVERY DAY FOR BLOOD PRESSURE (Patient taking differently: Take 50 mg by mouth at bedtime. )   . cephALEXin (KEFLEX) 250 MG/5ML suspension Take 10 mLs (500 mg total) by mouth 2 (two) times daily for 10 days.   Marland Kitchen guaiFENesin (MUCINEX) 600 MG 12 hr tablet Take 600 mg by mouth daily as needed.   . polyethylene glycol (MIRALAX / GLYCOLAX) packet Take 17 g by mouth daily. 02/21/2017: As needed  . Valproate Sodium (DEPAKENE) 250 MG/5ML SOLN solution Take 20 mLs (1,000 mg total) by mouth 2 (two) times daily. Take 3 mL twice a day   . vitamin B-12 (CYANOCOBALAMIN) 100 MCG tablet Take 100 mcg by mouth daily.   . Vitamin D, Ergocalciferol, (DRISDOL) 50000  units CAPS capsule TAKE 1 CAPSULE DAILY OR AS DIRECTED FOR SEVERE VIT D DEFICIENCY    No facility-administered encounter medications on file as of 10/16/2017.    ALLERGIES: Allergies  Allergen Reactions  . Amitriptyline Other (See Comments)    dysphoria  . Carafate [Sucralfate] Other (See Comments)    constipation  . Amitriptyline Other (See Comments)    dysphoria  . Carafate [Sucralfate] Other (See Comments)    constipation  . Codeine Other (See Comments)    Insomnia; agitation; restlessness   . Lexapro [Escitalopram Oxalate] Other (See Comments)    Insomnia; agitation; restlessness   . Paxil [Paroxetine Hcl] Other (See Comments)    Insomnia; agitation; restlessness   . Prednisone Other (See Comments)    Unknown reaction  . Promethazine Nausea Only  . Zoloft [Sertraline Hcl] Other (See Comments)    Insomnia; agitation; restlessness   . Codeine Other (See Comments)    Insomnia; agitation;  restlessness   . Lexapro [Escitalopram Oxalate] Other (See Comments)    Insomnia; agitation; restlessness   . Paxil [Paroxetine Hcl] Other (See Comments)    Insomnia; agitation; restlessness   . Prednisone Other (See Comments)    unknown  . Promethazine Hcl Nausea Only  . Zoloft [Sertraline Hcl] Other (See Comments)    Insomnia; agitation; restlessness     FAMILY HISTORY: Family History  Problem Relation Age of Onset  . Diabetes Mother   . Alcohol abuse Father   . Cirrhosis Father   . Breast cancer Maternal Aunt   . Colon cancer Maternal Aunt   . Stomach cancer Brother   . Esophageal cancer Brother   . Diabetes Brother   . Diabetes Maternal Uncle   . Heart disease Unknown        grandfather/grandmother    SOCIAL HISTORY: Social History   Socioeconomic History  . Marital status: Married    Spouse name: Not on file  . Number of children: 4  . Years of education: Not on file  . Highest education level: Not on file  Occupational History  . Occupation: Research scientist (physical sciences): [employer]  Social Needs  . Financial resource strain: Not on file  . Food insecurity:    Worry: Not on file    Inability: Not on file  . Transportation needs:    Medical: Not on file    Non-medical: Not on file  Tobacco Use  . Smoking status: Never Smoker  . Smokeless tobacco: Never Used  Substance and Sexual Activity  . Alcohol use: Yes    Alcohol/week: 0.0 oz    Comment: occ- very rarely  . Drug use: No  . Sexual activity: Not on file  Lifestyle  . Physical activity:    Days per week: Not on file    Minutes per session: Not on file  . Stress: Not on file  Relationships  . Social connections:    Talks on phone: Not on file    Gets together: Not on file    Attends religious service: Not on file    Active member of club or organization: Not on file    Attends meetings of clubs or organizations: Not on file    Relationship status: Not on file  . Intimate partner violence:     Fear of current or ex partner: Not on file    Emotionally abused: Not on file    Physically abused: Not on file    Forced sexual activity: Not on file  Other Topics Concern  . Not on file  Social History Narrative   ** Merged History Encounter **        REVIEW OF SYSTEMS unable to obtain due to mental status  PHYSICAL EXAM: There were no vitals filed for this visit. (unable to obtain, patient became agitated when touched by staff, similar to prior) General: She is again pacing around the room, unresponsive to any commands. No myoclonic jerks noted during today's visit Head:  Normocephalic/atraumatic Eyes: Right eye opaque Neck: supple, no paraspinal tenderness, full range of motion Back: No paraspinal tenderness Heart: regular rate and rhythm Lungs: Clear to auscultation bilaterally. Vascular: No carotid bruits. Skin/Extremities: No rash, no edema Neurological Exam: Mental status: awake and alert, no verbal output today, does not follow commands Cranial nerves: CN I: not tested CN II: right eye opaque, no light perception. Left pupil round and reactive to light, visual fields intact but again appears to have some left-sided neglect CN III, IV, VI:  full range of motion, no nystagmus, no ptosis CN VII: upper and lower face symmetric Bulk & Tone: increased with +cogwheeling bilaterally, no fasciculations. Motor: 5/5 on right, 4/5 on left with left arm kept at flexion (similar to prior) Sensation: she screamed out when husband slightly pinched right ear, no response to similar stimulation of left ear Gait: slow and cautious, hunched, reduced arm swing bilaterally, keeping left arm flexed (similar to prior) Tremor: none noted today  IMPRESSION: This is a 62 yo RH woman with a history of early-onset dementia, hypertension, hyperlipidemia, with new onset seizures. In the early stages of her dementia, she was diagnosed with early onset Alzheimer's disease, however presentation over  her follow-up visits showed asymmetric exam, left hemineglect, myoclonus, cogwheeling, and report of tremor, suggestive of corticobasal degeneration. Seizures are not typically seen with this condition, however patients with dementia do have a higher frequency of seizures. She has had 3 seizures now, most recently last 10/13/17. Prior routine EEG reported diffuse slowing, MRI brain shows diffuse atrophy. She has been on a low dose of Depakote, husband is concerned about sudden increase in dose to 1026m BID, we agreed to increase to 2562m(6m2min AM, 500m18m0mL67m PM. He knows to call for any changes. She has 24/7 care, continue supportive care. She will follow-up as scheduled in 1 year, husband knows to call for any changes.   Thank you for allowing me to participate in her care.  Please do not hesitate to call for any questions or concerns.  The duration of this appointment visit was 24 minutes of face-to-face time with the patient.  Greater than 50% of this time was spent in counseling, explanation of diagnosis, planning of further management, and coordination of care.   KarenEllouise Newer.   CC: Dr. McKeoMelford Aase

## 2017-11-06 ENCOUNTER — Encounter: Payer: Self-pay | Admitting: Adult Health

## 2017-11-06 NOTE — Progress Notes (Deleted)
MEDICARE ANNUAL WELLNESS VISIT AND FOLLOW UP  Assessment:    Diagnoses and all orders for this visit:  Encounter for Medicare annual wellness exam  Essential hypertension Continue medications Monitor blood pressure at home; call if consistently over 130/80 Continue DASH diet.   Reminder to go to the ER if any CP, SOB, nausea, dizziness, severe HA, changes vision/speech, left arm numbness and tingling and jaw pain.  Epilepsy, generalized, convulsive ([place]) No recent seizures; continue depakote Check valproic acid level  Vitamin D deficiency At goal at recent check; continue to recommend supplementation for goal of 70-100 Defer vitamin D level  Medication management CBC, BMP/GFR, LFTs  Mixed hyperlipidemia Not on statin secondary to advanced dementia Continue to encourage low cholesterol diet and exercise.  Check lipid panel.   BMI 21.0-21.9, adult Continue to recommend diet heavy in fruits and veggies and low in animal meats, cheeses, and dairy products, appropriate calorie intake Discuss exercise recommendations routinely Continue to monitor weight at each visit  Gastroesophageal reflux disease, esophagitis presence not specified No longer on treatment Discussed diet, avoiding triggers and other lifestyle changes  Diverticulosis of colon Bowel management discussed  Increase fiber/fluids Likely no future colonoscopies secondary to advanced dementia would not tolerate  Corticobasal degeneration Fully dependent for ADLs Followed by Dr. Delice Lesch  Osteoarthritis, unspecified osteoarthritis type, unspecified site ***  Vitamin B 12 deficiency Continue with supplement, check levels annually at CPE   Over 40 minutes of exam, counseling, chart review and critical decision making was performed Future Appointments  Date Time Provider [place]  11/07/2017  4:00 PM Liane Comber, NP [facility] None  02/12/2018  2:00 PM [name], MD [facility] None   09/03/2018  4:00 PM Cameron Sprang, MD [facility] None     Plan:   During the course of the visit the patient was educated and counseled about appropriate screening and preventive services including:    Pneumococcal vaccine   Prevnar 13  Influenza vaccine  Td vaccine  Screening electrocardiogram  Bone densitometry screening  Colorectal cancer screening  Diabetes screening  Glaucoma screening  Nutrition counseling   Advanced directives: requested   Subjective:  Helen Cabrera is a 62 y.o. female who presents for Medicare Annual Wellness Visit and 3 month follow up. Other problems include progressive severe Dementia designated "Corticobasal Degeneration" by Dr Delice Lesch and requires 24/7 supervision and assistance from her husband. She also has hx of epilepsy/ treated by depakote since without recent seizures ***  BMI is There is no height or weight on file to calculate BMI., she {HAS HAS NWG:95621} been working on diet and exercise. Wt Readings from Last 3 Encounters:  08/07/17 118 lb 9.6 oz (53.8 kg)  05/07/17 115 lb 9.6 oz (52.4 kg)  02/26/17 119 lb 6.4 oz (54.2 kg)   . Her blood pressure {HAS HAS NOT:18834} been controlled at home, today their BP is   She {DOES_DOES HYQ:65784} workout. She denies chest pain, shortness of breath, dizziness.   She is not on cholesterol medication secondary to advanced dementia. Her cholesterol {ACTION; IS/IS NOT:21021397} at goal. The cholesterol last visit was:   Lab Results  Component Value Date   CHOL 233 (H) 01/17/2017   HDL 72 01/17/2017   LDLCALC 144 (H) 01/17/2017   TRIG 85 01/17/2017   CHOLHDL 3.2 01/17/2017    Last A1C in the office was:  Lab Results  Component Value Date   HGBA1C 5.5 01/17/2017   Last GFR: Lab Results  Component Value Date  GFRNONAA >60 10/13/2017   Patient is on Vitamin D supplement and at goal at last check without recent dose changes:    Lab Results  Component Value Date   VD25OH 74  05/07/2017      Medication Review: Current Outpatient Medications on File Prior to Visit  Medication Sig Dispense Refill  . atenolol (TENORMIN) 100 MG tablet TAKE 1 TABLET EVERY DAY FOR BLOOD PRESSURE (Patient taking differently: Take 50 mg by mouth at bedtime. ) 90 tablet 1  . guaiFENesin (MUCINEX) 600 MG 12 hr tablet Take 600 mg by mouth daily as needed.    . polyethylene glycol (MIRALAX / GLYCOLAX) packet Take 17 g by mouth daily. 30 each 2  . Valproate Sodium (DEPAKENE) 250 MG/5ML SOLN solution Take 72mL in AM, 21mL in PM 450 mL 11  . vitamin B-12 (CYANOCOBALAMIN) 100 MCG tablet Take 100 mcg by mouth daily.    . Vitamin D, Ergocalciferol, (DRISDOL) 50000 units CAPS capsule TAKE 1 CAPSULE DAILY OR AS DIRECTED FOR SEVERE VIT D DEFICIENCY 90 capsule 1   No current facility-administered medications on file prior to visit.     Allergies  Allergen Reactions  . Amitriptyline Other (See Comments)    dysphoria  . Carafate [Sucralfate] Other (See Comments)    constipation  . Amitriptyline Other (See Comments)    dysphoria  . Carafate [Sucralfate] Other (See Comments)    constipation  . Codeine Other (See Comments)    Insomnia; agitation; restlessness   . Lexapro [Escitalopram Oxalate] Other (See Comments)    Insomnia; agitation; restlessness   . Paxil [Paroxetine Hcl] Other (See Comments)    Insomnia; agitation; restlessness   . Prednisone Other (See Comments)    Unknown reaction  . Promethazine Nausea Only  . Zoloft [Sertraline Hcl] Other (See Comments)    Insomnia; agitation; restlessness   . Codeine Other (See Comments)    Insomnia; agitation; restlessness   . Lexapro [Escitalopram Oxalate] Other (See Comments)    Insomnia; agitation; restlessness   . Paxil [Paroxetine Hcl] Other (See Comments)    Insomnia; agitation; restlessness   . Prednisone Other (See Comments)    unknown  . Promethazine Hcl Nausea Only  . Zoloft [Sertraline Hcl] Other (See Comments)    Insomnia;  agitation; restlessness     Current Problems (verified) Patient Active Problem List   Diagnosis Date Noted  . Corticobasal degeneration 02/27/2017  . Epilepsy, generalized, convulsive ([place]) 02/27/2017  . Left arm weakness 03/02/2016  . Vitamin D deficiency 10/09/2013  . Medication management 10/09/2013  . Iron deficiency anemia 01/25/2010  . Vitamin B 12 deficiency 10/11/2009  . Hyperlipidemia 10/11/2009  . Essential hypertension 10/11/2009  . GERD 10/11/2009  . Diverticulosis of colon 10/11/2009  . Osteoarthritis 10/11/2009    Screening Tests Immunization History  Administered Date(s) Administered  . PPD Test 04/16/2014  . Pneumococcal Polysaccharide-23 04/03/2012  . Tdap 04/03/2012, 02/05/2017    Preventative care: Last colonoscopy: 02/28/2012 MGM 2015 DEXA 2006 CT head 07/2016 CT AB/Pelvis 06/2014  Prior vaccinations: TD or Tdap: 2018  Influenza: ***  Pneumococcal: 2013 Prevnar13: - Shingles/Zostavax: n/a in office   Names of Other Physician/Practitioners you currently use: 1. [facility] here for primary care 2. Dr Audry Pili & Dr Deloria Lair, eye doctor, last visit 08/2016 3. Dr Georgeanna Harrison, DDS, dentist, last visit April 2018  Patient Care Team: [name], MD as PCP - General (Internal Medicine) [name], MD (Internal Medicine) Sable Feil, MD as  Consulting Physician (Gastroenterology) Zadie Rhine Clent Demark, MD as Consulting Physician (Ophthalmology) Darleen Crocker, MD as Consulting Physician (Ophthalmology)  SURGICAL HISTORY She  has a past surgical history that includes Cesarean section; Abdominal hysterectomy; Appendectomy; Cesarean section; and Pars plana vitrectomy (Right, 04/02/2014). FAMILY HISTORY Her family history includes Alcohol abuse in her father; Breast cancer in her maternal aunt; Cirrhosis in her father; Colon cancer in her maternal aunt; Diabetes in her brother, maternal uncle,  and mother; Esophageal cancer in her brother; Heart disease in her unknown relative; Stomach cancer in her brother. SOCIAL HISTORY She  reports that she has never smoked. She has never used smokeless tobacco. She reports that she drinks alcohol. She reports that she does not use drugs.   MEDICARE WELLNESS OBJECTIVES: Physical activity:   Cardiac risk factors:   Depression/mood screen:   Depression screen [facility] 2/9 10/17/2016  Decreased Interest 0  Down, Depressed, Hopeless 0  PHQ - 2 Score 0  Altered sleeping -  Tired, decreased energy -  Change in appetite -  Feeling bad or failure about yourself  -  Trouble concentrating -  Moving slowly or fidgety/restless -  Suicidal thoughts -  PHQ-9 Score -  Difficult doing work/chores -    ADLs:  In your present state of health, do you have any difficulty performing the following activities: 08/07/2017 01/17/2017  Hearing? N N  Vision? (No Data) Y  Comment unable to determine blind Rt eye  Difficulty concentrating or making decisions? Y N  Comment severe dementia severe Dementia  Walking or climbing stairs? Y Y  Comment unstable gait - husband supports 3-4(+) max assistance requires support by spouse  Dressing or bathing? Tempie Donning  Comment husband clothes require 4+ max assistance in ADL's  Doing errands, shopping? Tempie Donning  Comment husband transports requires transport  Some recent data might be hidden     Cognitive Testing  Alert? Yes  Normal Appearance?Yes  Oriented to person? Yes  Place? Yes   Time? Yes  Recall of three objects?  Yes  Can perform simple calculations? Yes  Displays appropriate judgment?Yes  Can read the correct time from a watch face?Yes  EOL planning:    Review of Systems  Constitutional: Negative for malaise/fatigue and weight loss.  HENT: Negative for hearing loss and tinnitus.   Eyes: Negative for blurred vision and double vision.  Respiratory: Negative for cough, sputum production, shortness of breath and  wheezing.   Cardiovascular: Negative for chest pain, palpitations, orthopnea, claudication, leg swelling and PND.  Gastrointestinal: Negative for abdominal pain, blood in stool, constipation, diarrhea, heartburn, melena, nausea and vomiting.  Genitourinary: Negative.   Musculoskeletal: Negative for falls, joint pain and myalgias.  Skin: Negative for rash.  Neurological: Negative for dizziness, tingling, sensory change, weakness and headaches.  Endo/Heme/Allergies: Negative for polydipsia.  Psychiatric/Behavioral: Negative.  Negative for depression, memory loss, substance abuse and suicidal ideas. The patient is not nervous/anxious and does not have insomnia.   All other systems reviewed and are negative.    Objective:     There were no vitals filed for this visit. There is no height or weight on file to calculate BMI.  General appearance: alert, no distress, WD/WN, female HEENT: normocephalic, sclerae anicteric, TMs pearly, nares patent, no discharge or erythema, pharynx normal Oral cavity: MMM, no lesions Neck: supple, no lymphadenopathy, no thyromegaly, no masses Heart: RRR, normal S1, S2, no murmurs Lungs: CTA bilaterally, no wheezes, rhonchi, or rales Abdomen: +bs, soft, non tender, non distended, no masses,  no hepatomegaly, no splenomegaly Musculoskeletal: nontender, no swelling, no obvious deformity Extremities: no edema, no cyanosis, no clubbing Pulses: 2+ symmetric, upper and lower extremities, normal cap refill Neurological: alert, oriented x 3, CN2-12 intact, strength normal upper extremities and lower extremities, sensation normal throughout, DTRs 2+ throughout, no cerebellar signs, gait normal Psychiatric: normal affect, behavior normal, pleasant   Medicare Attestation I have personally reviewed: The patient's medical and social history Their use of alcohol, tobacco or illicit drugs Their current medications and supplements The patient's functional ability including  ADLs,fall risks, home safety risks, cognitive, and hearing and visual impairment Diet and physical activities Evidence for depression or mood disorders  The patient's weight, height, BMI, and visual acuity have been recorded in the chart.  I have made referrals, counseling, and provided education to the patient based on review of the above and I have provided the patient with a written personalized care plan for preventive services.     Izora Ribas, NP   11/06/2017

## 2017-11-06 NOTE — Progress Notes (Deleted)
FOLLOW UP  Assessment and Plan:   Hypertension At goal; continue medications Monitor blood pressure at home; patient to call if consistently greater than 130/80 Continue DASH diet.   Reminder to go to the ER if any CP, SOB, nausea, dizziness, severe HA, changes vision/speech, left arm numbness and tingling and jaw pain.  Cholesterol Currently above goal, but treated by lifestyle secondary to advanced dementia Continue low cholesterol diet and exercise.  Check lipid panel.   Other glucose Recent A1Cs at goal Discussed diet/exercise, weight management  Defer A1C; check BMP  BMI 21*** Continue to recommend diet heavy in fruits and veggies and low in animal meats, cheeses, and dairy products, appropriate calorie intake Discuss exercise recommendations routinely Continue to monitor weight at each visit  Vitamin D Def At goal at last visit; continue supplementation to maintain goal of 70-100 Check Vit D level  Epilepsy No seizures in last 3 months Check depakote levels  Continue diet and meds as discussed. Further disposition pending results of labs. Discussed med's effects and SE's.   Over 30 minutes of exam, counseling, chart review, and critical decision making was performed.   Future Appointments  Date Time Provider [place]  11/07/2017  4:00 PM Liane Comber, NP [facility] None  02/12/2018  2:00 PM [name], MD [facility] None  09/03/2018  4:00 PM Cameron Sprang, MD [facility] None    ----------------------------------------------------------------------------------------------------------------------  HPI 62 y.o. female  presents for 3 month follow up on hypertension, cholesterol, glucose management, weight and vitamin D deficiency. Other problems include progressive severe Dementia designated "Corticobasal Degeneration" by Dr Delice Lesch and requires 24/7 supervision and assistance from her husband. She also has hx of epilepsy/ treated by depakote without  recent seizures ***  BMI is There is no height or weight on file to calculate BMI., she {HAS HAS EUM:35361} been working on diet and exercise. Wt Readings from Last 3 Encounters:  08/07/17 118 lb 9.6 oz (53.8 kg)  05/07/17 115 lb 9.6 oz (52.4 kg)  02/26/17 119 lb 6.4 oz (54.2 kg)   Her blood pressure {HAS HAS NOT:18834} been controlled at home, today their BP is    She {DOES_DOES WER:15400} workout. She denies chest pain, shortness of breath, dizziness.   She is not on cholesterol medication and denies myalgias. Her cholesterol is not at goal. The cholesterol last visit was:   Lab Results  Component Value Date   CHOL 233 (H) 01/17/2017   HDL 72 01/17/2017   LDLCALC 144 (H) 01/17/2017   TRIG 85 01/17/2017   CHOLHDL 3.2 01/17/2017    She {Has/has not:18111} been working on diet and exercise for glucose management, and denies {Symptoms; diabetes w/o none:19199}. Last A1C in the office was:  Lab Results  Component Value Date   HGBA1C 5.5 01/17/2017   Patient is on Vitamin D supplement and at goal at last check:    Lab Results  Component Value Date   VD25OH 74 05/07/2017        Current Medications:  Current Outpatient Medications on File Prior to Visit  Medication Sig  . atenolol (TENORMIN) 100 MG tablet TAKE 1 TABLET EVERY DAY FOR BLOOD PRESSURE (Patient taking differently: Take 50 mg by mouth at bedtime. )  . guaiFENesin (MUCINEX) 600 MG 12 hr tablet Take 600 mg by mouth daily as needed.  . polyethylene glycol (MIRALAX / GLYCOLAX) packet Take 17 g by mouth daily.  . Valproate Sodium (DEPAKENE) 250 MG/5ML SOLN solution Take 65mL in AM, 72mL in PM  .  vitamin B-12 (CYANOCOBALAMIN) 100 MCG tablet Take 100 mcg by mouth daily.  . Vitamin D, Ergocalciferol, (DRISDOL) 50000 units CAPS capsule TAKE 1 CAPSULE DAILY OR AS DIRECTED FOR SEVERE VIT D DEFICIENCY   No current facility-administered medications on file prior to visit.      Allergies:  Allergies  Allergen Reactions  .  Amitriptyline Other (See Comments)    dysphoria  . Carafate [Sucralfate] Other (See Comments)    constipation  . Amitriptyline Other (See Comments)    dysphoria  . Carafate [Sucralfate] Other (See Comments)    constipation  . Codeine Other (See Comments)    Insomnia; agitation; restlessness   . Lexapro [Escitalopram Oxalate] Other (See Comments)    Insomnia; agitation; restlessness   . Paxil [Paroxetine Hcl] Other (See Comments)    Insomnia; agitation; restlessness   . Prednisone Other (See Comments)    Unknown reaction  . Promethazine Nausea Only  . Zoloft [Sertraline Hcl] Other (See Comments)    Insomnia; agitation; restlessness   . Codeine Other (See Comments)    Insomnia; agitation; restlessness   . Lexapro [Escitalopram Oxalate] Other (See Comments)    Insomnia; agitation; restlessness   . Paxil [Paroxetine Hcl] Other (See Comments)    Insomnia; agitation; restlessness   . Prednisone Other (See Comments)    unknown  . Promethazine Hcl Nausea Only  . Zoloft [Sertraline Hcl] Other (See Comments)    Insomnia; agitation; restlessness      Medical History:  Past Medical History:  Diagnosis Date  . Anemia    Iron def   . Bacterial overgrowth syndrome   . Chronic headaches   . Dementia   . Depression   . Diverticulosis of colon (without mention of hemorrhage)   . Family history of malignant neoplasm of gastrointestinal tract   . GERD (gastroesophageal reflux disease)   . Headache(784.0)   . Hypercholesterolemia   . Hypertension   . IBS (irritable bowel syndrome)   . Osteoarthritis   . Pancreatitis   . Unspecified gastritis and gastroduodenitis without mention of hemorrhage   . Vitamin B12 deficiency    Family history- Reviewed and unchanged Social history- Reviewed and unchanged   Review of Systems:  Review of Systems  Constitutional: Negative for malaise/fatigue and weight loss.  HENT: Negative for hearing loss and tinnitus.   Eyes: Negative for blurred  vision and double vision.  Respiratory: Negative for cough, shortness of breath and wheezing.   Cardiovascular: Negative for chest pain, palpitations, orthopnea, claudication and leg swelling.  Gastrointestinal: Negative for abdominal pain, blood in stool, constipation, diarrhea, heartburn, melena, nausea and vomiting.  Genitourinary: Negative.   Musculoskeletal: Negative for joint pain and myalgias.  Skin: Negative for rash.  Neurological: Negative for dizziness, tingling, sensory change, seizures, weakness and headaches.  Endo/Heme/Allergies: Negative for polydipsia.  Psychiatric/Behavioral: Negative.   All other systems reviewed and are negative.     Physical Exam: There were no vitals taken for this visit. Wt Readings from Last 3 Encounters:  08/07/17 118 lb 9.6 oz (53.8 kg)  05/07/17 115 lb 9.6 oz (52.4 kg)  02/26/17 119 lb 6.4 oz (54.2 kg)   General Appearance: Well nourished, in no apparent distress. Eyes: PERRLA, EOMs, conjunctiva no swelling or erythema Sinuses: No Frontal/maxillary tenderness ENT/Mouth: Ext aud canals clear, TMs without erythema, bulging. No erythema, swelling, or exudate on post pharynx.  Tonsils not swollen or erythematous. Hearing normal.  Neck: Supple, thyroid normal.  Respiratory: Respiratory effort normal, BS equal bilaterally without rales, rhonchi,  wheezing or stridor.  Cardio: RRR with no MRGs. Brisk peripheral pulses without edema.  Abdomen: Soft, + BS.  Non tender, no guarding, rebound, hernias, masses. Lymphatics: Non tender without lymphadenopathy.  Musculoskeletal: Full ROM, 5/5 strength, {PSY - GAIT AND STATION:22860} gait Skin: Warm, dry without rashes, lesions, ecchymosis.  Neuro: Cranial nerves intact. No cerebellar symptoms.  Psych: Awake and oriented X 3, normal affect, Insight and Judgment appropriate.    Izora Ribas, NP 8:50 AM [facility]

## 2017-11-07 ENCOUNTER — Ambulatory Visit: Payer: Self-pay | Admitting: Adult Health

## 2017-11-12 ENCOUNTER — Telehealth: Payer: Self-pay | Admitting: Licensed Clinical Social Worker

## 2017-11-13 NOTE — Telephone Encounter (Signed)
Palliative Care SW spoke with patient's husband, Gwyndolyn Saxon, and asked to schedule a visit.He asked SW call back next week to set up a date/time.

## 2017-11-20 ENCOUNTER — Telehealth: Payer: Self-pay | Admitting: Licensed Clinical Social Worker

## 2017-11-20 NOTE — Progress Notes (Signed)
MEDICARE ANNUAL WELLNESS VISIT AND FOLLOW UP  Assessment:    Diagnoses and all orders for this visit:  Encounter for Medicare annual wellness exam  Essential hypertension Continue medications Monitor blood pressure at home; call if consistently over 130/80 Continue DASH diet.   Reminder to go to the ER if any CP, SOB, nausea, dizziness, severe HA, changes vision/speech, left arm numbness and tingling and jaw pain.  Epilepsy, generalized, convulsive ([place]) Recent mild seizure, thought to be r/t UTI which was treated and resolved; continue depakote Check valproic acid level   Vitamin D deficiency At goal at recent check; continue to recommend supplementation for goal of 70-100 Defer vitamin D level  Medication management CBC, BMP/GFR, LFTs  Mixed hyperlipidemia Not on statin secondary to advanced dementia Continue to encourage low cholesterol diet and exercise.  Check lipid panel.   BMI 21.0-21.9, adult Currently eating well, 100% at each meal  Continue to monitor weight at each visit  Gastroesophageal reflux disease, esophagitis presence not specified No longer on treatment Discussed diet, avoiding triggers  Diverticulosis of colon Bowel management discussed  Increase fiber/fluids No future colonoscopies secondary to advanced dementia would not tolerate  Corticobasal degeneration Fully dependent for ADLs Followed by Dr. Delice Lesch  Osteoarthritis, unspecified osteoarthritis type, unspecified site Patient has not indicated pain, very limited communication  Vitamin B 12 deficiency Continue with supplement if tolerating, low priority   Over 40 minutes of exam, counseling, chart review and critical decision making was performed Future Appointments  Date Time Provider [place]  02/12/2018  2:00 PM [name], MD [facility] None  09/03/2018  4:00 PM Cameron Sprang, MD [facility] None     Plan:   During the course of the visit the patient was  educated and counseled about appropriate screening and preventive services including:    Pneumococcal vaccine   Prevnar 13  Influenza vaccine  Td vaccine  Screening electrocardiogram  Bone densitometry screening  Colorectal cancer screening  Diabetes screening  Glaucoma screening  Nutrition counseling   Advanced directives: requested   Subjective:  Helen Cabrera is a 62 y.o. female who presents accompanied by her husband for Medicare Annual Wellness Visit and 3 month follow up. Other problems include progressive severe Dementia designated "Corticobasal Degeneration" by Dr Delice Lesch and requires 24/7 supervision and assistance from her husband. She also has hx of epilepsy/ treated by depakote; she did have a mild recent seizure thought to be secondary to UTI which was treated and has not issues since. She is non-verbal, and stands and wanders throughout visit with intermittent agitation with attempts to interact and assess.   BMI is Body mass index is 20.37 kg/m. Wt Readings from Last 3 Encounters:  11/21/17 115 lb (52.2 kg)  08/07/17 118 lb 9.6 oz (53.8 kg)  05/07/17 115 lb 9.6 oz (52.4 kg)   . Her blood pressure has been controlled at home, today their BP is BP: 138/86 She does not workout. She has not appeared short of breath, in pain, or had any syncopal episodes.   She is not on cholesterol medication secondary to advanced dementia. Her cholesterol is not at goal. The cholesterol last visit was:   Lab Results  Component Value Date   CHOL 233 (H) 01/17/2017   HDL 72 01/17/2017   LDLCALC 144 (H) 01/17/2017   TRIG 85 01/17/2017   CHOLHDL 3.2 01/17/2017    Last A1C in the office was:  Lab Results  Component Value Date   HGBA1C 5.5 01/17/2017  Last GFR: Lab Results  Component Value Date   GFRNONAA >60 10/13/2017   Patient is on Vitamin D supplement and at goal at last check without recent dose changes:    Lab Results  Component Value Date   VD25OH 74  05/07/2017      Medication Review: Current Outpatient Medications on File Prior to Visit  Medication Sig Dispense Refill  . atenolol (TENORMIN) 100 MG tablet TAKE 1 TABLET EVERY DAY FOR BLOOD PRESSURE (Patient taking differently: Take 50 mg by mouth at bedtime. ) 90 tablet 1  . guaiFENesin (MUCINEX) 600 MG 12 hr tablet Take 600 mg by mouth daily as needed.    . polyethylene glycol (MIRALAX / GLYCOLAX) packet Take 17 g by mouth daily. 30 each 2  . Valproate Sodium (DEPAKENE) 250 MG/5ML SOLN solution Take 32mL in AM, 25mL in PM 450 mL 11  . vitamin B-12 (CYANOCOBALAMIN) 100 MCG tablet Take 100 mcg by mouth daily.    . Vitamin D, Ergocalciferol, (DRISDOL) 50000 units CAPS capsule TAKE 1 CAPSULE DAILY OR AS DIRECTED FOR SEVERE VIT D DEFICIENCY 90 capsule 1   No current facility-administered medications on file prior to visit.     Allergies  Allergen Reactions  . Amitriptyline Other (See Comments)    dysphoria  . Carafate [Sucralfate] Other (See Comments)    constipation  . Amitriptyline Other (See Comments)    dysphoria  . Carafate [Sucralfate] Other (See Comments)    constipation  . Codeine Other (See Comments)    Insomnia; agitation; restlessness   . Lexapro [Escitalopram Oxalate] Other (See Comments)    Insomnia; agitation; restlessness   . Paxil [Paroxetine Hcl] Other (See Comments)    Insomnia; agitation; restlessness   . Prednisone Other (See Comments)    Unknown reaction  . Promethazine Nausea Only  . Zoloft [Sertraline Hcl] Other (See Comments)    Insomnia; agitation; restlessness   . Codeine Other (See Comments)    Insomnia; agitation; restlessness   . Lexapro [Escitalopram Oxalate] Other (See Comments)    Insomnia; agitation; restlessness   . Paxil [Paroxetine Hcl] Other (See Comments)    Insomnia; agitation; restlessness   . Prednisone Other (See Comments)    unknown  . Promethazine Hcl Nausea Only  . Zoloft [Sertraline Hcl] Other (See Comments)    Insomnia;  agitation; restlessness     Current Problems (verified) Patient Active Problem List   Diagnosis Date Noted  . Corticobasal degeneration 02/27/2017  . Epilepsy, generalized, convulsive (Colon) 02/27/2017  . Left arm weakness 03/02/2016  . Vitamin D deficiency 10/09/2013  . Medication management 10/09/2013  . Vitamin B 12 deficiency 10/11/2009  . Hyperlipidemia 10/11/2009  . Essential hypertension 10/11/2009  . GERD 10/11/2009  . Diverticulosis of colon 10/11/2009  . Osteoarthritis 10/11/2009    Screening Tests Immunization History  Administered Date(s) Administered  . PPD Test 04/16/2014  . Pneumococcal Polysaccharide-23 04/03/2012  . Tdap 04/03/2012, 02/05/2017    Preventative care: Last colonoscopy: 02/28/2012 DONE MGM 2015 DONE DEXA 2006 DONE CT head 07/2016 CT AB/Pelvis 06/2014  Prior vaccinations: TD or Tdap: 2018  Influenza: -    Pneumococcal: 2013 Prevnar13: - Shingles/Zostavax: n/a in office   Names of Other Physician/Practitioners you currently use: 1. [facility] here for primary care 2. Dr Deloria Lair, eye doctor, last visit 2018 - no longer to follow up per MD 3. Dr Adonis Huguenin, [place], dentist, last visit October 2018  Patient Care Team: [name], MD as PCP - General (  Internal Medicine) [name], MD (Internal Medicine) Sable Feil, MD as Consulting Physician (Gastroenterology) Zadie Rhine Clent Demark, MD as Consulting Physician (Ophthalmology) Darleen Crocker, MD as Consulting Physician (Ophthalmology)  SURGICAL HISTORY She  has a past surgical history that includes Cesarean section; Abdominal hysterectomy; Appendectomy; Cesarean section; and Pars plana vitrectomy (Right, 04/02/2014). FAMILY HISTORY Her family history includes Alcohol abuse in her father; Breast cancer in her maternal aunt; Cirrhosis in her father; Colon cancer in her maternal aunt; Diabetes in her brother, maternal uncle, and mother;  Esophageal cancer in her brother; Heart disease in her unknown relative; Stomach cancer in her brother. SOCIAL HISTORY She  reports that she has never smoked. She has never used smokeless tobacco. She reports that she drinks alcohol. She reports that she does not use drugs.   MEDICARE WELLNESS OBJECTIVES: Physical activity: Current Exercise Habits: The patient does not participate in regular exercise at present Cardiac risk factors: Cardiac Risk Factors include: dyslipidemia;hypertension;sedentary lifestyle Depression/mood screen:  Unable to complete due to advanced dementia; per husband mood has been fair.  Depression screen PHQ 2/9 11/21/2017  Decreased Interest (No Data)  Down, Depressed, Hopeless -  PHQ - 2 Score -  Altered sleeping -  Tired, decreased energy -  Change in appetite -  Feeling bad or failure about yourself  -  Trouble concentrating -  Moving slowly or fidgety/restless -  Suicidal thoughts -  PHQ-9 Score -  Difficult doing work/chores -    ADLs:  In your present state of health, do you have any difficulty performing the following activities: 11/21/2017 08/07/2017  Hearing? Y N  Comment Difficult to assess secondary to dementia -  Vision? Y (No Data)  Comment Blind in right eye unable to determine  Difficulty concentrating or making decisions? Y Y  Comment - severe dementia  Walking or climbing stairs? Y Y  Comment - unstable gait - husband supports 3-4(+) max assistance  Dressing or bathing? Y Y  Comment - husband clothes  Doing errands, shopping? Y Y  Comment Driven by husband husband transports  Conservation officer, nature and eating ? Y -  Using the Toilet? Y -  Comment Wears pull ups -  In the past six months, have you accidently leaked urine? Y -  Do you have problems with loss of bowel control? Y -  Managing your Medications? Y -  Managing your Finances? Y -  Housekeeping or managing your Housekeeping? Y -  Some recent data might be hidden     Cognitive  Testing  Alert? Yes  Normal Appearance? NO  Oriented to person? NO  Place? NO   Time? NO  Recall of three objects?  NO  Can perform simple calculations? NO  Displays appropriate judgment? NO  Can read the correct time from a watch face? NO  EOL planning: Does Patient Have a Medical Advance Directive?: No Would patient like information on creating a medical advance directive?: No - Patient declined  Review of Systems  Unable to perform ROS: Dementia  Musculoskeletal: Negative for falls.    Objective:     Today's Vitals   11/21/17 1524  BP: 138/86  Pulse: 83  Temp: 97.9 F (36.6 C)  SpO2: 91%  Weight: 115 lb (52.2 kg)   Body mass index is 20.37 kg/m.  General appearance: alert,  female of good dress and hygiene for mental state HEENT: normocephalic, sclerae anicteric, unable to visualize TMs due to agitation Oral cavity: MMM, no lesions Neck: supple, no lymphadenopathy, no thyromegaly,  no masses Heart: RRR, normal S1, S2, no murmurs Lungs: CTA bilaterally, no wheezes, rhonchi, or rales Abdomen: +bs, soft, non tender, non distended, no palpable masses Musculoskeletal: nontender, no swelling, no obvious deformity Extremities: no edema, no cyanosis, no clubbing Pulses: 2+ symmetric, upper and lower extremities, normal cap refill Neurological: alert, oriented x 0, shuffling/wandering gait  Psychiatric: intermittently agitated  Medicare Attestation I have personally reviewed: The patient's medical and social history Their use of alcohol, tobacco or illicit drugs Their current medications and supplements The patient's functional ability including ADLs,fall risks, home safety risks, cognitive, and hearing and visual impairment Diet and physical activities Evidence for depression or mood disorders  The patient's weight, height, BMI, and visual acuity have been recorded in the chart.  I have made referrals, counseling, and provided education to the patient based on review of  the above and I have provided the patient with a written personalized care plan for preventive services.     Izora Ribas, NP   11/21/2017

## 2017-11-20 NOTE — Telephone Encounter (Signed)
Palliative Care SW attempted to leave a message with pt's husband to set up a home visit.

## 2017-11-21 ENCOUNTER — Encounter: Payer: Self-pay | Admitting: Adult Health

## 2017-11-21 ENCOUNTER — Ambulatory Visit (INDEPENDENT_AMBULATORY_CARE_PROVIDER_SITE_OTHER): Payer: Medicare Other | Admitting: Adult Health

## 2017-11-21 ENCOUNTER — Telehealth: Payer: Self-pay | Admitting: Licensed Clinical Social Worker

## 2017-11-21 VITALS — BP 138/86 | HR 83 | Temp 97.9°F | Wt 115.0 lb

## 2017-11-21 DIAGNOSIS — G40309 Generalized idiopathic epilepsy and epileptic syndromes, not intractable, without status epilepticus: Secondary | ICD-10-CM

## 2017-11-21 DIAGNOSIS — G3185 Corticobasal degeneration: Secondary | ICD-10-CM

## 2017-11-21 DIAGNOSIS — E559 Vitamin D deficiency, unspecified: Secondary | ICD-10-CM | POA: Diagnosis not present

## 2017-11-21 DIAGNOSIS — R29898 Other symptoms and signs involving the musculoskeletal system: Secondary | ICD-10-CM

## 2017-11-21 DIAGNOSIS — Z0001 Encounter for general adult medical examination with abnormal findings: Secondary | ICD-10-CM | POA: Diagnosis not present

## 2017-11-21 DIAGNOSIS — K5730 Diverticulosis of large intestine without perforation or abscess without bleeding: Secondary | ICD-10-CM | POA: Diagnosis not present

## 2017-11-21 DIAGNOSIS — E782 Mixed hyperlipidemia: Secondary | ICD-10-CM

## 2017-11-21 DIAGNOSIS — M1990 Unspecified osteoarthritis, unspecified site: Secondary | ICD-10-CM

## 2017-11-21 DIAGNOSIS — Z79899 Other long term (current) drug therapy: Secondary | ICD-10-CM

## 2017-11-21 DIAGNOSIS — E538 Deficiency of other specified B group vitamins: Secondary | ICD-10-CM | POA: Diagnosis not present

## 2017-11-21 DIAGNOSIS — R6889 Other general symptoms and signs: Secondary | ICD-10-CM

## 2017-11-21 DIAGNOSIS — Z0000 Encounter for general adult medical examination without abnormal findings: Secondary | ICD-10-CM

## 2017-11-21 DIAGNOSIS — I10 Essential (primary) hypertension: Secondary | ICD-10-CM | POA: Diagnosis not present

## 2017-11-21 DIAGNOSIS — K219 Gastro-esophageal reflux disease without esophagitis: Secondary | ICD-10-CM

## 2017-11-21 NOTE — Patient Instructions (Signed)
Getting fluids in is one of the most important things for patients with dementia; it's helpful to try to give water, fruit juice or tea in addition to sodas if possible. Try to give in very small quantities as she tolerates -    Dementia Caregiver Guide Dementia is a term used to describe a number of symptoms that affect memory and thinking. The most common symptoms include:  Memory loss.  Trouble with language and communication.  Trouble concentrating.  Poor judgment.  Problems with reasoning.  Child-like behavior and language.  Extreme anxiety.  Angry outbursts.  Wandering from home or public places.  Dementia usually gets worse slowly over time. In the early stages, people with dementia can stay independent and safe with some help. In later stages, they need help with daily tasks such as dressing, grooming, and using the bathroom. How to help the person with dementia cope Dementia can be frightening and confusing. Here are some tips to help the person with dementia cope with changes caused by the disease. General tips  Keep the person on track with his or her routine.  Try to identify areas where the person may need help.  Be supportive, patient, calm, and encouraging.  Gently remind the person that adjusting to changes takes time.  Help with the tasks that the person has asked for help with.  Keep the person involved in daily tasks and decisions as much as possible.  Encourage conversation, but try not to get frustrated or harried if the person struggles to find words or does not seem to appreciate your help. Communication tips  When the person is talking or seems frustrated, make eye contact and hold the person's hand.  Ask specific questions that need yes or no answers.  Use simple words, short sentences, and a calm voice. Only give one direction at a time.  When offering choices, limit them to just 1 or 2.  Avoid correcting the person in a negative  way.  If the person is struggling to find the right words, gently try to help him or her. How to recognize symptoms of stress Symptoms of stress in caregivers include:  Feeling frustrated or angry with the person with dementia.  Denying that the person has dementia or that his or her symptoms will not improve.  Feeling hopeless and unappreciated.  Difficulty sleeping.  Difficulty concentrating.  Feeling anxious, irritable, or depressed.  Developing stress-related health problems.  Feeling like you have too little time for your own life.  Follow these instructions at home:  Make sure that you and the person you are caring for: ? Get regular sleep. ? Exercise regularly. ? Eat regular, nutritious meals. ? Drink enough fluid to keep your urine clear or pale yellow. ? Take over-the-counter and prescription medicines only as told by your health care providers. ? Attend all scheduled health care appointments.  Join a support group with others who are caregivers.  Ask about respite care resources so that you can have a regular break from the stress of caregiving.  Look for signs of stress in yourself and in the person you are caring for. If you notice signs of stress, take steps to manage it.  Consider any safety risks and take steps to avoid them.  Organize medications in a pill box for each day of the week.  Create a plan to handle any legal or financial matters. Get legal or financial advice if needed.  Keep a calendar in a central location to remind  the person of appointments or other activities. Tips for reducing the risk of injury  Keep floors clear of clutter. Remove rugs, magazine racks, and floor lamps.  Keep hallways well lit, especially at night.  Put a handrail and nonslip mat in the bathtub or shower.  Put childproof locks on cabinets that contain dangerous items, such as medicines, alcohol, guns, toxic cleaning items, sharp tools or utensils, matches, and  lighters.  Put the locks in places where the person cannot see or reach them easily. This will help ensure that the person does not wander out of the house and get lost.  Be prepared for emergencies. Keep a list of emergency phone numbers and addresses in a convenient area.  Remove car keys and lock garage doors so that the person does not try to get in the car and drive.  Have the person wear a bracelet that tracks locations and identifies the person as having memory problems. This should be worn at all times for safety. Where to find support: Many individuals and organizations offer support. These include:  Support groups for people with dementia and for caregivers.  Counselors or therapists.  Home health care services.  Adult day care centers.  Where to find more information: Alzheimer's Association: [employer] Contact a health care provider if:  The person's health is rapidly getting worse.  You are no longer able to care for the person.  Caring for the person is affecting your physical and emotional health.  The person threatens himself or herself, you, or anyone else. Summary  Dementia is a term used to describe a number of symptoms that affect memory and thinking.  Dementia usually gets worse slowly over time.  Take steps to reduce the person's risk of injury, and to plan for future care.  Caregivers need support, relief from caregiving, and time for their own lives. This information is not intended to replace advice given to you by your health care provider. Make sure you discuss any questions you have with your health care provider. Document Released: 06/13/2016 Document Revised: 06/13/2016 Document Reviewed: 06/13/2016 Elsevier Interactive Patient Education  [employer].

## 2017-11-21 NOTE — Telephone Encounter (Signed)
Palliative Care SW left a vm with patient's husband, Gwyndolyn Saxon, to set up a home visit next week.

## 2017-11-22 LAB — COMPLETE METABOLIC PANEL WITH GFR
AG RATIO: 1.8 (calc) (ref 1.0–2.5)
ALKALINE PHOSPHATASE (APISO): 67 U/L (ref 33–130)
ALT: 7 U/L (ref 6–29)
AST: 11 U/L (ref 10–35)
Albumin: 4.2 g/dL (ref 3.6–5.1)
BUN/Creatinine Ratio: 32 (calc) — ABNORMAL HIGH (ref 6–22)
BUN: 29 mg/dL — ABNORMAL HIGH (ref 7–25)
CO2: 25 mmol/L (ref 20–32)
Calcium: 9 mg/dL (ref 8.6–10.4)
Chloride: 105 mmol/L (ref 98–110)
Creat: 0.91 mg/dL (ref 0.50–0.99)
GFR, Est African American: 78 mL/min/{1.73_m2} (ref >=60)
GFR, Est Non African American: 68 mL/min/{1.73_m2} (ref >=60)
GLOBULIN: 2.4 g/dL (ref 1.9–3.7)
Glucose, Bld: 100 mg/dL — ABNORMAL HIGH (ref 65–99)
POTASSIUM: 4 mmol/L (ref 3.5–5.3)
SODIUM: 144 mmol/L (ref 135–146)
Total Bilirubin: 0.4 mg/dL (ref 0.2–1.2)
Total Protein: 6.6 g/dL (ref 6.1–8.1)

## 2017-11-22 LAB — VALPROIC ACID LEVEL: Valproic Acid Lvl: 65.6 mg/L (ref 50.0–100.0)

## 2017-12-06 ENCOUNTER — Telehealth: Payer: Self-pay | Admitting: Licensed Clinical Social Worker

## 2017-12-06 NOTE — Telephone Encounter (Signed)
Palliative Care SW spoke with patient's husband, Gwyndolyn Saxon.  Home visit is scheduled for May 21st, 2019, at 11:30am.

## 2017-12-11 ENCOUNTER — Other Ambulatory Visit: Payer: Self-pay | Admitting: Licensed Clinical Social Worker

## 2017-12-11 ENCOUNTER — Encounter: Payer: Self-pay | Admitting: *Deleted

## 2017-12-11 ENCOUNTER — Other Ambulatory Visit: Payer: Self-pay | Admitting: *Deleted

## 2017-12-11 DIAGNOSIS — Z515 Encounter for palliative care: Secondary | ICD-10-CM

## 2017-12-11 NOTE — Progress Notes (Signed)
COMMUNITY PALLIATIVE CARE RN NOTE  PATIENT NAME: Helen Cabrera DOB: 04-28-1956 MRN: [id number]  PRIMARY CARE PROVIDER: [name], MD  RESPONSIBLE PARTY:  Acct ID - Guarantor Home Phone Work Phone Relationship Acct Type  [id number] Garnette [phone]  Self P/F     [address], [place], [place] [address]    PLAN OF CARE and INTERVENTION:  1. ADVANCE CARE PLANNING/GOALS OF CARE: Avoid hospitalizations, husband just wants patient to be comfortable and continue to have and do what makes her happy 2. PATIENT/CAREGIVER EDUCATION: Explained Palliative Care Services, Reinforced Safety/Fall Precautions 3. DISEASE STATUS: Joint visit made with Palliative Care SW. Patient sitting up at dining table being fed lunch by her husband. Appears comfortable without any physical indicators of pain. No evidence of dyspnea. Husband states that patient is eating well, mainly 2 meals/day. Denies dysphagia. Good fluid intake as well. Mainly takes liquid medications. Other medications patient will chew it and husband will have patient drink some fluids afterwards. Weight stable. No recent wt loss. Husband feels current medication regimen is effective. Patient likes to walk. Requires 1 person assistance to a standing position. Ambulates in a circle mainly with a shuffled gait. No assistive devices used, however family stands by. Patient is prone to falling. No recent falls. Also requires 1 person assistance with showers. Has L sided weakness with slight contracture of L hand/wrist.  Incontinent of bowel and bladder. Wears Depends. LBM yesterday (12/10/17). Usually has BMs daily about 30 minutes after she eats. Must be fed most of the time, but can eat some finger foods independently. No recent episodes of seizures. Continues on Depakote. Patient slightly agitated when obtaining vitals, with occasional jerking movements, but husband was able to calm patient enough to allow vitals. Patient is unable to engage in any  meaningful conversation. Unable to follow commands. No tearfulness noted during visit. Patient is no longer taking Nuedexta. Blind in R eye. Husband does not feel that patient requires monthly visits by Palliative Care as he feels that patient is doing well at this time.    HISTORY OF PRESENT ILLNESS:  This is a 62 yo female who lives at home with her husband. Follow up visit made to assess patient's overall condition and assist with any symptom management needs. Condition stable at this time.   CODE STATUS: DNR ADVANCED DIRECTIVES: Y MOST FORM: no PPS: 30%   PHYSICAL EXAM:   VITALS: Today's Vitals   12/11/17 1207  BP: 120/64  Pulse: 66  Resp: 16  SpO2: 98%  PainSc: 0-No pain    LUNGS: clear to auscultation  CARDIAC: Cor RRR EXTREMITIES: No edema SKIN: Small bruise noted to L forearm  NEURO: Awake and alert, only spoke 1-2 words, shuffled gait, unable to follow commands   (Duration of visit and documentation 75 minutes)    Daryl Eastern, RN, BSN

## 2017-12-11 NOTE — Progress Notes (Signed)
COMMUNITY PALLIATIVE CARE SW NOTE  PATIENT NAME: Helen Cabrera DOB: 06-24-1956 MRN: [id number]  PRIMARY CARE PROVIDER: [name], MD  RESPONSIBLE PARTY:  Acct ID - Guarantor Home Phone Work Phone Relationship Acct Type  [id number] Helen [phone]  Self P/F     [address], [place], [place] [phone]     PLAN OF CARE and INTERVENTIONS:             1. GOALS OF CARE/ ADVANCE CARE PLANNING:  For patient to remain in her home. 2. SOCIAL/EMOTIONAL/SPIRITUAL ASSESSMENT/ INTERVENTIONS:  Patient is a 62 y/o married caucasian female who lives with her husband, Helen Cabrera (Helen Cabrera) and their grandson.  They have been married for 33 years.  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  She is currently on Disability due to her illness.  Helen Cabrera reports that patient took care of him for 33 years and he is going to care for her.  He was observed feeding her breakfast.  He said she is prone to falls, but has not had any recently.  She did not display any nonverbal indicators of pain. 3. PATIENT/CAREGIVER EDUCATION/ COPING:  Provided education regarding the Palliative Care Program.  Her husband stated patient is stable and he does not wish for another visit for six months.  Patient made a nonsensical sound and appeared agitated when her blood pressure was taken. 4. PERSONAL EMERGENCY PLAN:  Husband calls EMS for assistance. 5. COMMUNITY RESOURCES COORDINATION/ HEALTH CARE NAVIGATION:  None per husband. 6. FINANCIAL/LEGAL CONCERNS/INTERVENTIONS:  None per husband.     SOCIAL HX:  Social History   Tobacco Use  . Smoking status: Never Smoker  . Smokeless tobacco: Never Used  Substance Use Topics  . Alcohol use: Yes    Alcohol/week: 0.0 oz    Comment: occ- very rarely    CODE STATUS: DNR ADVANCED DIRECTIVES: Yes MOST FORM COMPLETE:  Yes HOSPICE EDUCATION PROVIDED: Not during visit.  PPS:  Patient's intake is normal.  Husband feeds her, unless it is finger food in which she  can feed herself.  She can walk independently and appears to shuffle.  Patient's husband had to help her to a standing position. Duration of visit and documentation:  60 minutes.      Creola Corn Juda Lajeunesse, LCSW

## 2017-12-21 ENCOUNTER — Encounter (HOSPITAL_COMMUNITY): Payer: Self-pay

## 2017-12-21 ENCOUNTER — Emergency Department (HOSPITAL_COMMUNITY): Payer: Medicare Other

## 2017-12-21 ENCOUNTER — Emergency Department (HOSPITAL_COMMUNITY)
Admission: EM | Admit: 2017-12-21 | Discharge: 2017-12-22 | Disposition: A | Discharge status: Home / Self Care | Payer: Medicare Other | Attending: Emergency Medicine | Admitting: Emergency Medicine

## 2017-12-21 DIAGNOSIS — Z79899 Other long term (current) drug therapy: Secondary | ICD-10-CM | POA: Diagnosis not present

## 2017-12-21 DIAGNOSIS — N3001 Acute cystitis with hematuria: Secondary | ICD-10-CM | POA: Diagnosis not present

## 2017-12-21 DIAGNOSIS — Y939 Activity, unspecified: Secondary | ICD-10-CM | POA: Diagnosis not present

## 2017-12-21 DIAGNOSIS — S0083XA Contusion of other part of head, initial encounter: Secondary | ICD-10-CM | POA: Insufficient documentation

## 2017-12-21 DIAGNOSIS — F0390 Unspecified dementia without behavioral disturbance: Secondary | ICD-10-CM | POA: Diagnosis not present

## 2017-12-21 DIAGNOSIS — S0990XA Unspecified injury of head, initial encounter: Secondary | ICD-10-CM | POA: Diagnosis not present

## 2017-12-21 DIAGNOSIS — W19XXXA Unspecified fall, initial encounter: Secondary | ICD-10-CM | POA: Insufficient documentation

## 2017-12-21 DIAGNOSIS — S59902A Unspecified injury of left elbow, initial encounter: Secondary | ICD-10-CM | POA: Diagnosis not present

## 2017-12-21 DIAGNOSIS — I10 Essential (primary) hypertension: Secondary | ICD-10-CM | POA: Insufficient documentation

## 2017-12-21 DIAGNOSIS — Y998 Other external cause status: Secondary | ICD-10-CM | POA: Insufficient documentation

## 2017-12-21 DIAGNOSIS — Y92018 Other place in single-family (private) house as the place of occurrence of the external cause: Secondary | ICD-10-CM | POA: Insufficient documentation

## 2017-12-21 DIAGNOSIS — S41102A Unspecified open wound of left upper arm, initial encounter: Secondary | ICD-10-CM | POA: Diagnosis not present

## 2017-12-21 DIAGNOSIS — Y92009 Unspecified place in unspecified non-institutional (private) residence as the place of occurrence of the external cause: Secondary | ICD-10-CM

## 2017-12-21 DIAGNOSIS — M25522 Pain in left elbow: Secondary | ICD-10-CM | POA: Diagnosis not present

## 2017-12-21 LAB — CBC WITH DIFFERENTIAL/PLATELET
Abs Immature Granulocytes: 0 10*3/uL (ref 0.0–0.1)
Basophils Absolute: 0 10*3/uL (ref 0.0–0.1)
Basophils Relative: 1 %
Eosinophils Absolute: 0.2 10*3/uL (ref 0.0–0.7)
Eosinophils Relative: 2 %
HCT: 39.1 % (ref 36.0–46.0)
Hemoglobin: 12.5 g/dL (ref 12.0–15.0)
Immature Granulocytes: 0 %
Lymphocytes Relative: 28 %
Lymphs Abs: 2.5 10*3/uL (ref 0.7–4.0)
MCH: 30.6 pg (ref 26.0–34.0)
MCHC: 32 g/dL (ref 30.0–36.0)
MCV: 95.6 fL (ref 78.0–100.0)
Monocytes Absolute: 0.9 10*3/uL (ref 0.1–1.0)
Monocytes Relative: 10 %
Neutro Abs: 5.2 10*3/uL (ref 1.7–7.7)
Neutrophils Relative %: 59 %
Platelets: 217 10*3/uL (ref 150–400)
RBC: 4.09 MIL/uL (ref 3.87–5.11)
RDW: 13.8 % (ref 11.5–15.5)
WBC: 8.8 10*3/uL (ref 4.0–10.5)

## 2017-12-21 LAB — BASIC METABOLIC PANEL
Anion gap: 10 (ref 5–15)
BUN: 32 mg/dL — ABNORMAL HIGH (ref 6–20)
CO2: 25 mmol/L (ref 22–32)
Calcium: 8.7 mg/dL — ABNORMAL LOW (ref 8.9–10.3)
Chloride: 108 mmol/L (ref 101–111)
Creatinine, Ser: 0.88 mg/dL (ref 0.44–1.00)
GFR calc Af Amer: >60 mL/min (ref >60)
GFR calc non Af Amer: >60 mL/min (ref >60)
Glucose, Bld: 95 mg/dL (ref 65–99)
Potassium: 4.7 mmol/L (ref 3.5–5.1)
Sodium: 143 mmol/L (ref 135–145)

## 2017-12-21 LAB — VALPROIC ACID LEVEL: Valproic Acid Lvl: 47 ug/mL — ABNORMAL LOW (ref 50.0–100.0)

## 2017-12-21 NOTE — ED Provider Notes (Signed)
Patient placed in Quick Look pathway, seen and evaluated   Chief Complaint: Fall, possible seizure  HPI:   Patient with history of advanced dementia presents accompanied by husband for evaluation after fall.  Per the patient's husband at around 5 PM this evening he turned to find his wife facedown on the hardwood floor in the home.  He states that her extremities were rigid, her eyes were rolled to the back of her head.  He states this is somewhat similar to prior seizure-like episodes she has had.  He is unsure if she had any incontinence at the time as she is incontinent at baseline.  He did note a small abrasion to the left side of the lower lip.  He states that within less than 5 minutes she was back to her baseline and did not have a definite postictal period.  She is on Depakote and has been compliant with her medications.  No recent illnesses, fever, or cough.  He is unsure if her tetanus is up-to-date.  States the last time she had a seizure he was told she had a UTI.  ROS: Positive for syncope, possible seizure, wound negative for fever, cough  Physical Exam:   Gen: No distress  Neuro: Awake and Alert  Skin: Warm, [place] skin tear to the left elbow.  Bleeding controlled.    Focused Exam: Patient is alert, nonverbal, occasionally will yell out.  Moving extremities spontaneously with good strength.  Does not generally follow commands.  Patient's husband states that this is her mental status baseline.  5 x 6 cm hematoma to the left forehead, tender to palpation.  No underlying crepitus noted.  Unable to visualize TMs to assess for hemotympanum secondary to cerumen impaction.  Superficial skin abrasion to the left lower lip.  Lungs clear to auscultation bilaterally, heart RRR.  2+ radial and DP/PT pulses bilaterally.    Initiation of care has begun. The patient has been counseled on the process, plan, and necessity for staying for the completion/evaluation, and the remainder of the  medical screening examination    Renita Papa, PA-C 12/22/17 1204    Lajean Saver, MD 12/22/17 1528

## 2017-12-21 NOTE — ED Notes (Signed)
Unable teo obtain bp and temp from pt. Due to altered mental status.

## 2017-12-21 NOTE — ED Triage Notes (Signed)
Per pt's husband, pt fell face first (unwitnessed), hit head; large lump on left forehead, skin tear on left elbow; hx of seizures; pt's husband says pt was "stretched out and stiff, eyes rolled back in her head"; Hx of dementia and parkinson's; pt's husbands states that she is not on blood thinners

## 2017-12-22 DIAGNOSIS — S0083XA Contusion of other part of head, initial encounter: Secondary | ICD-10-CM | POA: Diagnosis not present

## 2017-12-22 LAB — URINALYSIS, ROUTINE W REFLEX MICROSCOPIC
Bilirubin Urine: NEGATIVE
Glucose, UA: NEGATIVE mg/dL
Ketones, ur: NEGATIVE mg/dL
Nitrite: POSITIVE — AB
Protein, ur: 30 mg/dL — AB
Specific Gravity, Urine: 1.021 (ref 1.005–1.030)
WBC, UA: >50 WBC/hpf — ABNORMAL HIGH (ref 0–5)
pH: 6 (ref 5.0–8.0)

## 2017-12-22 MED ORDER — CEPHALEXIN 250 MG/5ML PO SUSR
500.0000 mg | Freq: Once | ORAL | Status: AC
  Administered 2017-12-22: 500 mg via ORAL
  Filled 2017-12-22: qty 10

## 2017-12-22 MED ORDER — CEPHALEXIN 250 MG/5ML PO SUSR: 500.0000 mg | Freq: Three times a day (TID) | ORAL | 0 refills | Status: AC

## 2017-12-22 NOTE — Discharge Instructions (Signed)
Give her the antibiotic 3 times a day for 7 days, she got the first dose in the ED tonight. Have her rechecked if she gets a fever, vomiting or seems worse.   Let Dr Aquino's office know about her ED visit tonight on Monday, June 3.

## 2017-12-22 NOTE — ED Provider Notes (Signed)
[facility] EMERGENCY DEPARTMENT Provider Note   CSN: [id number] Arrival date & time: 12/21/17  1733  Time seen 23:22 PM    History   Chief Complaint Chief Complaint  Patient presents with  . Fall   Level 5 caveat for dementia  HPI Helen Cabrera is a 62 y.o. female.  HPI husband states patient was in the living room and he was leaving the living room walking down the hall to go to the kitchen when he heard her fall.  When he went back to the room she was and she was laying face down on the floor.  He did not see any seizure activity but states she was very stiff.  He states it took about 5 minutes before she started to respond to him.  After that period of time she was able to help him help her get up and into a chair.  He states patient wants to walk and she is very unstable.  He also describes how she can get caught behind the TV and cannot get herself out.  He states she seems back to her usual self now.  He states she had her first seizure about 3 years ago and then had a second 1 a few months later.  The last time was a few months ago when she had a UTI.  He states she wears depends.  He states she has a bruise on her head and a skin tear on her left elbow.  He believes her immunizations are up-to-date.  PCP [name], MD Neurology Dr Delice Lesch  Past Medical History:  Diagnosis Date  . Anemia    Iron def   . Bacterial overgrowth syndrome   . Chronic headaches   . Dementia   . Depression   . Family history of malignant neoplasm of gastrointestinal tract   . Headache(784.0)   . Hypercholesterolemia   . Hypertension   . IBS (irritable bowel syndrome)   . Osteoarthritis   . Pancreatitis   . Vitamin B12 deficiency     Patient Active Problem List   Diagnosis Date Noted  . Corticobasal degeneration 02/27/2017  . Epilepsy, generalized, convulsive ([place]) 02/27/2017  . Left arm weakness 03/02/2016  . Vitamin D deficiency 10/09/2013  . Medication  management 10/09/2013  . Vitamin B 12 deficiency 10/11/2009  . Hyperlipidemia 10/11/2009  . Essential hypertension 10/11/2009  . GERD 10/11/2009  . Diverticulosis of colon 10/11/2009  . Osteoarthritis 10/11/2009    Past Surgical History:  Procedure Laterality Date  . ABDOMINAL HYSTERECTOMY    . APPENDECTOMY    . CESAREAN SECTION     x 4  . CESAREAN SECTION     x4  . PARS PLANA VITRECTOMY Right 04/02/2014   Procedure: RIGHT PARS PLANA VITRECTOMY WITH 25 GAUGE/ENDO LASER/LENSECTOMY/INSERTION OF SILICONE OIL,REPAIR COMPLEX RETINAL DETACHMENT,MEMBRANE PEEL, IRIDECTOMY;  Surgeon: Hurman Horn, MD;  Location: [facility];  Service: Ophthalmology;  Laterality: Right;     OB History   None      Home Medications    Prior to Admission medications   Medication Sig Start Date End Date Taking? Authorizing Provider  atenolol (TENORMIN) 100 MG tablet TAKE 1 TABLET EVERY DAY FOR BLOOD PRESSURE Patient taking differently: Take 50 mg by mouth at bedtime.  10/03/17  Yes [name], MD  guaiFENesin (MUCINEX) 600 MG 12 hr tablet Take 600 mg by mouth daily as needed for cough or to loosen phlegm.    Yes [provider]  polyethylene glycol (MIRALAX / GLYCOLAX) packet Take 17 g by mouth daily. Patient taking differently: Take 17 g by mouth daily as needed for mild constipation.  05/03/16  Yes Vicie Mutters, PA-C  Valproate Sodium (DEPAKENE) 250 MG/5ML SOLN solution Take 42mL in AM, 57mL in PM Patient taking differently: Take 250-500 mg by mouth See admin instructions. Take 46mL in the morning and take 20mL in the evening 10/16/17  Yes Cameron Sprang, MD  vitamin B-12 (CYANOCOBALAMIN) 100 MCG tablet Take 100 mcg by mouth daily.   Yes [provider]  Vitamin D, Ergocalciferol, (DRISDOL) 50000 units CAPS capsule TAKE 1 CAPSULE DAILY OR AS DIRECTED FOR SEVERE VIT D DEFICIENCY 05/07/17  Yes Vicie Mutters, PA-C  cephALEXin (KEFLEX) 250 MG/5ML suspension Take 10 mLs (500 mg total) by  mouth 3 (three) times daily for 7 days. 12/22/17 12/29/17  Rolland Porter, MD    Family History Family History  Problem Relation Age of Onset  . Diabetes Mother   . Alcohol abuse Father   . Cirrhosis Father   . Breast cancer Maternal Aunt   . Colon cancer Maternal Aunt   . Stomach cancer Brother   . Esophageal cancer Brother   . Diabetes Brother   . Diabetes Maternal Uncle   . Heart disease Unknown        grandfather/grandmother    Social History Social History   Tobacco Use  . Smoking status: Never Smoker  . Smokeless tobacco: Never Used  Substance Use Topics  . Alcohol use: Yes    Alcohol/week: 0.0 oz    Comment: occ- very rarely  . Drug use: No  lives at home Lives with spouse   Allergies   Amitriptyline; Carafate [sucralfate]; Amitriptyline; Carafate [sucralfate]; Codeine; Lexapro [escitalopram oxalate]; Paxil [paroxetine hcl]; Prednisone; Promethazine; Zoloft [sertraline hcl]; Codeine; Lexapro [escitalopram oxalate]; Paxil [paroxetine hcl]; Prednisone; Promethazine hcl; and Zoloft [sertraline hcl]   Review of Systems Review of Systems  All other systems reviewed and are negative.    Physical Exam Updated Vital Signs BP 133/73   Pulse 80   Temp 98.6 F (37 C)   Resp 20   Ht 5\' 2"  (1.575 m)   Wt 52.2 kg (115 lb)   SpO2 100%   BMI 21.03 kg/m   Vital signs normal    Physical Exam  Constitutional: She appears well-developed and well-nourished.  Non-toxic appearance. She does not appear ill. No distress.  HENT:  Head: Normocephalic.  Right Ear: External ear normal.  Left Ear: External ear normal.  Nose: Nose normal. No mucosal edema or rhinorrhea.  Mouth/Throat: Mucous membranes are normal. No dental abscesses or uvula swelling.  Patient will not open her mouth, patient has a large bruise on her left forehead  Eyes: Pupils are equal, round, and reactive to light. Conjunctivae and EOM are normal.  Patient refuses to open her eyes, when I tried to open I am  only barely able to see her pupils which are normal size  Neck: Normal range of motion and full passive range of motion without pain. Neck supple.  Cardiovascular: Normal rate, regular rhythm and normal heart sounds. Exam reveals no gallop and no friction rub.  No murmur heard. Pulmonary/Chest: Effort normal and breath sounds normal. No respiratory distress. She has no wheezes. She has no rhonchi. She has no rales. She exhibits no tenderness and no crepitus.  Abdominal: Soft. Normal appearance and bowel sounds are normal. She exhibits no distension. There is no tenderness. There is no rebound and  no guarding.  Musculoskeletal: Normal range of motion. She exhibits no edema or tenderness.  Moves all extremities well.   Neurological: No cranial nerve deficit.  Patient is lying on the stretcher with her eyes closed however she does move around what her husband talks  Skin: Skin is warm, dry and intact. No rash noted. No erythema. No pallor.  Psychiatric: She is slowed. She is noncommunicative.  Nursing note and vitals reviewed.      ED Treatments / Results  Labs (all labs ordered are listed, but only abnormal results are displayed) Results for orders placed or performed during the hospital encounter of 12/21/17  Valproic acid level  Result Value Ref Range   Valproic Acid Lvl 47 (L) 50.0 - 100.0 ug/mL  Basic metabolic panel  Result Value Ref Range   Sodium 143 135 - 145 mmol/L   Potassium 4.7 3.5 - 5.1 mmol/L   Chloride 108 101 - 111 mmol/L   CO2 25 22 - 32 mmol/L   Glucose, Bld 95 65 - 99 mg/dL   BUN 32 (H) 6 - 20 mg/dL   Creatinine, Ser 0.88 0.44 - 1.00 mg/dL   Calcium 8.7 (L) 8.9 - 10.3 mg/dL   GFR calc non Af Amer >60 >60 mL/min   GFR calc Af Amer >60 >60 mL/min   Anion gap 10 5 - 15  CBC with Differential  Result Value Ref Range   WBC 8.8 4.0 - 10.5 K/uL   RBC 4.09 3.87 - 5.11 MIL/uL   Hemoglobin 12.5 12.0 - 15.0 g/dL   HCT 39.1 36.0 - 46.0 %   MCV 95.6 78.0 - 100.0 fL    MCH 30.6 26.0 - 34.0 pg   MCHC 32.0 30.0 - 36.0 g/dL   RDW 13.8 11.5 - 15.5 %   Platelets 217 150 - 400 K/uL   Neutrophils Relative % 59 %   Neutro Abs 5.2 1.7 - 7.7 K/uL   Lymphocytes Relative 28 %   Lymphs Abs 2.5 0.7 - 4.0 K/uL   Monocytes Relative 10 %   Monocytes Absolute 0.9 0.1 - 1.0 K/uL   Eosinophils Relative 2 %   Eosinophils Absolute 0.2 0.0 - 0.7 K/uL   Basophils Relative 1 %   Basophils Absolute 0.0 0.0 - 0.1 K/uL   Immature Granulocytes 0 %   Abs Immature Granulocytes 0.0 0.0 - 0.1 K/uL  Urinalysis, Routine w reflex microscopic  Result Value Ref Range   Color, Urine YELLOW YELLOW   APPearance CLOUDY (A) CLEAR   Specific Gravity, Urine 1.021 1.005 - 1.030   pH 6.0 5.0 - 8.0   Glucose, UA NEGATIVE NEGATIVE mg/dL   Hgb urine dipstick SMALL (A) NEGATIVE   Bilirubin Urine NEGATIVE NEGATIVE   Ketones, ur NEGATIVE NEGATIVE mg/dL   Protein, ur 30 (A) NEGATIVE mg/dL   Nitrite POSITIVE (A) NEGATIVE   Leukocytes, UA LARGE (A) NEGATIVE   RBC / HPF 11-20 0 - 5 RBC/hpf   WBC, UA >50 (H) 0 - 5 WBC/hpf   Bacteria, UA MANY (A) NONE SEEN   Laboratory interpretation all normal except mildly subtherapeutic valproic acid level, mildly elevated BUN consistent with dehydration, we discussed that he should mix sports drinks with water so that she does not get dehydrated, he states she refuses to drink water. UTI, urine culture sent    EKG EKG Interpretation  Date/Time:  Friday Dec 21 2017 18:04:54 EDT Ventricular Rate:  78 PR Interval:  134 QRS Duration: 78 QT Interval:  386 QTC  Calculation: 440 R Axis:   58 Text Interpretation:  Normal sinus rhythm Possible Anterior infarct , age undetermined Baseline wander No significant change since last tracing 02 Apr 2014 Confirmed by Rolland Porter [phone]) on 12/21/2017 11:08:55 PM   Radiology Dg Elbow 2 Views Left  Result Date: 12/21/2017 CLINICAL DATA:  Pain after trauma EXAM: LEFT ELBOW - 2 VIEW COMPARISON:  None. FINDINGS: There is no  evidence of fracture, dislocation, or joint effusion. There is no evidence of arthropathy or other focal bone abnormality. Soft tissues are unremarkable. IMPRESSION: Negative. Electronically Signed   By: Dorise Bullion III M.D   On: 12/21/2017 18:28   Ct Head Wo Contrast  Result Date: 12/21/2017 CLINICAL DATA:  Fall, head injury, dementia EXAM: CT HEAD WITHOUT CONTRAST TECHNIQUE: Contiguous axial images were obtained from the base of the skull through the vertex without intravenous contrast. COMPARISON:  10/13/2017 FINDINGS: Motion degraded images. Brain: No evidence of acute infarction, hemorrhage, extra-axial collection or mass lesion/mass effect. Global cortical and central atrophy. Secondary ventriculomegaly. Subcortical white matter and periventricular small vessel ischemic changes. Stable calcified meningioma along the right cerebellar tentorium (series 3/image 9). Vascular: No hyperdense vessel or unexpected calcification. Skull: Normal. Negative for fracture or focal lesion. Sinuses/Orbits: The visualized paranasal sinuses are essentially clear. The mastoid air cells are unopacified. Other: Moderate extracranial hematoma overlying the left frontal bone (series 7/image 14). IMPRESSION: Motion degraded images. Moderate extracranial hematoma overlying the left frontal bone. No evidence of calvarial fracture. No evidence of acute intracranial abnormality. Atrophy with small vessel ischemic changes. Calcified meningioma along the right cerebellar tentorium, unchanged. Electronically Signed   By: Julian Hy M.D.   On: 12/21/2017 18:45    Procedures Procedures (including critical care time)  Medications Ordered in ED Medications  cephALEXin (KEFLEX) 250 MG/5ML suspension 500 mg (has no administration in time range)     Initial Impression / Assessment and Plan / ED Course  I have reviewed the triage vital signs and the nursing notes.  Pertinent labs & imaging results that were available  during my care of the patient were reviewed by me and considered in my medical decision making (see chart for details).     Patient states last time they used the suction catheter to get her urinalysis, this was relayed to nursing staff and that they can also give her something to drink.  Patient does have a urinary tract infection.  Patient can only take liquid medications orally.  I talked to the husband and he feels like he can take her home and give her the oral antibiotics, he does not feel like she needs to be admitted.  Urine culture was sent, he was advised to bring her back if she gets a fever, vomiting or feels worse.  Patient seems more awake and more active.  She still does not make eye contact or have verbal communication.  Final Clinical Impressions(s) / ED Diagnoses   Final diagnoses:  Fall in home, initial encounter  Acute cystitis with hematuria    ED Discharge Orders        Ordered    cephALEXin (KEFLEX) 250 MG/5ML suspension  3 times daily     12/22/17 0323      Plan discharge  Rolland Porter, MD, Barbette Or, MD 12/22/17 [phone]

## 2017-12-24 LAB — URINE CULTURE
Culture: >=100000 — AB
Special Requests: NORMAL

## 2017-12-25 ENCOUNTER — Telehealth: Payer: Self-pay | Admitting: *Deleted

## 2017-12-25 NOTE — Telephone Encounter (Signed)
Post ED Visit - Positive Culture Follow-up  Culture report reviewed by antimicrobial stewardship pharmacist:  []  Elenor Quinones, Pharm.D. []  Heide Guile, Pharm.D., BCPS AQ-ID []  Parks Neptune, Pharm.D., BCPS []  Alycia Rossetti, Pharm.D., BCPS []  [place], Pharm.D., BCPS, AAHIVP []  Legrand Como, Pharm.D., BCPS, AAHIVP []  Salome Arnt, PharmD, BCPS []  Wynell Balloon, PharmD []  Vincenza Hews, PharmD, BCPS Angus Seller, PharmD  Positive urine culture Treated with Cephalexin, organism sensitive to the same and no further patient follow-up is required at this time.  Helen Cabrera 12/25/2017, 10:14 AM

## 2018-02-12 ENCOUNTER — Ambulatory Visit (INDEPENDENT_AMBULATORY_CARE_PROVIDER_SITE_OTHER): Payer: Medicare Other | Admitting: Internal Medicine

## 2018-02-12 ENCOUNTER — Encounter: Payer: Self-pay | Admitting: Internal Medicine

## 2018-02-12 VITALS — BP 149/92 | HR 80 | Temp 97.3°F | Resp 14 | Ht 63.0 in | Wt 122.0 lb

## 2018-02-12 DIAGNOSIS — R7309 Other abnormal glucose: Secondary | ICD-10-CM

## 2018-02-12 DIAGNOSIS — I10 Essential (primary) hypertension: Secondary | ICD-10-CM | POA: Diagnosis not present

## 2018-02-12 DIAGNOSIS — G3185 Corticobasal degeneration: Secondary | ICD-10-CM

## 2018-02-12 DIAGNOSIS — E782 Mixed hyperlipidemia: Secondary | ICD-10-CM

## 2018-02-12 DIAGNOSIS — E559 Vitamin D deficiency, unspecified: Secondary | ICD-10-CM

## 2018-02-12 DIAGNOSIS — Z1212 Encounter for screening for malignant neoplasm of rectum: Secondary | ICD-10-CM

## 2018-02-12 DIAGNOSIS — Z1211 Encounter for screening for malignant neoplasm of colon: Secondary | ICD-10-CM

## 2018-02-12 DIAGNOSIS — F0280 Dementia in other diseases classified elsewhere without behavioral disturbance: Secondary | ICD-10-CM | POA: Diagnosis not present

## 2018-02-12 DIAGNOSIS — Z79899 Other long term (current) drug therapy: Secondary | ICD-10-CM

## 2018-02-12 IMAGING — CR DG SHOULDER 2+V*L*
2 series · 3 of 3 positions shown · non-contrast
Comparison: None.

CLINICAL DATA: Left shoulder pain after fall this evening.

EXAM:
LEFT SHOULDER - 2+ VIEW

[shoulder grashey]
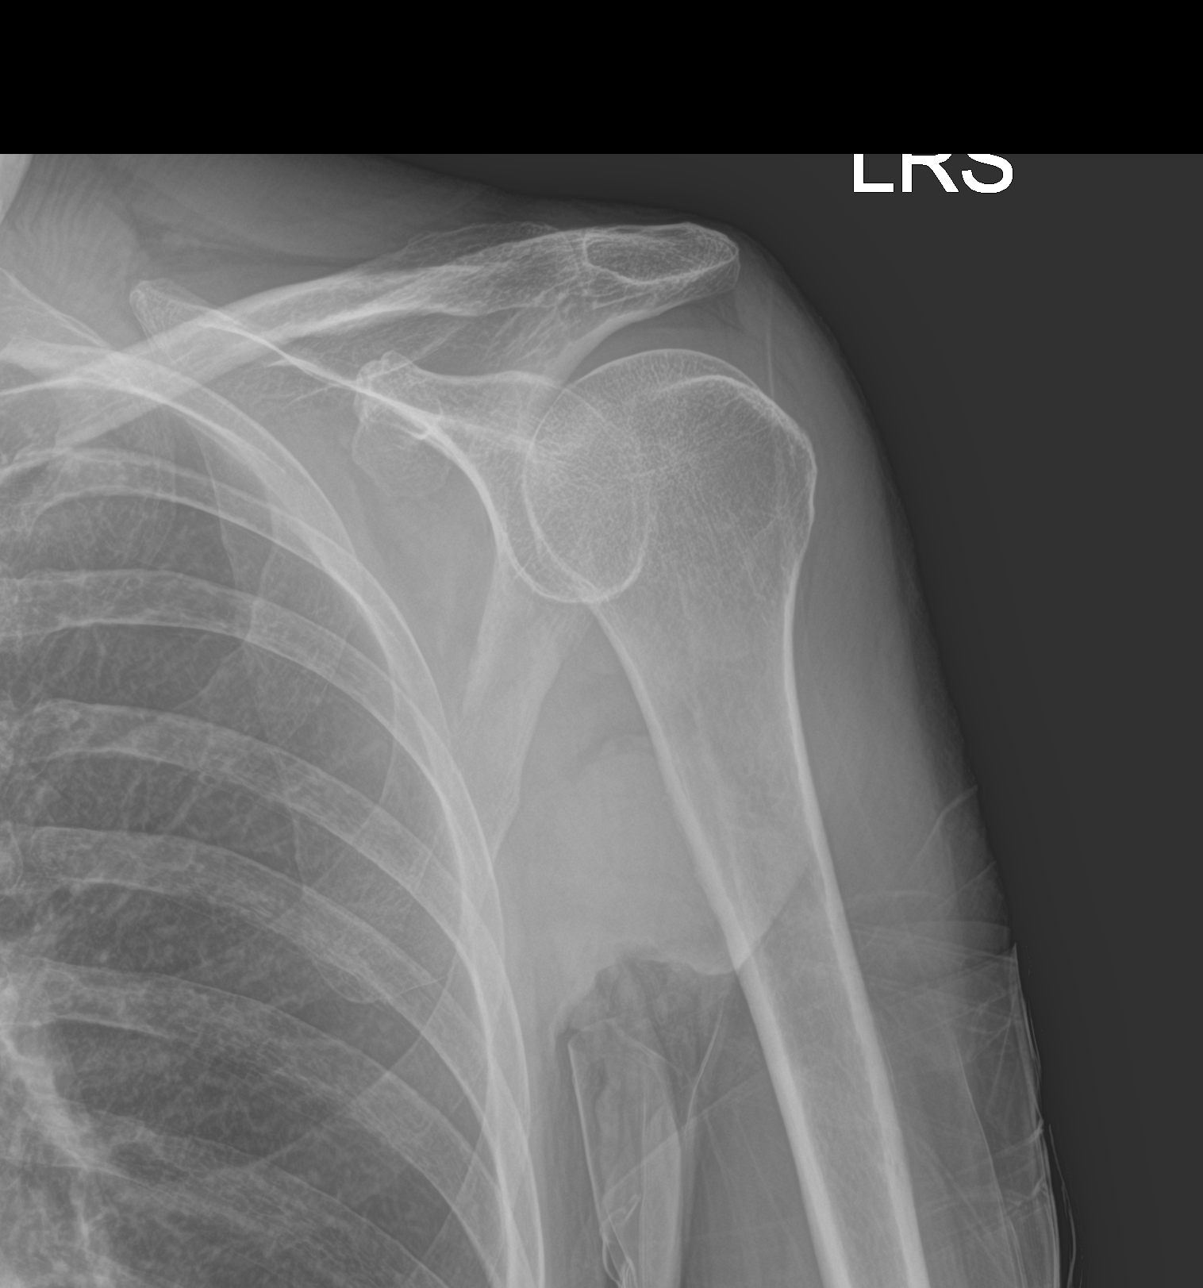

[Series 2: shoulder y view · 0.14mm/px · 2 of 2 slices shown]
[im 1/2]
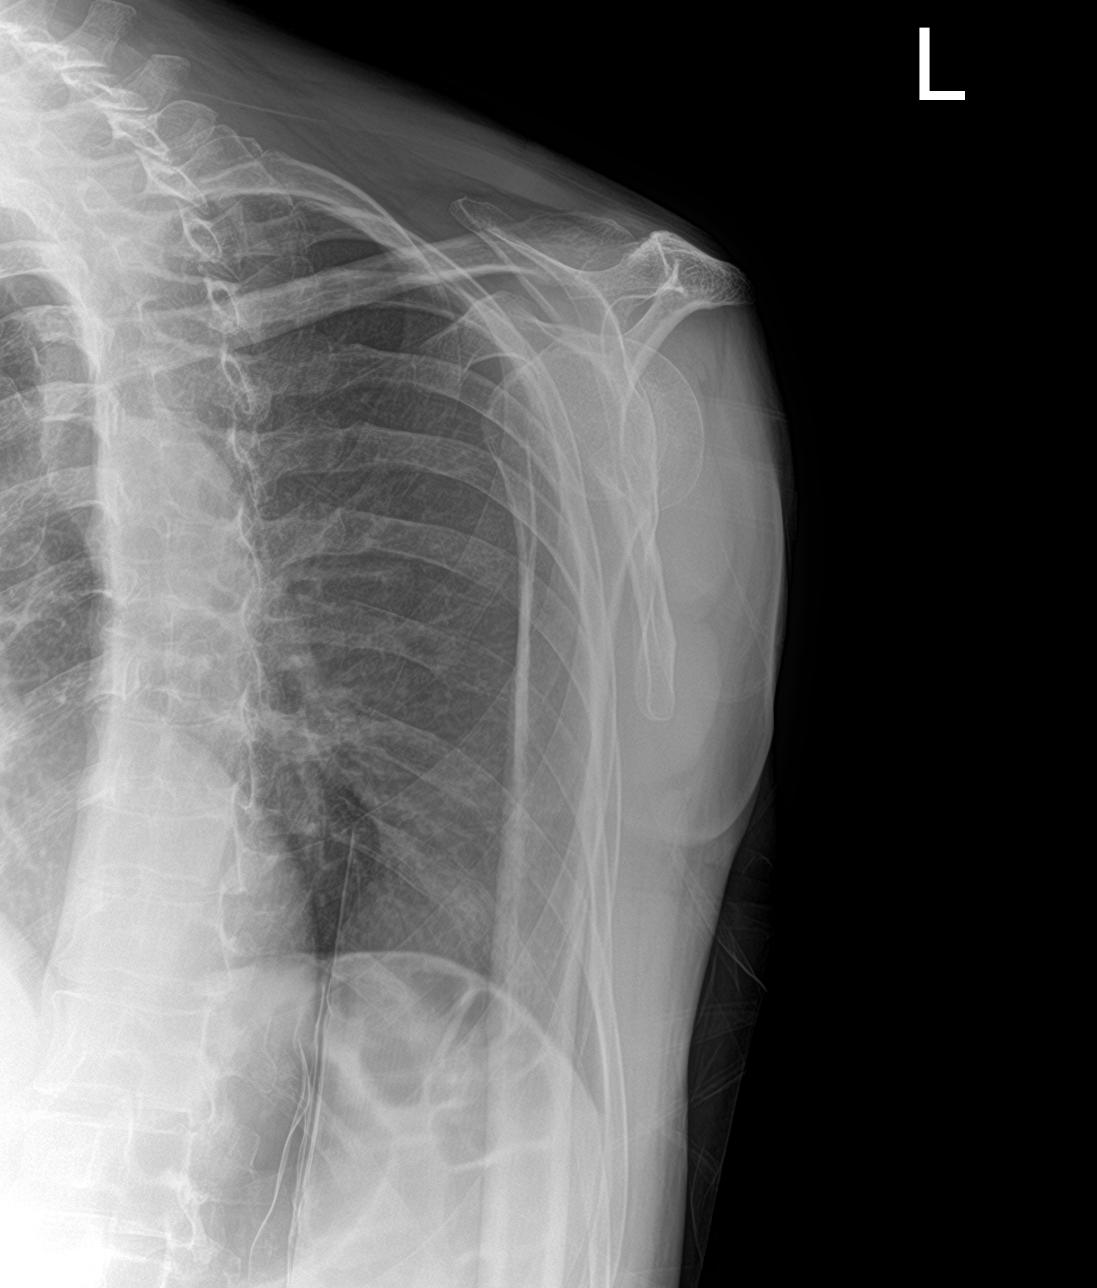
[im 2/2]
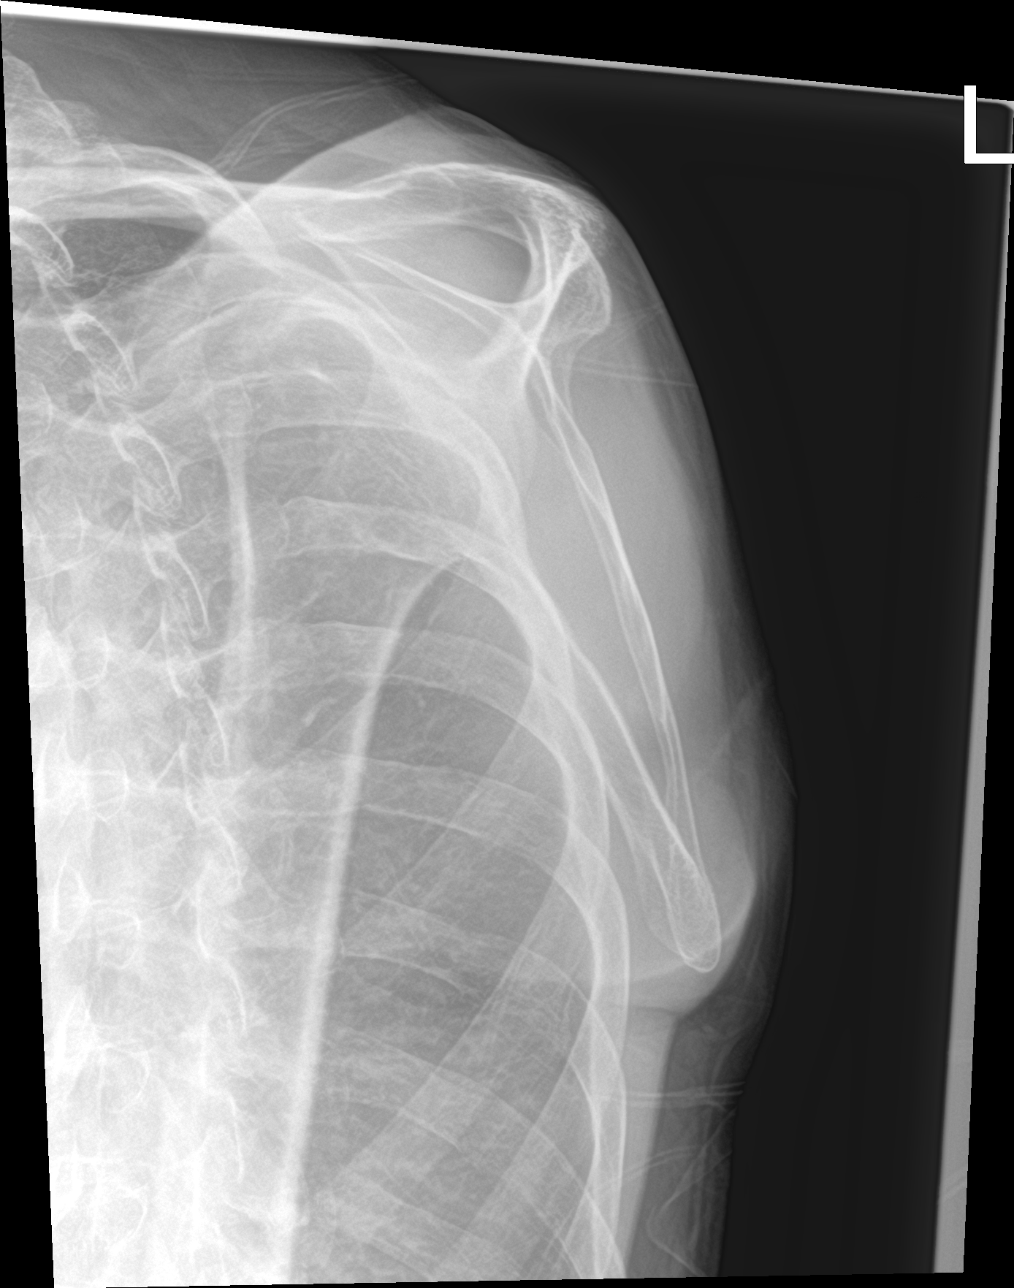

[3 of 3 positions shown; findings below may reference images not displayed]

FINDINGS: There is no evidence of fracture or dislocation. There is no
evidence of arthropathy or other focal bone abnormality. Soft
tissues are unremarkable.
IMPRESSION: Negative.

## 2018-02-12 NOTE — Progress Notes (Signed)
[facility] [name], M.D.     Helen Cabrera. Silverio Lay, P.A.-C Liane Comber, [place] [address] [place], [place] [address] Telephone [phone] Telefax [phone]  Comprehensive Evaluation &  Examination     This very nice 62 y.o. MWF presents for a comprehensive evaluation and management of multiple medical co-morbidities.  Patient has been followed for HTN, HLD, Prediabetes  and Vitamin D Deficiency.     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 Patient also has hx/o recent onset seizures in July 2017. Patient requires maximal assistance in all ADL's provided by her loving husband with minimal assistance from her children. Patient is unable to follow any commands or talk. She requires total assistance feeding & with all ADL's.      Patient has hx/o HTN circa 2008. Patient's BP has been controlled at home and patient has had no reported cardiac symptoms as apparent chest pain, shortness of breath, dizziness or ankle swelling. Today's BP is elevated at 149/92.      Patient's hyperlipidemia is not controlled with diet. Last lipids were not at goal: Lab Results  Component Value Date   CHOL 233 (H) 01/17/2017   HDL 72 01/17/2017   LDLCALC 144 (H) 01/17/2017   TRIG 85 01/17/2017   CHOLHDL 3.2 01/17/2017      Patient has prediabetes  (A1c 6.0% in 2010) and last A1c was at goal: Lab Results  Component Value Date   HGBA1C 5.5 01/17/2017      Finally, patient has history of Vitamin D Deficiency ("15" in 2008) and last Vitamin D was at goal: Lab Results  Component Value Date   VD25OH 74 05/07/2017   Current Outpatient Medications on File Prior to Visit  Medication Sig  . atenolol (TENORMIN) 100 MG tablet TAKE 1 TABLET EVERY DAY FOR BLOOD PRESSURE (Patient taking differently: Take 50 mg by mouth at bedtime. )  . guaiFENesin  (MUCINEX) 600 MG 12 hr tablet Take 600 mg by mouth daily as needed for cough or to loosen phlegm.   . polyethylene glycol (MIRALAX / GLYCOLAX) packet Take 17 g by mouth daily. (Patient taking differently: Take 17 g by mouth daily as needed for mild constipation. )  . Valproate Sodium (DEPAKENE) 250 MG/5ML SOLN solution Take 43mL in AM, 27mL in PM (Patient taking differently: Take 250-500 mg by mouth See admin instructions. Take 66mL in the morning and take 31mL in the evening)   No current facility-administered medications on file prior to visit.    Allergies  Allergen Reactions  . Amitriptyline Other (See Comments)    dysphoria  . Carafate [Sucralfate] Other (See Comments)    constipation  . Amitriptyline Other (See Comments)    dysphoria  . Carafate [Sucralfate] Other (See Comments)    constipation  . Codeine Other (See Comments)    Insomnia; agitation; restlessness   . Lexapro [Escitalopram Oxalate] Other (See Comments)    Insomnia; agitation; restlessness   . Paxil [Paroxetine Hcl] Other (See Comments)    Insomnia; agitation; restlessness   . Prednisone Other (See Comments)    Unknown reaction  . Promethazine Nausea Only  . Zoloft [Sertraline Hcl] Other (See Comments)    Insomnia; agitation; restlessness   . Codeine Other (See Comments)    Insomnia; agitation; restlessness   . Lexapro [Escitalopram Oxalate] Other (See Comments)    Insomnia; agitation; restlessness   . Paxil [Paroxetine  Hcl] Other (See Comments)    Insomnia; agitation; restlessness   . Prednisone Other (See Comments)    unknown  . Promethazine Hcl Nausea Only  . Zoloft [Sertraline Hcl] Other (See Comments)    Insomnia; agitation; restlessness    Past Medical History:  Diagnosis Date  . Anemia    Iron def   . Bacterial overgrowth syndrome   . Chronic headaches   . Dementia   . Depression   . Family history of malignant neoplasm of gastrointestinal tract   . Headache(784.0)   . Hypercholesterolemia    . Hypertension   . IBS (irritable bowel syndrome)   . Osteoarthritis   . Pancreatitis   . Vitamin B12 deficiency    Health Maintenance  Topic Date Due  . INFLUENZA VACCINE  02/21/2018  . COLONOSCOPY  02/27/2022  . TETANUS/TDAP  02/06/2027  . Hepatitis C Screening  Completed  . HIV Screening  Completed  . MAMMOGRAM  Discontinued   Immunization History  Administered Date(s) Administered  . PPD Test 04/16/2014  . Pneumococcal Polysaccharide-23 04/03/2012  . Tdap 04/03/2012, 02/05/2017   Last Colon - 02/28/2012 - Dr Sharlett Iles - Recommended 10 yr f/u Last MGM - 10/23/2013 - patient's dementia precludes her lying still for a MGM. Past Surgical History:  Procedure Laterality Date  . ABDOMINAL HYSTERECTOMY    . APPENDECTOMY    . CESAREAN SECTION     x 4  . CESAREAN SECTION     x4  . PARS PLANA VITRECTOMY Right 04/02/2014   Procedure: RIGHT PARS PLANA VITRECTOMY WITH 25 GAUGE/ENDO LASER/LENSECTOMY/INSERTION OF SILICONE OIL,REPAIR COMPLEX RETINAL DETACHMENT,MEMBRANE PEEL, IRIDECTOMY;  Surgeon: Hurman Horn, MD;  Location: [facility];  Service: Ophthalmology;  Laterality: Right;   Family History  Problem Relation Age of Onset  . Diabetes Mother   . Alcohol abuse Father   . Cirrhosis Father   . Breast cancer Maternal Aunt   . Colon cancer Maternal Aunt   . Stomach cancer Brother   . Esophageal cancer Brother   . Diabetes Brother   . Diabetes Maternal Uncle   . Heart disease Unknown        grandfather/grandmother   Social History   Tobacco Use  . Smoking status: Never Smoker  . Smokeless tobacco: Never Used  Substance Use Topics  . Alcohol use: Yes    Alcohol/week: 0.0 oz    Comment: occ- very rarely  . Drug use: No    ROS Constitutional: Denies fever, chills, weight loss/gain, headaches, insomnia,  night sweats, and change in appetite. Does c/o fatigue. Eyes: Denies redness, blurred vision, diplopia, discharge, itchy, watery eyes.  ENT: Denies discharge, congestion, post  nasal drip, epistaxis, sore throat, earache, hearing loss, dental pain, Tinnitus, Vertigo, Sinus pain, snoring.  Cardio: Denies chest pain, palpitations, irregular heartbeat, syncope, dyspnea, diaphoresis, orthopnea, PND, claudication, edema Respiratory: denies cough, dyspnea, DOE, pleurisy, hoarseness, laryngitis, wheezing.  Gastrointestinal: Denies dysphagia, heartburn, reflux, water brash, pain, cramps, nausea, vomiting, bloating, diarrhea, constipation, hematemesis, melena, hematochezia, jaundice, hemorrhoids Genitourinary: Denies dysuria, frequency, urgency, nocturia, hesitancy, discharge, hematuria, flank pain Breast: Breast lumps, nipple discharge, bleeding.  Musculoskeletal: Denies arthralgia, myalgia, stiffness, Jt. Swelling, pain, limp, and strain/sprain. Denies falls. Skin: Denies puritis, rash, hives, warts, acne, eczema, changing in skin lesion Neuro: No weakness, tremor, incoordination, spasms, paresthesia, pain Psychiatric: Denies confusion, memory loss, sensory loss. Denies Depression. Endocrine: Denies change in weight, skin, hair change, nocturia, and paresthesia, diabetic polys, visual blurring, hyper / hypo glycemic episodes.  Heme/Lymph: No excessive  bleeding, bruising, enlarged lymph nodes.  Physical Exam  BP (!) 149/92   Pulse 80   Temp (!) 97.3 F (36.3 C)   Resp 14   Ht 5\' 3"  (1.6 m)   Wt 122 lb (55.3 kg)   BMI 21.61 kg/m   General Appearance: Well nourished, well groomed and in no apparent distress.  Eyes: Dense Rt corneal scar (old) and Lt pupil is reactive. Patient will track w/ eye conjugately.   EOMs, conjunctiva no swelling or erythema, normal fundi and vessels. Sinuses: No frontal/maxillary tenderness ENT/Mouth: EACs patent / TMs  nl. Nares clear without erythema, swelling, mucoid exudates. Oral hygiene is good. No erythema, swelling, or exudate. Tongue normal, non-obstructing. Tonsils not swollen or erythematous. Hearing normal.  Neck: Supple, thyroid  not palpable. No bruits, nodes or JVD. Respiratory: Respiratory effort normal.  BS equal and clear bilateral without rales, rhonci, wheezing or stridor. Cardio: Heart sounds are normal with regular rate and rhythm and no murmurs, rubs or gallops. Peripheral pulses are normal and equal bilaterally without edema. No aortic or femoral bruits. Chest: symmetric with normal excursions and percussion. Breasts: Symmetric, atrophic with no palpable breast tissue without lumps, nipple discharge, retractions, or fibrocystic changes.  Abdomen: Flat, soft with bowel sounds active. Nontender, no guarding, rebound, hernias, masses, or organomegaly.  Lymphatics: Non tender without lymphadenopathy.  Musculoskeletal: Generalized muscular wasting with increased tone, mild cog wheeling - Lt>Rt. Stooped posture with shuffling broad-based gait. Skin: Warm and dry without rashes, lesions, cyanosis, clubbing or  ecchymosis.  Neuro: No facial asymmetry with Cranial nerves intact, reflexes brisk. Normal muscle tone.  Sensation intact to tactile stimulation.   Assessment and Plan  1. Essential hypertension  2. Hyperlipidemia, mixed  3. Abnormal glucose  4. Vitamin D deficiency  5. Dementia in corticobasal degeneration  6. Screening for colorectal cancer  - hemoccult cards   7. Medication management       Patient would not cooperate for venipuncture. Discussed med's effects and SE's. Screening labs and tests as requested with regular follow-up as recommended. Over 40 minutes of exam, counseling, chart review and high complex critical decision making was performed.

## 2018-02-12 NOTE — Patient Instructions (Signed)
Dementia Caregiver Guide  Dementia is a term used to describe a number of symptoms that affect memory and thinking. The most common symptoms include:  Memory loss.  Trouble with language and communication.  Trouble concentrating.  Poor judgment.  Problems with reasoning.  Child-like behavior and language.  Extreme anxiety.  Angry outbursts.  Wandering from home or public places.  Dementia usually gets worse slowly over time. In the early stages, people with dementia can stay independent and safe with some help. In later stages, they need help with daily tasks such as dressing, grooming, and using the bathroom. How to help the person with dementia cope Dementia can be frightening and confusing. Here are some tips to help the person with dementia cope with changes caused by the disease. General tips  Keep the person on track with his or her routine.  Try to identify areas where the person may need help.  Be supportive, patient, calm, and encouraging.  Gently remind the person that adjusting to changes takes time.  Help with the tasks that the person has asked for help with.  Keep the person involved in daily tasks and decisions as much as possible.  Encourage conversation, but try not to get frustrated or harried if the person struggles to find words or does not seem to appreciate your help. Communication tips  When the person is talking or seems frustrated, make eye contact and hold the person's hand.  Ask specific questions that need yes or no answers.  Use simple words, short sentences, and a calm voice. Only give one direction at a time.  When offering choices, limit them to just 1 or 2.  Avoid correcting the person in a negative way.  If the person is struggling to find the right words, gently try to help him or her. How to recognize symptoms of stress Symptoms of stress in caregivers include:  Feeling frustrated or angry with the person with  dementia.  Denying that the person has dementia or that his or her symptoms will not improve.  Feeling hopeless and unappreciated.  Difficulty sleeping.  Difficulty concentrating.  Feeling anxious, irritable, or depressed.  Developing stress-related health problems.  Feeling like you have too little time for your own life.  Follow these instructions at home:  Make sure that you and the person you are caring for: ? Get regular sleep. ? Exercise regularly. ? Eat regular, nutritious meals. ? Drink enough fluid to keep your urine clear or pale yellow. ? Take over-the-counter and prescription medicines only as told by your health care providers. ? Attend all scheduled health care appointments.  Join a support group with others who are caregivers.  Ask about respite care resources so that you can have a regular break from the stress of caregiving.  Look for signs of stress in yourself and in the person you are caring for. If you notice signs of stress, take steps to manage it.  Consider any safety risks and take steps to avoid them.  Organize medications in a pill box for each day of the week.  Create a plan to handle any legal or financial matters. Get legal or financial advice if needed.  Keep a calendar in a central location to remind the person of appointments or other activities.   Tips for reducing the risk of injury  Keep floors clear of clutter. Remove rugs, magazine racks, and floor lamps.  Keep hallways well lit, especially at night.  Put a handrail and nonslip mat  in the bathtub or shower.  Put childproof locks on cabinets that contain dangerous items, such as medicines, alcohol, guns, toxic cleaning items, sharp tools or utensils, matches, and lighters.  Put the locks in places where the person cannot see or reach them easily. This will help ensure that the person does not wander out of the house and get lost.  Be prepared for emergencies. Keep a list of  emergency phone numbers and addresses in a convenient area.  Remove car keys and lock garage doors so that the person does not try to get in the car and drive.  Have the person wear a bracelet that tracks locations and identifies the person as having memory problems. This should be worn at all times for safety.   Where to find support: Many individuals and organizations offer support. These include:  Support groups for people with dementia and for caregivers.  Counselors or therapists.  Home health care services.  Adult day care centers.  Where to find more information: Alzheimer's Association: [employer] Contact a health care provider if:  The person's health is rapidly getting worse.  You are no longer able to care for the person.  Caring for the person is affecting your physical and emotional health.  The person threatens himself or herself, you, or anyone else. Summary  Dementia is a term used to describe a number of symptoms that affect memory and thinking.  Dementia usually gets worse slowly over time.  Take steps to reduce the person's risk of injury, and to plan for future care.  Caregivers need support, relief from caregiving, and time for their own lives. ++++++++++++++++++++++++++++++ DEMENTIA      DEMENTIA is a progressive brain disorder. In Dementia, a loss of brain cells and a buildup of abnormal proteins inside brain cells cause changes in behavior and speech. Over time, the disease causes the frontal and temporal anterior lobes of the brain to shrink. These are the parts of the brain that control behaviors and speech.     Dementia  is one of the most common causes of progressive mental disability in people younger than 60 years. The disease progresses faster in some people than in others.  What are the causes?  The exact cause of Pick disease is not known. Some genetic changes in chromosomes have been linked to Pick disease. The disease tends to run in  families. Family members of people with Pick disease should consider genetic counseling.  What are the signs or symptoms?   Signs and symptoms of Pick disease usually start between the ages of 51 years and 25 years. Behavior symptoms may include:  Impulsive, poorly controlled, inappropriate, or embarrassing behavior.  Lack of motivation and interest (apathy).  Irritability and agitation.  Neglect of personal hygiene.  Withdrawal.  Repetitive behaviors.  Impulsive eating.  Lack of concern for others.  Failure to recognize behavioral changes.  Inability to speak.  Inability to write or read.  Slurred speech.  Loss of vocabulary.  How is this diagnosed? Your health care provider may suspect Dementia if you have progressive behavior or speech difficulties. You may need to see specialists in brain and behavioral health. The following tests may be done:  Blood tests to rule out other causes, like vitamin deficiency, harmful effects of substances (toxicities), and infections.  Spinal tap (lumbar puncture) to check spinal fluid samples for abnormal proteins.  Image studies (especially MRI) to take pictures of the brain. These can show brain changes that suggest Dementia  or another brain disorder.  How is this treated? There is no cure for Dementia. No treatments can slow the progression of the disease. Support at home is the most important aspect of managing Dementia. Ask about caregiving resources in the community. Other aspects of management include:  Antidepressants to help with apathy.  Sedative drugs to control aggressive or dangerous behavior.  Occupational therapy to help with home safety and activity.  Speech and language therapy.  Behavioral therapy.  Institutional care, at some point, if needed.  Follow these instructions at home: Changes you can make at home that may help you manage Dementia include:  Keeping a regular routine.  Avoiding stress or  surprises.  Avoiding activities that trigger aggressive behavior.  Following a healthy diet.  Avoiding excess sugar in the diet.  Watching for signs of compulsive eating, which can lead to other health problems.  Avoiding alcohol.  Scheduling regular, enjoyable, and supervised exercise.  Contact a health care provider if:  Symptoms change or get worse.  It becomes more difficult or stressful to be cared for at home.   Get help right away if:  It is no longer possible to be cared for at home.  It is dangerous to be cared for at home.

## 2018-02-16 ENCOUNTER — Encounter: Payer: Self-pay | Admitting: Internal Medicine

## 2018-04-08 ENCOUNTER — Other Ambulatory Visit: Payer: Self-pay | Admitting: Adult Health

## 2018-04-08 ENCOUNTER — Telehealth: Payer: Self-pay

## 2018-04-08 MED ORDER — CIPROFLOXACIN HCL 500 MG PO TABS: 500.0000 mg | ORAL_TABLET | Freq: Two times a day (BID) | ORAL | 0 refills | Status: AC

## 2018-04-08 NOTE — Telephone Encounter (Signed)
Helen Cabrera stopped in the office today to talk with someone in regard to [place] having a UTI. Had performed an at home test using a test strip in the patient's pull up. Test strip was bright pink which indicates a positive test result. Patient does show some discomfort by moaning when she voids and urine smells very concentrated. Please advise if patient needs to be seen or if you will treat for a UTI instead.

## 2018-04-08 NOTE — Telephone Encounter (Signed)
Helen Cabrera notified about antibiotic being sent in and patient is scheduled to follow up on Thursday afternoon.

## 2018-04-10 NOTE — Progress Notes (Signed)
Assessment and Plan:  Diagnoses and all orders for this visit:  Agitation/ Bad odor of urine Non-verbal, Presumed UTI, non-verbal, cannot produce urine specimen for analysis Back to baseline on day 3 of cipro course VS normal, will check CBC, BMP/GFR Continue to monitor, follow up for further agitation or concerning symptoms Continue to encourage fluids Present to ED for sudden/severe symptoms  Further disposition pending results of labs. Discussed med's effects and SE's.   Over 15 minutes of exam, counseling, chart review, and critical decision making was performed.   Future Appointments  Date Time Provider [place]  05/16/2018  4:30 PM Helen Comber, NP [facility] None  08/20/2018  4:30 PM Helen Pinto, MD [facility] None  09/03/2018  4:00 PM Helen Sprang, MD [facility] None  03/06/2019  2:00 PM Helen Pinto, MD [facility] None    ------------------------------------------------------------------------------------------------------------------   HPI BP 138/82   Pulse 92   Temp (!) 96.8 F (36 C)   Ht _0  (1.6 m)   Wt 117 lb (53.1 kg)   SpO2 98%   BMI 20.73 kg/m   62 y.o.female with severe rapidly progressive dementia due to corticobasal degeneration presents accompanied by husband (caregiver) and daughter for follow up; he called to express concen regarding possible UTI, reporting strong urine odor and + UTI per home screening kit?, patient is non-verbal but was reported to be moaning with voiding in pull up. She had been lethargic and wasn't eating well. She was symptomatic for several days as of 9/16 when message was received and unable to be seen that day. Consequently cipro was initiated and she was scheduled for follow up today to ensure improvement, she is on day 3 of treatment, per husband and daughter she is back to her baseline. She is unable to produce a urine sample in hat or cup for specimen, but urine odor is reportedly resolved.   BMI is Body  mass index is 20.73 kg/m.  Wt Readings from Last 3 Encounters:  04/11/18 117 lb (53.1 kg)  02/12/18 122 lb (55.3 kg)  12/21/17 115 lb (52.2 kg)      Past Medical History:  Diagnosis Date  . Anemia    Iron def   . Bacterial overgrowth syndrome   . Chronic headaches   . Dementia   . Depression   . Family history of malignant neoplasm of gastrointestinal tract   . Headache(784.0)   . Hypercholesterolemia   . Hypertension   . IBS (irritable bowel syndrome)   . Osteoarthritis   . Pancreatitis   . Vitamin B12 deficiency      Allergies  Allergen Reactions  . Amitriptyline Other (See Comments)    dysphoria  . Carafate [Sucralfate] Other (See Comments)    constipation  . Amitriptyline Other (See Comments)    dysphoria  . Carafate [Sucralfate] Other (See Comments)    constipation  . Codeine Other (See Comments)    Insomnia; agitation; restlessness   . Lexapro [Escitalopram Oxalate] Other (See Comments)    Insomnia; agitation; restlessness   . Paxil [Paroxetine Hcl] Other (See Comments)    Insomnia; agitation; restlessness   . Prednisone Other (See Comments)    Unknown reaction  . Promethazine Nausea Only  . Zoloft [Sertraline Hcl] Other (See Comments)    Insomnia; agitation; restlessness   . Codeine Other (See Comments)    Insomnia; agitation; restlessness   . Lexapro [Escitalopram Oxalate] Other (See Comments)    Insomnia; agitation; restlessness   . Paxil [Paroxetine Hcl] Other (See  Comments)    Insomnia; agitation; restlessness   . Prednisone Other (See Comments)    unknown  . Promethazine Hcl Nausea Only  . Zoloft [Sertraline Hcl] Other (See Comments)    Insomnia; agitation; restlessness     Current Outpatient Medications on File Prior to Visit  Medication Sig  . atenolol (TENORMIN) 100 MG tablet TAKE 1 TABLET EVERY DAY FOR BLOOD PRESSURE (Patient taking differently: Take 50 mg by mouth at bedtime. )  . ciprofloxacin (CIPRO) 500 MG tablet Take 1 tablet (500  mg total) by mouth 2 (two) times daily for 7 days.  Marland Kitchen guaiFENesin (MUCINEX) 600 MG 12 hr tablet Take 600 mg by mouth daily as needed for cough or to loosen phlegm.   . polyethylene glycol (MIRALAX / GLYCOLAX) packet Take 17 g by mouth daily. (Patient taking differently: Take 17 g by mouth daily as needed for mild constipation. )  . Valproate Sodium (DEPAKENE) 250 MG/5ML SOLN solution Take 631m in AM, 136min PM (Patient taking differently: Take 250-500 mg by mouth See admin instructions. Take 31m28mn the morning and take 31m331m the evening)   No current facility-administered medications on file prior to visit.     ROS: all negative except above.   Physical Exam:  BP 138/82   Pulse 92   Temp (!) 96.8 F (36 C)   Ht _0  (1.6 m)   Wt 117 lb (53.1 kg)   SpO2 98%   BMI 20.73 kg/m   General Appearance: Well nourished, fairly dressed/hygiene, in wheelchair, in no apparent distress. Eyes: PERRLA, conjunctiva no swelling or erythema Sinuses: No Frontal/maxillary tenderness ENT/Mouth: Ext aud canals clear, TMs without erythema, bulging. No erythema, swelling, or exudate on post pharynx.  Tonsils not swollen or erythematous.  Neck: Supple, thyroid normal.  Respiratory: Respiratory effort normal, BS equal bilaterally without rales, rhonchi, wheezing or stridor.  Cardio: RRR with no MRGs. Brisk peripheral pulses without edema.  Abdomen: Soft, + BS.  Non tender, no guarding, rebound, hernias, masses. Lymphatics: Non tender without lymphadenopathy.  Musculoskeletal: in wheelchair Skin: Warm, dry without rashes, lesions, ecchymosis.  Neuro: Normal muscle tone Psych: Awake, nonverbal    [name] 4:32 PM [facility]

## 2018-04-11 ENCOUNTER — Encounter: Payer: Self-pay | Admitting: Adult Health

## 2018-04-11 ENCOUNTER — Ambulatory Visit (INDEPENDENT_AMBULATORY_CARE_PROVIDER_SITE_OTHER): Payer: Medicare Other | Admitting: Adult Health

## 2018-04-11 VITALS — BP 138/82 | HR 92 | Temp 96.8°F | Ht 63.0 in | Wt 117.0 lb

## 2018-04-11 DIAGNOSIS — R8290 Unspecified abnormal findings in urine: Secondary | ICD-10-CM

## 2018-04-11 DIAGNOSIS — R451 Restlessness and agitation: Secondary | ICD-10-CM | POA: Diagnosis not present

## 2018-04-11 LAB — CBC WITH DIFFERENTIAL/PLATELET
BASOS PCT: 0.6 %
Basophils Absolute: 62 cells/uL (ref 0–200)
Eosinophils Absolute: 135 cells/uL (ref 15–500)
Eosinophils Relative: 1.3 %
HCT: 37.8 % (ref 35.0–45.0)
Hemoglobin: 12.8 g/dL (ref 11.7–15.5)
Lymphs Abs: 1498 cells/uL (ref 850–3900)
MCH: 30.9 pg (ref 27.0–33.0)
MCHC: 33.9 g/dL (ref 32.0–36.0)
MCV: 91.3 fL (ref 80.0–100.0)
MPV: 9.8 fL (ref 7.5–12.5)
Monocytes Relative: 10.9 %
NEUTROS PCT: 72.8 %
Neutro Abs: 7571 cells/uL (ref 1500–7800)
PLATELETS: 393 10*3/uL (ref 140–400)
RBC: 4.14 10*6/uL (ref 3.80–5.10)
RDW: 12 % (ref 11.0–15.0)
TOTAL LYMPHOCYTE: 14.4 %
WBC: 10.4 10*3/uL (ref 3.8–10.8)
WBCMIX: 1134 {cells}/uL — AB (ref 200–950)

## 2018-04-11 LAB — BASIC METABOLIC PANEL WITH GFR
BUN/Creatinine Ratio: 35 (calc) — ABNORMAL HIGH (ref 6–22)
BUN: 34 mg/dL — ABNORMAL HIGH (ref 7–25)
CALCIUM: 8.9 mg/dL (ref 8.6–10.4)
CO2: 28 mmol/L (ref 20–32)
CREATININE: 0.96 mg/dL (ref 0.50–0.99)
Chloride: 104 mmol/L (ref 98–110)
GFR, EST AFRICAN AMERICAN: 73 mL/min/{1.73_m2} (ref >=60)
GFR, Est Non African American: 63 mL/min/{1.73_m2} (ref >=60)
Glucose, Bld: 104 mg/dL — ABNORMAL HIGH (ref 65–99)
Potassium: 4.4 mmol/L (ref 3.5–5.3)
Sodium: 143 mmol/L (ref 135–146)

## 2018-04-20 ENCOUNTER — Other Ambulatory Visit: Payer: Self-pay | Admitting: Internal Medicine

## 2018-04-20 DIAGNOSIS — G3185 Corticobasal degeneration: Principal | ICD-10-CM

## 2018-04-20 DIAGNOSIS — G301 Alzheimer's disease with late onset: Secondary | ICD-10-CM

## 2018-04-20 DIAGNOSIS — F0280 Dementia in other diseases classified elsewhere without behavioral disturbance: Secondary | ICD-10-CM

## 2018-04-23 DIAGNOSIS — I10 Essential (primary) hypertension: Secondary | ICD-10-CM | POA: Diagnosis not present

## 2018-04-23 DIAGNOSIS — K589 Irritable bowel syndrome without diarrhea: Secondary | ICD-10-CM | POA: Diagnosis not present

## 2018-04-23 DIAGNOSIS — G3185 Corticobasal degeneration: Secondary | ICD-10-CM | POA: Diagnosis not present

## 2018-04-23 DIAGNOSIS — F339 Major depressive disorder, recurrent, unspecified: Secondary | ICD-10-CM | POA: Diagnosis not present

## 2018-04-23 DIAGNOSIS — G4089 Other seizures: Secondary | ICD-10-CM | POA: Diagnosis not present

## 2018-04-23 DIAGNOSIS — E785 Hyperlipidemia, unspecified: Secondary | ICD-10-CM | POA: Diagnosis not present

## 2018-04-23 DIAGNOSIS — N390 Urinary tract infection, site not specified: Secondary | ICD-10-CM | POA: Diagnosis not present

## 2018-04-23 DIAGNOSIS — F0280 Dementia in other diseases classified elsewhere without behavioral disturbance: Secondary | ICD-10-CM | POA: Diagnosis not present

## 2018-04-24 ENCOUNTER — Telehealth: Payer: Self-pay

## 2018-04-24 DIAGNOSIS — G4089 Other seizures: Secondary | ICD-10-CM | POA: Diagnosis not present

## 2018-04-24 DIAGNOSIS — I10 Essential (primary) hypertension: Secondary | ICD-10-CM | POA: Diagnosis not present

## 2018-04-24 DIAGNOSIS — G3185 Corticobasal degeneration: Secondary | ICD-10-CM | POA: Diagnosis not present

## 2018-04-24 DIAGNOSIS — F339 Major depressive disorder, recurrent, unspecified: Secondary | ICD-10-CM | POA: Diagnosis not present

## 2018-04-24 DIAGNOSIS — F0280 Dementia in other diseases classified elsewhere without behavioral disturbance: Secondary | ICD-10-CM | POA: Diagnosis not present

## 2018-04-24 DIAGNOSIS — N390 Urinary tract infection, site not specified: Secondary | ICD-10-CM | POA: Diagnosis not present

## 2018-04-24 NOTE — Telephone Encounter (Signed)
Received notice that pt has been placed in Hospice.

## 2018-04-29 DIAGNOSIS — F0280 Dementia in other diseases classified elsewhere without behavioral disturbance: Secondary | ICD-10-CM | POA: Diagnosis not present

## 2018-04-29 DIAGNOSIS — N390 Urinary tract infection, site not specified: Secondary | ICD-10-CM | POA: Diagnosis not present

## 2018-04-29 DIAGNOSIS — F339 Major depressive disorder, recurrent, unspecified: Secondary | ICD-10-CM | POA: Diagnosis not present

## 2018-04-29 DIAGNOSIS — I10 Essential (primary) hypertension: Secondary | ICD-10-CM | POA: Diagnosis not present

## 2018-04-29 DIAGNOSIS — G3185 Corticobasal degeneration: Secondary | ICD-10-CM | POA: Diagnosis not present

## 2018-04-29 DIAGNOSIS — G4089 Other seizures: Secondary | ICD-10-CM | POA: Diagnosis not present

## 2018-04-30 DIAGNOSIS — F339 Major depressive disorder, recurrent, unspecified: Secondary | ICD-10-CM | POA: Diagnosis not present

## 2018-04-30 DIAGNOSIS — N390 Urinary tract infection, site not specified: Secondary | ICD-10-CM | POA: Diagnosis not present

## 2018-04-30 DIAGNOSIS — F0280 Dementia in other diseases classified elsewhere without behavioral disturbance: Secondary | ICD-10-CM | POA: Diagnosis not present

## 2018-04-30 DIAGNOSIS — G3185 Corticobasal degeneration: Secondary | ICD-10-CM | POA: Diagnosis not present

## 2018-04-30 DIAGNOSIS — I10 Essential (primary) hypertension: Secondary | ICD-10-CM | POA: Diagnosis not present

## 2018-04-30 DIAGNOSIS — G4089 Other seizures: Secondary | ICD-10-CM | POA: Diagnosis not present

## 2018-05-02 DIAGNOSIS — F0280 Dementia in other diseases classified elsewhere without behavioral disturbance: Secondary | ICD-10-CM | POA: Diagnosis not present

## 2018-05-02 DIAGNOSIS — G3185 Corticobasal degeneration: Secondary | ICD-10-CM | POA: Diagnosis not present

## 2018-05-02 DIAGNOSIS — G4089 Other seizures: Secondary | ICD-10-CM | POA: Diagnosis not present

## 2018-05-02 DIAGNOSIS — I10 Essential (primary) hypertension: Secondary | ICD-10-CM | POA: Diagnosis not present

## 2018-05-02 DIAGNOSIS — F339 Major depressive disorder, recurrent, unspecified: Secondary | ICD-10-CM | POA: Diagnosis not present

## 2018-05-02 DIAGNOSIS — N390 Urinary tract infection, site not specified: Secondary | ICD-10-CM | POA: Diagnosis not present

## 2018-05-03 DIAGNOSIS — F339 Major depressive disorder, recurrent, unspecified: Secondary | ICD-10-CM | POA: Diagnosis not present

## 2018-05-03 DIAGNOSIS — G4089 Other seizures: Secondary | ICD-10-CM | POA: Diagnosis not present

## 2018-05-03 DIAGNOSIS — G3185 Corticobasal degeneration: Secondary | ICD-10-CM | POA: Diagnosis not present

## 2018-05-03 DIAGNOSIS — F0280 Dementia in other diseases classified elsewhere without behavioral disturbance: Secondary | ICD-10-CM | POA: Diagnosis not present

## 2018-05-03 DIAGNOSIS — I10 Essential (primary) hypertension: Secondary | ICD-10-CM | POA: Diagnosis not present

## 2018-05-03 DIAGNOSIS — N390 Urinary tract infection, site not specified: Secondary | ICD-10-CM | POA: Diagnosis not present

## 2018-05-06 DIAGNOSIS — I10 Essential (primary) hypertension: Secondary | ICD-10-CM | POA: Diagnosis not present

## 2018-05-06 DIAGNOSIS — F0280 Dementia in other diseases classified elsewhere without behavioral disturbance: Secondary | ICD-10-CM | POA: Diagnosis not present

## 2018-05-06 DIAGNOSIS — G3185 Corticobasal degeneration: Secondary | ICD-10-CM | POA: Diagnosis not present

## 2018-05-06 DIAGNOSIS — F339 Major depressive disorder, recurrent, unspecified: Secondary | ICD-10-CM | POA: Diagnosis not present

## 2018-05-06 DIAGNOSIS — N390 Urinary tract infection, site not specified: Secondary | ICD-10-CM | POA: Diagnosis not present

## 2018-05-06 DIAGNOSIS — G4089 Other seizures: Secondary | ICD-10-CM | POA: Diagnosis not present

## 2018-05-07 DIAGNOSIS — F339 Major depressive disorder, recurrent, unspecified: Secondary | ICD-10-CM | POA: Diagnosis not present

## 2018-05-07 DIAGNOSIS — I10 Essential (primary) hypertension: Secondary | ICD-10-CM | POA: Diagnosis not present

## 2018-05-07 DIAGNOSIS — G4089 Other seizures: Secondary | ICD-10-CM | POA: Diagnosis not present

## 2018-05-07 DIAGNOSIS — N390 Urinary tract infection, site not specified: Secondary | ICD-10-CM | POA: Diagnosis not present

## 2018-05-07 DIAGNOSIS — G3185 Corticobasal degeneration: Secondary | ICD-10-CM | POA: Diagnosis not present

## 2018-05-07 DIAGNOSIS — F0280 Dementia in other diseases classified elsewhere without behavioral disturbance: Secondary | ICD-10-CM | POA: Diagnosis not present

## 2018-05-09 DIAGNOSIS — N390 Urinary tract infection, site not specified: Secondary | ICD-10-CM | POA: Diagnosis not present

## 2018-05-09 DIAGNOSIS — F339 Major depressive disorder, recurrent, unspecified: Secondary | ICD-10-CM | POA: Diagnosis not present

## 2018-05-09 DIAGNOSIS — G4089 Other seizures: Secondary | ICD-10-CM | POA: Diagnosis not present

## 2018-05-09 DIAGNOSIS — F0280 Dementia in other diseases classified elsewhere without behavioral disturbance: Secondary | ICD-10-CM | POA: Diagnosis not present

## 2018-05-09 DIAGNOSIS — I10 Essential (primary) hypertension: Secondary | ICD-10-CM | POA: Diagnosis not present

## 2018-05-09 DIAGNOSIS — G3185 Corticobasal degeneration: Secondary | ICD-10-CM | POA: Diagnosis not present

## 2018-05-13 DIAGNOSIS — G3185 Corticobasal degeneration: Secondary | ICD-10-CM | POA: Diagnosis not present

## 2018-05-13 DIAGNOSIS — N390 Urinary tract infection, site not specified: Secondary | ICD-10-CM | POA: Diagnosis not present

## 2018-05-13 DIAGNOSIS — F339 Major depressive disorder, recurrent, unspecified: Secondary | ICD-10-CM | POA: Diagnosis not present

## 2018-05-13 DIAGNOSIS — G4089 Other seizures: Secondary | ICD-10-CM | POA: Diagnosis not present

## 2018-05-13 DIAGNOSIS — F0280 Dementia in other diseases classified elsewhere without behavioral disturbance: Secondary | ICD-10-CM | POA: Diagnosis not present

## 2018-05-13 DIAGNOSIS — I10 Essential (primary) hypertension: Secondary | ICD-10-CM | POA: Diagnosis not present

## 2018-05-14 DIAGNOSIS — F339 Major depressive disorder, recurrent, unspecified: Secondary | ICD-10-CM | POA: Diagnosis not present

## 2018-05-14 DIAGNOSIS — G3185 Corticobasal degeneration: Secondary | ICD-10-CM | POA: Diagnosis not present

## 2018-05-14 DIAGNOSIS — I10 Essential (primary) hypertension: Secondary | ICD-10-CM | POA: Diagnosis not present

## 2018-05-14 DIAGNOSIS — G4089 Other seizures: Secondary | ICD-10-CM | POA: Diagnosis not present

## 2018-05-14 DIAGNOSIS — F0280 Dementia in other diseases classified elsewhere without behavioral disturbance: Secondary | ICD-10-CM | POA: Diagnosis not present

## 2018-05-14 DIAGNOSIS — N390 Urinary tract infection, site not specified: Secondary | ICD-10-CM | POA: Diagnosis not present

## 2018-05-16 ENCOUNTER — Ambulatory Visit: Payer: Self-pay | Admitting: Adult Health

## 2018-05-16 DIAGNOSIS — F339 Major depressive disorder, recurrent, unspecified: Secondary | ICD-10-CM | POA: Diagnosis not present

## 2018-05-16 DIAGNOSIS — G4089 Other seizures: Secondary | ICD-10-CM | POA: Diagnosis not present

## 2018-05-16 DIAGNOSIS — F0280 Dementia in other diseases classified elsewhere without behavioral disturbance: Secondary | ICD-10-CM | POA: Diagnosis not present

## 2018-05-16 DIAGNOSIS — G3185 Corticobasal degeneration: Secondary | ICD-10-CM | POA: Diagnosis not present

## 2018-05-16 DIAGNOSIS — N390 Urinary tract infection, site not specified: Secondary | ICD-10-CM | POA: Diagnosis not present

## 2018-05-16 DIAGNOSIS — I10 Essential (primary) hypertension: Secondary | ICD-10-CM | POA: Diagnosis not present

## 2018-05-17 DIAGNOSIS — G4089 Other seizures: Secondary | ICD-10-CM | POA: Diagnosis not present

## 2018-05-17 DIAGNOSIS — G3185 Corticobasal degeneration: Secondary | ICD-10-CM | POA: Diagnosis not present

## 2018-05-17 DIAGNOSIS — F0280 Dementia in other diseases classified elsewhere without behavioral disturbance: Secondary | ICD-10-CM | POA: Diagnosis not present

## 2018-05-17 DIAGNOSIS — N390 Urinary tract infection, site not specified: Secondary | ICD-10-CM | POA: Diagnosis not present

## 2018-05-17 DIAGNOSIS — I10 Essential (primary) hypertension: Secondary | ICD-10-CM | POA: Diagnosis not present

## 2018-05-17 DIAGNOSIS — F339 Major depressive disorder, recurrent, unspecified: Secondary | ICD-10-CM | POA: Diagnosis not present

## 2018-05-20 DIAGNOSIS — F0280 Dementia in other diseases classified elsewhere without behavioral disturbance: Secondary | ICD-10-CM | POA: Diagnosis not present

## 2018-05-20 DIAGNOSIS — I10 Essential (primary) hypertension: Secondary | ICD-10-CM | POA: Diagnosis not present

## 2018-05-20 DIAGNOSIS — G3185 Corticobasal degeneration: Secondary | ICD-10-CM | POA: Diagnosis not present

## 2018-05-20 DIAGNOSIS — G4089 Other seizures: Secondary | ICD-10-CM | POA: Diagnosis not present

## 2018-05-20 DIAGNOSIS — F339 Major depressive disorder, recurrent, unspecified: Secondary | ICD-10-CM | POA: Diagnosis not present

## 2018-05-20 DIAGNOSIS — N390 Urinary tract infection, site not specified: Secondary | ICD-10-CM | POA: Diagnosis not present

## 2018-05-21 DIAGNOSIS — I10 Essential (primary) hypertension: Secondary | ICD-10-CM | POA: Diagnosis not present

## 2018-05-21 DIAGNOSIS — G4089 Other seizures: Secondary | ICD-10-CM | POA: Diagnosis not present

## 2018-05-21 DIAGNOSIS — F339 Major depressive disorder, recurrent, unspecified: Secondary | ICD-10-CM | POA: Diagnosis not present

## 2018-05-21 DIAGNOSIS — N390 Urinary tract infection, site not specified: Secondary | ICD-10-CM | POA: Diagnosis not present

## 2018-05-21 DIAGNOSIS — G3185 Corticobasal degeneration: Secondary | ICD-10-CM | POA: Diagnosis not present

## 2018-05-21 DIAGNOSIS — F0280 Dementia in other diseases classified elsewhere without behavioral disturbance: Secondary | ICD-10-CM | POA: Diagnosis not present

## 2018-05-22 DIAGNOSIS — N390 Urinary tract infection, site not specified: Secondary | ICD-10-CM | POA: Diagnosis not present

## 2018-05-22 DIAGNOSIS — F0280 Dementia in other diseases classified elsewhere without behavioral disturbance: Secondary | ICD-10-CM | POA: Diagnosis not present

## 2018-05-22 DIAGNOSIS — G4089 Other seizures: Secondary | ICD-10-CM | POA: Diagnosis not present

## 2018-05-22 DIAGNOSIS — F339 Major depressive disorder, recurrent, unspecified: Secondary | ICD-10-CM | POA: Diagnosis not present

## 2018-05-22 DIAGNOSIS — I10 Essential (primary) hypertension: Secondary | ICD-10-CM | POA: Diagnosis not present

## 2018-05-22 DIAGNOSIS — G3185 Corticobasal degeneration: Secondary | ICD-10-CM | POA: Diagnosis not present

## 2018-05-23 DIAGNOSIS — N390 Urinary tract infection, site not specified: Secondary | ICD-10-CM | POA: Diagnosis not present

## 2018-05-23 DIAGNOSIS — I10 Essential (primary) hypertension: Secondary | ICD-10-CM | POA: Diagnosis not present

## 2018-05-23 DIAGNOSIS — G3185 Corticobasal degeneration: Secondary | ICD-10-CM | POA: Diagnosis not present

## 2018-05-23 DIAGNOSIS — F339 Major depressive disorder, recurrent, unspecified: Secondary | ICD-10-CM | POA: Diagnosis not present

## 2018-05-23 DIAGNOSIS — F0280 Dementia in other diseases classified elsewhere without behavioral disturbance: Secondary | ICD-10-CM | POA: Diagnosis not present

## 2018-05-23 DIAGNOSIS — G4089 Other seizures: Secondary | ICD-10-CM | POA: Diagnosis not present

## 2018-05-24 DIAGNOSIS — G3185 Corticobasal degeneration: Secondary | ICD-10-CM | POA: Diagnosis not present

## 2018-05-24 DIAGNOSIS — F0280 Dementia in other diseases classified elsewhere without behavioral disturbance: Secondary | ICD-10-CM | POA: Diagnosis not present

## 2018-05-24 DIAGNOSIS — N390 Urinary tract infection, site not specified: Secondary | ICD-10-CM | POA: Diagnosis not present

## 2018-05-24 DIAGNOSIS — K589 Irritable bowel syndrome without diarrhea: Secondary | ICD-10-CM | POA: Diagnosis not present

## 2018-05-24 DIAGNOSIS — F339 Major depressive disorder, recurrent, unspecified: Secondary | ICD-10-CM | POA: Diagnosis not present

## 2018-05-24 DIAGNOSIS — G4089 Other seizures: Secondary | ICD-10-CM | POA: Diagnosis not present

## 2018-05-24 DIAGNOSIS — E785 Hyperlipidemia, unspecified: Secondary | ICD-10-CM | POA: Diagnosis not present

## 2018-05-24 DIAGNOSIS — I10 Essential (primary) hypertension: Secondary | ICD-10-CM | POA: Diagnosis not present

## 2018-05-27 ENCOUNTER — Other Ambulatory Visit: Payer: Self-pay | Admitting: Internal Medicine

## 2018-05-27 DIAGNOSIS — B3731 Acute candidiasis of vulva and vagina: Secondary | ICD-10-CM

## 2018-05-27 DIAGNOSIS — N390 Urinary tract infection, site not specified: Secondary | ICD-10-CM | POA: Diagnosis not present

## 2018-05-27 DIAGNOSIS — G4089 Other seizures: Secondary | ICD-10-CM | POA: Diagnosis not present

## 2018-05-27 DIAGNOSIS — G3185 Corticobasal degeneration: Secondary | ICD-10-CM | POA: Diagnosis not present

## 2018-05-27 DIAGNOSIS — I10 Essential (primary) hypertension: Secondary | ICD-10-CM | POA: Diagnosis not present

## 2018-05-27 DIAGNOSIS — B373 Candidiasis of vulva and vagina: Secondary | ICD-10-CM

## 2018-05-27 DIAGNOSIS — F339 Major depressive disorder, recurrent, unspecified: Secondary | ICD-10-CM | POA: Diagnosis not present

## 2018-05-27 DIAGNOSIS — F0280 Dementia in other diseases classified elsewhere without behavioral disturbance: Secondary | ICD-10-CM | POA: Diagnosis not present

## 2018-05-27 MED ORDER — FLUCONAZOLE 150 MG PO TABS: ORAL_TABLET | ORAL | 0 refills | Status: AC

## 2018-05-27 MED ORDER — KETOCONAZOLE 2 % EX CREA: TOPICAL_CREAM | CUTANEOUS | 3 refills | Status: AC

## 2018-05-28 DIAGNOSIS — G3185 Corticobasal degeneration: Secondary | ICD-10-CM | POA: Diagnosis not present

## 2018-05-28 DIAGNOSIS — F0280 Dementia in other diseases classified elsewhere without behavioral disturbance: Secondary | ICD-10-CM | POA: Diagnosis not present

## 2018-05-28 DIAGNOSIS — F339 Major depressive disorder, recurrent, unspecified: Secondary | ICD-10-CM | POA: Diagnosis not present

## 2018-05-28 DIAGNOSIS — I10 Essential (primary) hypertension: Secondary | ICD-10-CM | POA: Diagnosis not present

## 2018-05-28 DIAGNOSIS — G4089 Other seizures: Secondary | ICD-10-CM | POA: Diagnosis not present

## 2018-05-28 DIAGNOSIS — N390 Urinary tract infection, site not specified: Secondary | ICD-10-CM | POA: Diagnosis not present

## 2018-05-30 DIAGNOSIS — I10 Essential (primary) hypertension: Secondary | ICD-10-CM | POA: Diagnosis not present

## 2018-05-30 DIAGNOSIS — G4089 Other seizures: Secondary | ICD-10-CM | POA: Diagnosis not present

## 2018-05-30 DIAGNOSIS — F339 Major depressive disorder, recurrent, unspecified: Secondary | ICD-10-CM | POA: Diagnosis not present

## 2018-05-30 DIAGNOSIS — G3185 Corticobasal degeneration: Secondary | ICD-10-CM | POA: Diagnosis not present

## 2018-05-30 DIAGNOSIS — N390 Urinary tract infection, site not specified: Secondary | ICD-10-CM | POA: Diagnosis not present

## 2018-05-30 DIAGNOSIS — F0280 Dementia in other diseases classified elsewhere without behavioral disturbance: Secondary | ICD-10-CM | POA: Diagnosis not present

## 2018-05-31 DIAGNOSIS — F0280 Dementia in other diseases classified elsewhere without behavioral disturbance: Secondary | ICD-10-CM | POA: Diagnosis not present

## 2018-05-31 DIAGNOSIS — G3185 Corticobasal degeneration: Secondary | ICD-10-CM | POA: Diagnosis not present

## 2018-05-31 DIAGNOSIS — N390 Urinary tract infection, site not specified: Secondary | ICD-10-CM | POA: Diagnosis not present

## 2018-05-31 DIAGNOSIS — I10 Essential (primary) hypertension: Secondary | ICD-10-CM | POA: Diagnosis not present

## 2018-05-31 DIAGNOSIS — F339 Major depressive disorder, recurrent, unspecified: Secondary | ICD-10-CM | POA: Diagnosis not present

## 2018-05-31 DIAGNOSIS — G4089 Other seizures: Secondary | ICD-10-CM | POA: Diagnosis not present

## 2018-06-03 DIAGNOSIS — F0280 Dementia in other diseases classified elsewhere without behavioral disturbance: Secondary | ICD-10-CM | POA: Diagnosis not present

## 2018-06-03 DIAGNOSIS — G3185 Corticobasal degeneration: Secondary | ICD-10-CM | POA: Diagnosis not present

## 2018-06-03 DIAGNOSIS — G4089 Other seizures: Secondary | ICD-10-CM | POA: Diagnosis not present

## 2018-06-03 DIAGNOSIS — N390 Urinary tract infection, site not specified: Secondary | ICD-10-CM | POA: Diagnosis not present

## 2018-06-03 DIAGNOSIS — I10 Essential (primary) hypertension: Secondary | ICD-10-CM | POA: Diagnosis not present

## 2018-06-03 DIAGNOSIS — F339 Major depressive disorder, recurrent, unspecified: Secondary | ICD-10-CM | POA: Diagnosis not present

## 2018-06-04 DIAGNOSIS — G3185 Corticobasal degeneration: Secondary | ICD-10-CM | POA: Diagnosis not present

## 2018-06-04 DIAGNOSIS — F0280 Dementia in other diseases classified elsewhere without behavioral disturbance: Secondary | ICD-10-CM | POA: Diagnosis not present

## 2018-06-04 DIAGNOSIS — I10 Essential (primary) hypertension: Secondary | ICD-10-CM | POA: Diagnosis not present

## 2018-06-04 DIAGNOSIS — N390 Urinary tract infection, site not specified: Secondary | ICD-10-CM | POA: Diagnosis not present

## 2018-06-04 DIAGNOSIS — G4089 Other seizures: Secondary | ICD-10-CM | POA: Diagnosis not present

## 2018-06-04 DIAGNOSIS — F339 Major depressive disorder, recurrent, unspecified: Secondary | ICD-10-CM | POA: Diagnosis not present

## 2018-06-06 DIAGNOSIS — G3185 Corticobasal degeneration: Secondary | ICD-10-CM | POA: Diagnosis not present

## 2018-06-06 DIAGNOSIS — I10 Essential (primary) hypertension: Secondary | ICD-10-CM | POA: Diagnosis not present

## 2018-06-06 DIAGNOSIS — F0280 Dementia in other diseases classified elsewhere without behavioral disturbance: Secondary | ICD-10-CM | POA: Diagnosis not present

## 2018-06-06 DIAGNOSIS — N390 Urinary tract infection, site not specified: Secondary | ICD-10-CM | POA: Diagnosis not present

## 2018-06-06 DIAGNOSIS — F339 Major depressive disorder, recurrent, unspecified: Secondary | ICD-10-CM | POA: Diagnosis not present

## 2018-06-06 DIAGNOSIS — G4089 Other seizures: Secondary | ICD-10-CM | POA: Diagnosis not present

## 2018-06-07 DIAGNOSIS — F339 Major depressive disorder, recurrent, unspecified: Secondary | ICD-10-CM | POA: Diagnosis not present

## 2018-06-07 DIAGNOSIS — N390 Urinary tract infection, site not specified: Secondary | ICD-10-CM | POA: Diagnosis not present

## 2018-06-07 DIAGNOSIS — F0280 Dementia in other diseases classified elsewhere without behavioral disturbance: Secondary | ICD-10-CM | POA: Diagnosis not present

## 2018-06-07 DIAGNOSIS — G3185 Corticobasal degeneration: Secondary | ICD-10-CM | POA: Diagnosis not present

## 2018-06-07 DIAGNOSIS — I10 Essential (primary) hypertension: Secondary | ICD-10-CM | POA: Diagnosis not present

## 2018-06-07 DIAGNOSIS — G4089 Other seizures: Secondary | ICD-10-CM | POA: Diagnosis not present

## 2018-06-11 DIAGNOSIS — N390 Urinary tract infection, site not specified: Secondary | ICD-10-CM | POA: Diagnosis not present

## 2018-06-11 DIAGNOSIS — I10 Essential (primary) hypertension: Secondary | ICD-10-CM | POA: Diagnosis not present

## 2018-06-11 DIAGNOSIS — F339 Major depressive disorder, recurrent, unspecified: Secondary | ICD-10-CM | POA: Diagnosis not present

## 2018-06-11 DIAGNOSIS — F0280 Dementia in other diseases classified elsewhere without behavioral disturbance: Secondary | ICD-10-CM | POA: Diagnosis not present

## 2018-06-11 DIAGNOSIS — G4089 Other seizures: Secondary | ICD-10-CM | POA: Diagnosis not present

## 2018-06-11 DIAGNOSIS — G3185 Corticobasal degeneration: Secondary | ICD-10-CM | POA: Diagnosis not present

## 2018-06-13 DIAGNOSIS — G4089 Other seizures: Secondary | ICD-10-CM | POA: Diagnosis not present

## 2018-06-13 DIAGNOSIS — G3185 Corticobasal degeneration: Secondary | ICD-10-CM | POA: Diagnosis not present

## 2018-06-13 DIAGNOSIS — F0280 Dementia in other diseases classified elsewhere without behavioral disturbance: Secondary | ICD-10-CM | POA: Diagnosis not present

## 2018-06-13 DIAGNOSIS — N390 Urinary tract infection, site not specified: Secondary | ICD-10-CM | POA: Diagnosis not present

## 2018-06-13 DIAGNOSIS — I10 Essential (primary) hypertension: Secondary | ICD-10-CM | POA: Diagnosis not present

## 2018-06-13 DIAGNOSIS — F339 Major depressive disorder, recurrent, unspecified: Secondary | ICD-10-CM | POA: Diagnosis not present

## 2018-06-14 DIAGNOSIS — F0280 Dementia in other diseases classified elsewhere without behavioral disturbance: Secondary | ICD-10-CM | POA: Diagnosis not present

## 2018-06-14 DIAGNOSIS — G4089 Other seizures: Secondary | ICD-10-CM | POA: Diagnosis not present

## 2018-06-14 DIAGNOSIS — N390 Urinary tract infection, site not specified: Secondary | ICD-10-CM | POA: Diagnosis not present

## 2018-06-14 DIAGNOSIS — I10 Essential (primary) hypertension: Secondary | ICD-10-CM | POA: Diagnosis not present

## 2018-06-14 DIAGNOSIS — F339 Major depressive disorder, recurrent, unspecified: Secondary | ICD-10-CM | POA: Diagnosis not present

## 2018-06-14 DIAGNOSIS — G3185 Corticobasal degeneration: Secondary | ICD-10-CM | POA: Diagnosis not present

## 2018-06-17 DIAGNOSIS — F0280 Dementia in other diseases classified elsewhere without behavioral disturbance: Secondary | ICD-10-CM | POA: Diagnosis not present

## 2018-06-17 DIAGNOSIS — N390 Urinary tract infection, site not specified: Secondary | ICD-10-CM | POA: Diagnosis not present

## 2018-06-17 DIAGNOSIS — F339 Major depressive disorder, recurrent, unspecified: Secondary | ICD-10-CM | POA: Diagnosis not present

## 2018-06-17 DIAGNOSIS — I10 Essential (primary) hypertension: Secondary | ICD-10-CM | POA: Diagnosis not present

## 2018-06-17 DIAGNOSIS — G4089 Other seizures: Secondary | ICD-10-CM | POA: Diagnosis not present

## 2018-06-17 DIAGNOSIS — G3185 Corticobasal degeneration: Secondary | ICD-10-CM | POA: Diagnosis not present

## 2018-06-18 DIAGNOSIS — N390 Urinary tract infection, site not specified: Secondary | ICD-10-CM | POA: Diagnosis not present

## 2018-06-18 DIAGNOSIS — G4089 Other seizures: Secondary | ICD-10-CM | POA: Diagnosis not present

## 2018-06-18 DIAGNOSIS — I10 Essential (primary) hypertension: Secondary | ICD-10-CM | POA: Diagnosis not present

## 2018-06-18 DIAGNOSIS — G3185 Corticobasal degeneration: Secondary | ICD-10-CM | POA: Diagnosis not present

## 2018-06-18 DIAGNOSIS — F339 Major depressive disorder, recurrent, unspecified: Secondary | ICD-10-CM | POA: Diagnosis not present

## 2018-06-18 DIAGNOSIS — F0280 Dementia in other diseases classified elsewhere without behavioral disturbance: Secondary | ICD-10-CM | POA: Diagnosis not present

## 2018-06-21 DIAGNOSIS — G3185 Corticobasal degeneration: Secondary | ICD-10-CM | POA: Diagnosis not present

## 2018-06-21 DIAGNOSIS — N390 Urinary tract infection, site not specified: Secondary | ICD-10-CM | POA: Diagnosis not present

## 2018-06-21 DIAGNOSIS — F339 Major depressive disorder, recurrent, unspecified: Secondary | ICD-10-CM | POA: Diagnosis not present

## 2018-06-21 DIAGNOSIS — G4089 Other seizures: Secondary | ICD-10-CM | POA: Diagnosis not present

## 2018-06-21 DIAGNOSIS — I10 Essential (primary) hypertension: Secondary | ICD-10-CM | POA: Diagnosis not present

## 2018-06-21 DIAGNOSIS — F0280 Dementia in other diseases classified elsewhere without behavioral disturbance: Secondary | ICD-10-CM | POA: Diagnosis not present

## 2018-06-23 DIAGNOSIS — K589 Irritable bowel syndrome without diarrhea: Secondary | ICD-10-CM | POA: Diagnosis not present

## 2018-06-23 DIAGNOSIS — E785 Hyperlipidemia, unspecified: Secondary | ICD-10-CM | POA: Diagnosis not present

## 2018-06-23 DIAGNOSIS — F339 Major depressive disorder, recurrent, unspecified: Secondary | ICD-10-CM | POA: Diagnosis not present

## 2018-06-23 DIAGNOSIS — F0280 Dementia in other diseases classified elsewhere without behavioral disturbance: Secondary | ICD-10-CM | POA: Diagnosis not present

## 2018-06-23 DIAGNOSIS — G3185 Corticobasal degeneration: Secondary | ICD-10-CM | POA: Diagnosis not present

## 2018-06-23 DIAGNOSIS — N390 Urinary tract infection, site not specified: Secondary | ICD-10-CM | POA: Diagnosis not present

## 2018-06-23 DIAGNOSIS — G4089 Other seizures: Secondary | ICD-10-CM | POA: Diagnosis not present

## 2018-06-23 DIAGNOSIS — I10 Essential (primary) hypertension: Secondary | ICD-10-CM | POA: Diagnosis not present

## 2018-06-24 DIAGNOSIS — N390 Urinary tract infection, site not specified: Secondary | ICD-10-CM | POA: Diagnosis not present

## 2018-06-24 DIAGNOSIS — I10 Essential (primary) hypertension: Secondary | ICD-10-CM | POA: Diagnosis not present

## 2018-06-24 DIAGNOSIS — G4089 Other seizures: Secondary | ICD-10-CM | POA: Diagnosis not present

## 2018-06-24 DIAGNOSIS — G3185 Corticobasal degeneration: Secondary | ICD-10-CM | POA: Diagnosis not present

## 2018-06-24 DIAGNOSIS — F339 Major depressive disorder, recurrent, unspecified: Secondary | ICD-10-CM | POA: Diagnosis not present

## 2018-06-24 DIAGNOSIS — F0280 Dementia in other diseases classified elsewhere without behavioral disturbance: Secondary | ICD-10-CM | POA: Diagnosis not present

## 2018-06-25 DIAGNOSIS — F0280 Dementia in other diseases classified elsewhere without behavioral disturbance: Secondary | ICD-10-CM | POA: Diagnosis not present

## 2018-06-25 DIAGNOSIS — G4089 Other seizures: Secondary | ICD-10-CM | POA: Diagnosis not present

## 2018-06-25 DIAGNOSIS — G3185 Corticobasal degeneration: Secondary | ICD-10-CM | POA: Diagnosis not present

## 2018-06-25 DIAGNOSIS — F339 Major depressive disorder, recurrent, unspecified: Secondary | ICD-10-CM | POA: Diagnosis not present

## 2018-06-25 DIAGNOSIS — N390 Urinary tract infection, site not specified: Secondary | ICD-10-CM | POA: Diagnosis not present

## 2018-06-25 DIAGNOSIS — I10 Essential (primary) hypertension: Secondary | ICD-10-CM | POA: Diagnosis not present

## 2018-06-27 DIAGNOSIS — G3185 Corticobasal degeneration: Secondary | ICD-10-CM | POA: Diagnosis not present

## 2018-06-27 DIAGNOSIS — N390 Urinary tract infection, site not specified: Secondary | ICD-10-CM | POA: Diagnosis not present

## 2018-06-27 DIAGNOSIS — F0280 Dementia in other diseases classified elsewhere without behavioral disturbance: Secondary | ICD-10-CM | POA: Diagnosis not present

## 2018-06-27 DIAGNOSIS — F339 Major depressive disorder, recurrent, unspecified: Secondary | ICD-10-CM | POA: Diagnosis not present

## 2018-06-27 DIAGNOSIS — I10 Essential (primary) hypertension: Secondary | ICD-10-CM | POA: Diagnosis not present

## 2018-06-27 DIAGNOSIS — G4089 Other seizures: Secondary | ICD-10-CM | POA: Diagnosis not present

## 2018-07-24 DEATH — deceased

## 2018-08-20 ENCOUNTER — Ambulatory Visit: Payer: Self-pay | Admitting: Internal Medicine

## 2018-09-03 ENCOUNTER — Ambulatory Visit: Payer: Medicare Other | Admitting: Neurology

## 2019-03-06 ENCOUNTER — Encounter: Payer: Self-pay | Admitting: Internal Medicine
